# Patient Record
Sex: Male | Born: 1945 | Race: White | Hispanic: No | Marital: Married | Smoking: Current every day smoker
Health system: Southern US, Community
[De-identification: ages and names within clinical notes are randomized; demographics above are authoritative.]

## PROBLEM LIST (undated history)

## (undated) DIAGNOSIS — E7800 Pure hypercholesterolemia, unspecified: Secondary | ICD-10-CM

## (undated) DIAGNOSIS — I10 Essential (primary) hypertension: Secondary | ICD-10-CM

## (undated) DIAGNOSIS — D649 Anemia, unspecified: Secondary | ICD-10-CM

## (undated) DIAGNOSIS — F329 Major depressive disorder, single episode, unspecified: Secondary | ICD-10-CM

## (undated) DIAGNOSIS — H269 Unspecified cataract: Secondary | ICD-10-CM

## (undated) DIAGNOSIS — M1990 Unspecified osteoarthritis, unspecified site: Secondary | ICD-10-CM

## (undated) DIAGNOSIS — C61 Malignant neoplasm of prostate: Secondary | ICD-10-CM

## (undated) DIAGNOSIS — F32A Depression, unspecified: Secondary | ICD-10-CM

## (undated) DIAGNOSIS — F410 Panic disorder [episodic paroxysmal anxiety] without agoraphobia: Secondary | ICD-10-CM

## (undated) HISTORY — PX: CHOLECYSTECTOMY: SHX55

## (undated) HISTORY — DX: Unspecified osteoarthritis, unspecified site: M19.90

## (undated) HISTORY — DX: Depression, unspecified: F32.A

## (undated) HISTORY — PX: TOOTH EXTRACTION: SUR596

## (undated) HISTORY — DX: Panic disorder (episodic paroxysmal anxiety): F41.0

## (undated) HISTORY — DX: Unspecified cataract: H26.9

## (undated) HISTORY — DX: Malignant neoplasm of prostate: C61

## (undated) HISTORY — DX: Anemia, unspecified: D64.9

## (undated) HISTORY — PX: HERNIA REPAIR: SHX51

## (undated) HISTORY — DX: Pure hypercholesterolemia, unspecified: E78.00

## (undated) HISTORY — DX: Major depressive disorder, single episode, unspecified: F32.9

## (undated) HISTORY — PX: CATARACT EXTRACTION: SUR2

## (undated) HISTORY — DX: Essential (primary) hypertension: I10

---

## 2003-11-06 ENCOUNTER — Encounter: Payer: Self-pay | Admitting: Gastroenterology

## 2009-02-07 ENCOUNTER — Encounter: Payer: Self-pay | Admitting: Gastroenterology

## 2009-02-09 ENCOUNTER — Encounter: Payer: Self-pay | Admitting: Gastroenterology

## 2009-02-12 ENCOUNTER — Encounter: Payer: Self-pay | Admitting: Gastroenterology

## 2009-02-13 ENCOUNTER — Encounter: Payer: Self-pay | Admitting: Gastroenterology

## 2009-04-25 ENCOUNTER — Telehealth: Payer: Self-pay | Admitting: Gastroenterology

## 2009-04-26 ENCOUNTER — Ambulatory Visit: Payer: Self-pay | Admitting: Gastroenterology

## 2009-04-26 DIAGNOSIS — E538 Deficiency of other specified B group vitamins: Secondary | ICD-10-CM | POA: Insufficient documentation

## 2009-04-26 DIAGNOSIS — E782 Mixed hyperlipidemia: Secondary | ICD-10-CM | POA: Insufficient documentation

## 2009-04-26 DIAGNOSIS — K921 Melena: Secondary | ICD-10-CM | POA: Insufficient documentation

## 2009-04-26 DIAGNOSIS — I10 Essential (primary) hypertension: Secondary | ICD-10-CM | POA: Insufficient documentation

## 2009-04-26 DIAGNOSIS — E785 Hyperlipidemia, unspecified: Secondary | ICD-10-CM | POA: Insufficient documentation

## 2009-04-26 DIAGNOSIS — R634 Abnormal weight loss: Secondary | ICD-10-CM | POA: Insufficient documentation

## 2009-05-09 ENCOUNTER — Encounter: Payer: Self-pay | Admitting: Gastroenterology

## 2009-05-09 ENCOUNTER — Ambulatory Visit: Payer: Self-pay | Admitting: Gastroenterology

## 2009-05-11 ENCOUNTER — Encounter: Payer: Self-pay | Admitting: Gastroenterology

## 2011-11-05 ENCOUNTER — Ambulatory Visit (INDEPENDENT_AMBULATORY_CARE_PROVIDER_SITE_OTHER): Payer: MEDICARE | Admitting: Physician Assistant

## 2011-11-05 ENCOUNTER — Encounter: Payer: Self-pay | Admitting: Physician Assistant

## 2011-11-05 DIAGNOSIS — F411 Generalized anxiety disorder: Secondary | ICD-10-CM

## 2011-11-05 DIAGNOSIS — E7800 Pure hypercholesterolemia, unspecified: Secondary | ICD-10-CM

## 2011-11-05 DIAGNOSIS — D510 Vitamin B12 deficiency anemia due to intrinsic factor deficiency: Secondary | ICD-10-CM

## 2011-11-05 DIAGNOSIS — H6120 Impacted cerumen, unspecified ear: Secondary | ICD-10-CM

## 2011-11-05 DIAGNOSIS — Z0000 Encounter for general adult medical examination without abnormal findings: Secondary | ICD-10-CM

## 2011-11-05 DIAGNOSIS — I10 Essential (primary) hypertension: Secondary | ICD-10-CM

## 2011-11-05 LAB — POCT URINALYSIS DIPSTICK
Blood, UA: NEGATIVE
Ketones, UA: NEGATIVE
Leukocytes, UA: NEGATIVE
Protein, UA: NEGATIVE
pH, UA: 6

## 2011-11-05 LAB — CBC WITH DIFFERENTIAL/PLATELET
Basophils Absolute: 0 10*3/uL (ref 0.0–0.1)
Basophils Relative: 0 % (ref 0–1)
Eosinophils Absolute: 0.1 10*3/uL (ref 0.0–0.7)
Hemoglobin: 12.2 g/dL — ABNORMAL LOW (ref 13.0–17.0)
MCH: 26.3 pg (ref 26.0–34.0)
MCHC: 31.4 g/dL (ref 30.0–36.0)
Neutro Abs: 5.4 10*3/uL (ref 1.7–7.7)
Neutrophils Relative %: 70 % (ref 43–77)
Platelets: 333 10*3/uL (ref 150–400)
RDW: 16.9 % — ABNORMAL HIGH (ref 11.5–15.5)

## 2011-11-05 LAB — COMPREHENSIVE METABOLIC PANEL
AST: 21 U/L (ref 0–37)
Albumin: 4.3 g/dL (ref 3.5–5.2)
Alkaline Phosphatase: 58 U/L (ref 39–117)
Potassium: 4.1 mEq/L (ref 3.5–5.3)
Sodium: 138 mEq/L (ref 135–145)
Total Bilirubin: 0.5 mg/dL (ref 0.3–1.2)
Total Protein: 6.7 g/dL (ref 6.0–8.3)

## 2011-11-05 LAB — LIPID PANEL
HDL: 46 mg/dL (ref >39)
LDL Cholesterol: 156 mg/dL — ABNORMAL HIGH (ref 0–99)
Total CHOL/HDL Ratio: 4.7 Ratio
Triglycerides: 61 mg/dL (ref <150)
VLDL: 12 mg/dL (ref 0–40)

## 2011-11-05 MED ORDER — ROSUVASTATIN CALCIUM 20 MG PO TABS: 20.0000 mg | ORAL_TABLET | Freq: Every day | ORAL | Status: DC

## 2011-11-05 MED ORDER — ALPRAZOLAM 1 MG PO TABS: ORAL_TABLET | ORAL | Status: DC

## 2011-11-05 MED ORDER — CYANOCOBALAMIN 1000 MCG/ML IJ SOLN: INTRAMUSCULAR | Status: DC

## 2011-11-05 MED ORDER — ENALAPRIL-HYDROCHLOROTHIAZIDE 10-25 MG PO TABS: 1.0000 | ORAL_TABLET | Freq: Every day | ORAL | Status: DC

## 2011-11-05 NOTE — Progress Notes (Signed)
  Subjective:    Patient ID: Jackson Perry, male    DOB: 03/14/1946, 66 y.o.   MRN: [id number]  HPI  Here for CPE.  Doing well with B12 injections every 3 weeks.  Sometimes feels like they need to be closer together.  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 But, he is dealing with that ok now after a brief period of increased anxiety.  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  Getting a lot of enjoyment and fulfillment out of it.  Also enjoying his gardening.  He and his wife are doing well.  See scanned in form  Review of Systems  All other systems reviewed and are negative.       Objective:   Physical Exam  Nursing note and vitals reviewed. Constitutional: He is oriented to person, place, and time. He appears well-developed and well-nourished.  HENT:  Head: Normocephalic and atraumatic.  Right Ear: External ear normal.  Left Ear: External ear normal.  Nose: Nose normal.  Mouth/Throat: Oropharynx is clear and moist. No oropharyngeal exudate.       B ears were impacted with cerumen.  I was able to use currettes to successfully remove the wax.  Eyes: Conjunctivae are normal. Pupils are equal, round, and reactive to light.  Neck: Normal range of motion. Neck supple. No thyromegaly present.  Cardiovascular: Normal rate, regular rhythm and normal heart sounds.  Exam reveals no gallop and no friction rub.   No murmur heard. Pulmonary/Chest: Effort normal and breath sounds normal.  Abdominal: Soft. Bowel sounds are normal.  Genitourinary: Rectum normal, prostate normal and penis normal. Guaiac negative stool.       Small external hemorrhoids  Musculoskeletal: Normal range of motion. He exhibits no edema and no tenderness.  Lymphadenopathy:    He has no cervical adenopathy.  Neurological: He is alert and oriented to person, place, and time. He has normal reflexes. No cranial nerve deficit. Coordination normal.  Skin: Skin is warm and dry.  Psychiatric: He has a normal  mood and affect. His behavior is normal.   Results for orders placed in visit on 11/05/11  POCT URINALYSIS DIPSTICK      Component Value Range   Color, UA yellow     Clarity, UA clear     Glucose, UA neg     Bilirubin, UA neg     Ketones, UA neg     Spec Grav, UA 1.010     Blood, UA neg     pH, UA 6.0     Protein, UA neg     Urobilinogen, UA 0.2     Nitrite, UA neg     Leukocytes, UA Negative    IFOBT (OCCULT BLOOD)      Component Value Range   IFOBT Negative            Assessment & Plan:  Pernicious Anemia-checking labs.  May do injections every 17-21 days. HTN- adequate control-believe this would be better controlled with decreased alcohol intake and decreased salt in diet while continuing current meds.  Pt. Agrees to check BP out of office. Increased cholesterol-stable, continue crestor. Anxiety/insomnia-stable with xanax

## 2011-11-06 LAB — PSA: PSA: 1.64 ng/mL (ref <=4.00)

## 2011-11-08 LAB — VITAMIN D 1,25 DIHYDROXY
Vitamin D 1, 25 (OH)2 Total: 64 pg/mL (ref 18–72)
Vitamin D3 1, 25 (OH)2: 64 pg/mL

## 2011-11-09 ENCOUNTER — Telehealth: Payer: Self-pay | Admitting: Physician Assistant

## 2011-11-09 NOTE — Telephone Encounter (Signed)
Spoke with patient.  Re start cholesterol meds.  Hemoglobin a little low, B12 middle range normal.  Ok to do B12 injections closer together ~every 17 days.  See me in 9 months.

## 2012-01-28 ENCOUNTER — Ambulatory Visit (INDEPENDENT_AMBULATORY_CARE_PROVIDER_SITE_OTHER): Payer: Medicare Other | Admitting: Family Medicine

## 2012-01-28 VITALS — BP 112/58 | HR 76 | Temp 97.4°F | Resp 16 | Ht 67.5 in | Wt 151.0 lb

## 2012-01-28 DIAGNOSIS — M25579 Pain in unspecified ankle and joints of unspecified foot: Secondary | ICD-10-CM

## 2012-01-28 DIAGNOSIS — L84 Corns and callosities: Secondary | ICD-10-CM

## 2012-01-28 NOTE — Progress Notes (Signed)
  Patient Name: Jackson Perry Date of Birth: February 08, 1946 Medical Record Number: [id number] Gender: male Date of Encounter: 01/28/2012  History of Present Illness:  Jackson Perry is a 66 y.o. very pleasant male patient who presents with the following:  There is a corn or callus on his right 2nd toe where it rubs against the great toe.  He has tried OTC corn medications but has not resolved the problem.  The corn rubs against the great toe and is painful.     Patient Active Problem List  Diagnoses  . B12 DEFICIENCY  . HYPERLIPIDEMIA  . HYPERTENSION  . HEMOCCULT POSITIVE STOOL  . LOSS OF WEIGHT   No past medical history on file. No past surgical history on file. History  Substance Use Topics  . Smoking status: Former Smoker    Types: Cigarettes    Quit date: 11/05/2003  . Smokeless tobacco: Not on file  . Alcohol Use: Not on file   No family history on file. Allergies  Allergen Reactions  . Bupropion Hcl     Medication list has been reviewed and updated.  Review of Systems: As per HPI- otherwise negative.   Physical Examination: Filed Vitals:   01/28/12 1614  BP: 112/58  Pulse: 76  Temp: 97.4 F (36.3 C)  TempSrc: Oral  Resp: 16  Height: 5' 7.5" (1.715 m)  Weight: 151 lb (68.493 kg)    Body mass index is 23.30 kg/(m^2).   GEN: WDWN, NAD, Non-toxic, Alert & Oriented x 3 HEENT: Atraumatic, Normocephalic.  Ears and Nose: No external deformity. EXTR: No clubbing/cyanosis/edema NEURO: Normal gait.  PSYCH: Normally interactive. Conversant. Not depressed or anxious appearing.  Calm demeanor.  Right 2nd toe- there is a corn with thick callus on the medial sufrace.  Pared with a scalpel through dead tissue only- no bleeding- and then applied liquid nitrogen X3.  Also noted that the 2nd toe is becoming hyperextended and starting to cross over the top of the 3rd toe  Assessment and Plan: 1. Corn of toe    Pared and then applied liquid nitrogen to corn.   recommended that he consult with a podiatrist about the crossing of his toes as this may progress and cause problems later.   Please come back if corn needs further treatment, or if any signs of infection develop

## 2012-02-03 ENCOUNTER — Other Ambulatory Visit: Payer: Self-pay | Admitting: Physician Assistant

## 2012-02-03 MED ORDER — ENALAPRIL-HYDROCHLOROTHIAZIDE 10-25 MG PO TABS: 1.0000 | ORAL_TABLET | Freq: Every day | ORAL | Status: DC

## 2012-02-04 ENCOUNTER — Other Ambulatory Visit: Payer: Self-pay | Admitting: Family Medicine

## 2012-02-04 MED ORDER — ENALAPRIL-HYDROCHLOROTHIAZIDE 10-25 MG PO TABS: 1.0000 | ORAL_TABLET | Freq: Every day | ORAL | Status: DC

## 2012-04-29 ENCOUNTER — Other Ambulatory Visit: Payer: Self-pay | Admitting: Family Medicine

## 2012-04-29 MED ORDER — ALPRAZOLAM 1 MG PO TABS: ORAL_TABLET | ORAL | Status: DC

## 2012-12-08 ENCOUNTER — Telehealth: Payer: Self-pay

## 2012-12-08 MED ORDER — ALPRAZOLAM 1 MG PO TABS: ORAL_TABLET | ORAL | Status: DC

## 2012-12-08 MED ORDER — ROSUVASTATIN CALCIUM 20 MG PO TABS: 20.0000 mg | ORAL_TABLET | Freq: Every day | ORAL | Status: DC

## 2012-12-08 MED ORDER — ENALAPRIL-HYDROCHLOROTHIAZIDE 10-25 MG PO TABS: 1.0000 | ORAL_TABLET | Freq: Every day | ORAL | Status: DC

## 2012-12-08 MED ORDER — CYANOCOBALAMIN 1000 MCG/ML IJ SOLN: INTRAMUSCULAR | Status: DC

## 2012-12-08 NOTE — Telephone Encounter (Signed)
Xanax rx printed.  Pt will need to discuss with Dr. Clelia Croft at physical.  May call for 1 RF if needed before appointment

## 2012-12-08 NOTE — Telephone Encounter (Signed)
Have sent in meds except Xanax, pended please advise.

## 2012-12-08 NOTE — Telephone Encounter (Signed)
Faxed, then had to res[facility] them to [facility], sent to [facility] in error. Cancelled at [employer].

## 2012-12-08 NOTE — Telephone Encounter (Signed)
Pt was scheduled to see angela mcclung for a CPE, but has been cancelled due to her leaving. CPE has been rescheduled for 01/28/13 with Dr Clelia Croft. But pt states he needs refills on all meds, until he can get in for CPE.  Needs refill on Crestor, veseretic, xanax, B-12 and syringes.  Pt uses [facility]

## 2012-12-09 ENCOUNTER — Telehealth: Payer: Self-pay

## 2012-12-09 ENCOUNTER — Other Ambulatory Visit: Payer: Self-pay | Admitting: Radiology

## 2012-12-09 MED ORDER — ROSUVASTATIN CALCIUM 20 MG PO TABS: 20.0000 mg | ORAL_TABLET | Freq: Every day | ORAL | Status: DC

## 2012-12-09 MED ORDER — CYANOCOBALAMIN 1000 MCG/ML IJ SOLN: INTRAMUSCULAR | Status: DC

## 2012-12-09 NOTE — Telephone Encounter (Signed)
PT STATES HE WAS GIVEN A SCRIPT FOR THE B-12 INJECTION AND CANNOT FIND A PHARMACY THAT HAVE IT. HE GOES TO [facility] AND WAS TOLD THEY WERE OUT AND WANTED HIM TO CALL us PLEASE CALL [phone]

## 2012-12-09 NOTE — Telephone Encounter (Signed)
Sent in for him to [facility], if they do not have it he can come here.

## 2012-12-29 ENCOUNTER — Encounter: Payer: Medicare Other | Admitting: Physician Assistant

## 2013-01-19 ENCOUNTER — Telehealth: Payer: Self-pay

## 2013-01-19 MED ORDER — ENALAPRIL-HYDROCHLOROTHIAZIDE 10-25 MG PO TABS: 1.0000 | ORAL_TABLET | Freq: Every day | ORAL | Status: DC

## 2013-01-19 MED ORDER — CYANOCOBALAMIN 1000 MCG/ML IJ SOLN: INTRAMUSCULAR | Status: DC

## 2013-01-19 MED ORDER — ROSUVASTATIN CALCIUM 20 MG PO TABS: 20.0000 mg | ORAL_TABLET | Freq: Every day | ORAL | Status: DC

## 2013-01-19 NOTE — Telephone Encounter (Signed)
Sent these in called patient to advise.

## 2013-01-19 NOTE — Telephone Encounter (Signed)
Former pt of Marylene Land (had appt with her for April but we cancelled). Rescheduled with Dr. Clelia Croft for May 23. I had to reschedule him for 5/30 (CPE) as her clinic is closing early on 5/23. Needs meds to get thru til 5/30 B12 (10mg  bottle please, the 1 mg more expensive) BP med Crestor  [facility] on [address]  Please call pt when done to confirm  [phone]  Thanks.

## 2013-01-28 ENCOUNTER — Encounter: Payer: Medicare Other | Admitting: Family Medicine

## 2013-02-04 ENCOUNTER — Encounter: Payer: Self-pay | Admitting: Family Medicine

## 2013-02-04 ENCOUNTER — Ambulatory Visit (INDEPENDENT_AMBULATORY_CARE_PROVIDER_SITE_OTHER): Payer: Medicare Other | Admitting: Family Medicine

## 2013-02-04 VITALS — BP 132/73 | HR 77 | Temp 98.7°F | Resp 16 | Ht 68.5 in | Wt 149.0 lb

## 2013-02-04 DIAGNOSIS — IMO0001 Reserved for inherently not codable concepts without codable children: Secondary | ICD-10-CM

## 2013-02-04 DIAGNOSIS — E538 Deficiency of other specified B group vitamins: Secondary | ICD-10-CM

## 2013-02-04 DIAGNOSIS — Z0000 Encounter for general adult medical examination without abnormal findings: Secondary | ICD-10-CM

## 2013-02-04 DIAGNOSIS — E785 Hyperlipidemia, unspecified: Secondary | ICD-10-CM

## 2013-02-04 DIAGNOSIS — R5381 Other malaise: Secondary | ICD-10-CM

## 2013-02-04 DIAGNOSIS — Z23 Encounter for immunization: Secondary | ICD-10-CM

## 2013-02-04 DIAGNOSIS — Z139 Encounter for screening, unspecified: Secondary | ICD-10-CM

## 2013-02-04 DIAGNOSIS — Z125 Encounter for screening for malignant neoplasm of prostate: Secondary | ICD-10-CM

## 2013-02-04 DIAGNOSIS — G4700 Insomnia, unspecified: Secondary | ICD-10-CM

## 2013-02-04 LAB — POCT UA - MICROSCOPIC ONLY
Bacteria, U Microscopic: NEGATIVE
WBC, Ur, HPF, POC: NEGATIVE

## 2013-02-04 LAB — POCT URINALYSIS DIPSTICK
Bilirubin, UA: NEGATIVE
Blood, UA: NEGATIVE
Glucose, UA: NEGATIVE
Spec Grav, UA: 1.02

## 2013-02-04 LAB — VITAMIN B12: Vitamin B-12: 565 pg/mL (ref 211–911)

## 2013-02-04 LAB — LIPID PANEL
HDL: 48 mg/dL (ref >39)
LDL Cholesterol: 166 mg/dL — ABNORMAL HIGH (ref 0–99)
Total CHOL/HDL Ratio: 4.8 Ratio
Triglycerides: 69 mg/dL (ref <150)
VLDL: 14 mg/dL (ref 0–40)

## 2013-02-04 LAB — CBC WITH DIFFERENTIAL/PLATELET
Eosinophils Relative: 1 % (ref 0–5)
HCT: 39.1 % (ref 39.0–52.0)
Hemoglobin: 12.8 g/dL — ABNORMAL LOW (ref 13.0–17.0)
Lymphocytes Relative: 20 % (ref 12–46)
MCV: 82.8 fL (ref 78.0–100.0)
Monocytes Absolute: 0.4 10*3/uL (ref 0.1–1.0)
Monocytes Relative: 6 % (ref 3–12)
Neutro Abs: 4.8 10*3/uL (ref 1.7–7.7)
RDW: 16.5 % — ABNORMAL HIGH (ref 11.5–15.5)
WBC: 6.6 10*3/uL (ref 4.0–10.5)

## 2013-02-04 LAB — IFOBT (OCCULT BLOOD): IFOBT: NEGATIVE

## 2013-02-04 LAB — COMPREHENSIVE METABOLIC PANEL
AST: 17 U/L (ref 0–37)
Alkaline Phosphatase: 59 U/L (ref 39–117)
Glucose, Bld: 102 mg/dL — ABNORMAL HIGH (ref 70–99)
Sodium: 138 mEq/L (ref 135–145)
Total Bilirubin: 0.5 mg/dL (ref 0.3–1.2)
Total Protein: 6.7 g/dL (ref 6.0–8.3)

## 2013-02-04 LAB — TSH: TSH: 1.754 u[IU]/mL (ref 0.350–4.500)

## 2013-02-04 MED ORDER — ENALAPRIL-HYDROCHLOROTHIAZIDE 10-25 MG PO TABS: 1.0000 | ORAL_TABLET | Freq: Every day | ORAL | Status: DC

## 2013-02-04 MED ORDER — ALPRAZOLAM 1 MG PO TABS: ORAL_TABLET | ORAL | Status: DC

## 2013-02-04 MED ORDER — CYANOCOBALAMIN 1000 MCG/ML IJ SOLN: INTRAMUSCULAR | Status: DC

## 2013-02-04 NOTE — Patient Instructions (Addendum)

## 2013-02-04 NOTE — Progress Notes (Addendum)
Subjective:    Jackson Perry is a 67 y.o. male who presents for Medicare Annual/Subsequent preventive examination.   Preventive Screening-Counseling & Management  Tobacco History  Smoking status  . Former Smoker  . Types: Cigarettes  . Quit date: 11/05/2003  Smokeless tobacco  . Not on file    Problems Prior to Visit 1. Stopped taking Crestor - was having muscle spasms in legs at night - had been on the crestor for a long time but when he stopped it his symptoms went away. He stopped in 6-8 wks ago. 2. B12 def - feels like it is not doing job  - lightheadedness, tiredness and weakness in legs, Very exhusted he can't move so sometimes needing to take an injection early. 3.  bilateral pelvic pains - now resolved after having a firm BM -- feels occ LLQ presure - esp after eating. 4.. H/o shingles - long time ago, has not had shingles vaccine.  Current Problems (verified) Patient Active Problem List   Diagnosis Date Noted  . B12 DEFICIENCY 04/26/2009  . HYPERLIPIDEMIA 04/26/2009  . HYPERTENSION 04/26/2009  . HEMOCCULT POSITIVE STOOL 04/26/2009  . LOSS OF WEIGHT 04/26/2009    Medications Prior to Visit Current Outpatient Prescriptions on File Prior to Visit  Medication Sig Dispense Refill  . ALPRAZolam (XANAX) 1 MG tablet 1/2-1 tablet hs as needed.  30 tablet  0  . aspirin 81 MG tablet Take 81 mg by mouth daily.      . cyanocobalamin (,VITAMIN B-12,) 1000 MCG/ML injection Inject 1 ml every 17-21 days. Please dispense enough syringes and needles.  10 mL  0  . enalapril-hydrochlorothiazide (VASERETIC) 10-25 MG per tablet Take 1 tablet by mouth daily.  30 tablet  0  . rosuvastatin (CRESTOR) 20 MG tablet Take 1 tablet (20 mg total) by mouth daily. Pt takes 1/2 tablet daily.  15 tablet  0   No current facility-administered medications on file prior to visit.    Current Medications (verified) Current Outpatient Prescriptions  Medication Sig Dispense Refill  . ALPRAZolam (XANAX)  1 MG tablet 1/2-1 tablet hs as needed.  30 tablet  0  . aspirin 81 MG tablet Take 81 mg by mouth daily.      . cyanocobalamin (,VITAMIN B-12,) 1000 MCG/ML injection Inject 1 ml every 17-21 days. Please dispense enough syringes and needles.  10 mL  0  . enalapril-hydrochlorothiazide (VASERETIC) 10-25 MG per tablet Take 1 tablet by mouth daily.  30 tablet  0  . rosuvastatin (CRESTOR) 20 MG tablet Take 1 tablet (20 mg total) by mouth daily. Pt takes 1/2 tablet daily.  15 tablet  0   No current facility-administered medications for this visit.     Allergies (verified) Bupropion hcl   PAST HISTORY  Family History No family history on file.  Social History History  Substance Use Topics  . Smoking status: Former Smoker    Types: Cigarettes    Quit date: 11/05/2003  . Smokeless tobacco: Not on file  . Alcohol Use: Not on file    Are there smokers in your home (other than you)?  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  Depression Screen (Note: if answer to either of the following is "Yes", a more complete depression screening is indicated)   Q1: Over  the past two weeks, have you felt down, depressed or hopeless? No  Q2: Over the past two weeks, have you felt little interest or pleasure in doing things? No  Have you lost interest or pleasure in daily life? No  Do you often feel hopeless? No  Do you cry easily over simple problems? No  Activities of Daily Living In your present state of health, do you have any difficulty performing the following activities?:  Driving? No Managing money?  No Feeding yourself? No Getting from bed to chair? No Climbing a flight of stairs? No Preparing food and eating?: No Bathing or showering? No Getting dressed: No Getting  to the toilet? No Using the toilet:No Moving around from place to place: No In the past year have you fallen or had a near fall?:No   Are you sexually active?  Yes  Do you have more than one partner?  No  Hearing Difficulties: No Do you often ask people to speak up or repeat themselves? No Do you experience ringing or noises in your ears? No Do you have difficulty understanding soft or whispered voices? No   Do you feel that you have a problem with memory? No  Do you often misplace items? No  Do you feel safe at home?  Yes  Cognitive Testing  Alert? Yes  Normal Appearance?Yes  Oriented to person? Yes  Place? Yes   Time? Yes  Recall of three objects?  Yes  Can perform simple calculations? Yes  Displays appropriate judgment?Yes  Can read the correct time from a watch face?Yes   Advanced Directives have been discussed with the patient? Yes   List the Names of Other Physician/Practitioners you currently use: 1.  none  Indicate any recent Medical Services you may have received from other than [facility] providers in the past year (date may be approximate).   There is no immunization history on file for this patient.  Screening Tests Health Maintenance  Topic Date Due  . Tetanus/tdap  04/24/1964  . Colonoscopy  04/25/1995  . Zostavax  04/24/2005  . Pneumococcal Polysaccharide Vaccine Age 68 And Over  04/24/2010  . Influenza Vaccine  05/09/2013    All answers were reviewed with the patient and necessary referrals were made:  Zuleyma Scharf, MD   02/04/2013   History reviewed: allergies, current medications, past family history, past medical history, past social history, past surgical history and problem list  Review of Systems General/Constitutional: positive for appetite change and fatigue Genitourinary:positive for flank pain and urinary frequency Musculoskeletal:positive for myalgias Neurological: positive for dizziness and lightheadedness    Objective:     Vision by  Snellen chart: right eye:20/25, left eye:20/30 Blood pressure 132/73, pulse 77, temperature 98.7 F (37.1 C), resp. rate 16, height 5' 8.5" (1.74 m), weight 149 lb (67.586 kg). Body mass index is 22.32 kg/(m^2).  BP 132/73  Pulse 77  Temp(Src) 98.7 F (37.1 C)  Resp 16  Ht 5' 8.5" (1.74 m)  Wt 149 lb (67.586 kg)  BMI 22.32 kg/m2  General Appearance:    Alert, cooperative, no distress, appears stated age  Head:    Normocephalic, without obvious abnormality, atraumatic  Eyes:    PERRL, conjunctiva/corneas clear, EOM's intact, fundi    benign, both eyes       Ears:    Normal TM's and external ear canals, both ears  Nose:   Nares normal, septum midline, mucosa normal, no drainage    or sinus tenderness  Throat:   Lips, mucosa,  and tongue normal; teeth and gums normal  Neck:   Supple, symmetrical, trachea midline, no adenopathy;       thyroid:  No enlargement/tenderness/nodules; no carotid   bruit or JVD  Back:     Symmetric, no curvature, ROM normal, no CVA tenderness  Lungs:     Clear to auscultation bilaterally, respirations unlabored  Chest wall:    No tenderness or deformity  Heart:    Regular rate and rhythm, S1 and S2 normal, no murmur, rub   or gallop  Abdomen:     Soft, non-tender, bowel sounds active all four quadrants,    no masses, no organomegaly  Genitalia:    Normal male without lesion, discharge or tenderness.  Left reducible inguinal hernia.  Rectal:    Normal tone, normal prostate, no masses or tenderness;   guaiac negative stool  Extremities:   Extremities normal, atraumatic, no cyanosis or edema  Pulses:   2+ and symmetric all extremities  Skin:   Skin color, texture, turgor normal, no rashes or lesions  Lymph nodes:   Cervical, supraclavicular, and axillary nodes normal  Neurologic:   CNII-XII intact. Normal strength, sensation and reflexes      throughout       Assessment:     CPE and annual medicare wellness exam done today      Plan:     During  the course of the visit the patient was educated and counseled about appropriate screening and preventive services including:    Pneumococcal vaccine - done today  Td vaccine - done today  Screening electrocardiogram - done at prev physical and was abnml per pt  Prostate cancer screening - done today  Colorectal cancer screening - done 05/09/2009 - recheck in 05/2014 due to hyperplastic polyps  Advanced directives: power of attorney for healthcare on file rx given for zostavax.  Diet review for nutrition referral? Yes ____  Not Indicated __X__  1. HPL - will do trial off of crestor as prior lipids well controlled and pt's only risk factors are age, male, and well controlled HTN. Crestor was causing myalgias. Recheck flp in 4 mos to see if needs to retry other statin.  2. Vit b12 def - OK to increase self-administered injection to q2 wks as he as been feeling level is low and had to do that w/ last inj - recheck level today, cons checking methylmalonic acid level.  If shortage cont, may have to do trial of oral.  3. Anxiety - well controlled on rare prn xanax  4. Flank/pelvic pain - rec abdominal US if recurs.   Patient Instructions (the written plan) was given to the patient.  Medicare Attestation I have personally reviewed: The patient's medical and social history Their use of alcohol, tobacco or illicit drugs Their current medications and supplements The patient's functional ability including ADLs,fall risks, home safety risks, cognitive, and hearing and visual impairment Diet and physical activities Evidence for depression or mood disorders  The patient's weight, height, BMI, and visual acuity have been recorded in the chart.  I have made referrals, counseling, and provided education to the patient based on review of the above and I have provided the patient with a written personalized care plan for preventive services.     Jackson Sorenson, MD   02/04/2013

## 2013-02-04 NOTE — Progress Notes (Deleted)
  Subjective:    Patient ID: Jackson Perry, male    DOB: September 30, 1945, 67 y.o.   MRN: [id number]  HPI    Review of Systems  Constitutional: Negative.   HENT: Negative.   Eyes: Negative.   Respiratory: Negative.   Cardiovascular: Negative.   Gastrointestinal: Negative.   Endocrine: Negative.   Genitourinary: Positive for flank pain.  Musculoskeletal: Positive for myalgias.  Neurological: Positive for dizziness and light-headedness.  Hematological: Negative.   Psychiatric/Behavioral: Negative.        Objective:   Physical Exam        Assessment & Plan:

## 2013-02-09 LAB — METHYLMALONIC ACID, SERUM: Methylmalonic Acid, Quant: 0.09 umol/L (ref <0.40)

## 2013-02-20 ENCOUNTER — Other Ambulatory Visit: Payer: Self-pay | Admitting: Family Medicine

## 2013-02-20 DIAGNOSIS — E785 Hyperlipidemia, unspecified: Secondary | ICD-10-CM

## 2013-02-20 MED ORDER — PRAVASTATIN SODIUM 40 MG PO TABS: 40.0000 mg | ORAL_TABLET | Freq: Every day | ORAL | Status: DC

## 2013-03-02 ENCOUNTER — Ambulatory Visit
Admission: RE | Admit: 2013-03-02 | Discharge: 2013-03-02 | Disposition: A | Discharge status: Home / Self Care | Payer: Medicare Other | Source: Ambulatory Visit | Attending: Family Medicine | Admitting: Family Medicine

## 2013-03-02 DIAGNOSIS — Z0000 Encounter for general adult medical examination without abnormal findings: Secondary | ICD-10-CM

## 2013-07-14 ENCOUNTER — Other Ambulatory Visit: Payer: Self-pay

## 2013-12-05 ENCOUNTER — Ambulatory Visit (INDEPENDENT_AMBULATORY_CARE_PROVIDER_SITE_OTHER): Payer: Medicare Other | Admitting: Emergency Medicine

## 2013-12-05 VITALS — BP 108/60 | HR 74 | Temp 98.1°F | Resp 16 | Ht 68.0 in | Wt 151.6 lb

## 2013-12-05 DIAGNOSIS — Z808 Family history of malignant neoplasm of other organs or systems: Secondary | ICD-10-CM

## 2013-12-05 DIAGNOSIS — G2581 Restless legs syndrome: Secondary | ICD-10-CM

## 2013-12-05 DIAGNOSIS — B029 Zoster without complications: Secondary | ICD-10-CM

## 2013-12-05 NOTE — Patient Instructions (Signed)
Shingles Shingles (herpes zoster) is an infection that is caused by the same virus that causes chickenpox (varicella). The infection causes a painful skin rash and fluid-filled blisters, which eventually break open, crust over, and heal. It may occur in any area of the body, but it usually affects only one side of the body or face. The pain of shingles usually lasts about 1 month. However, some people with shingles may develop long-term (chronic) pain in the affected area of the body. Shingles often occurs many years after the person had chickenpox. It is more common:  In people older than 50 years.  In people with weakened immune systems, such as those with HIV, AIDS, or cancer.  In people taking medicines that weaken the immune system, such as transplant medicines.  In people under great stress. CAUSES  Shingles is caused by the varicella zoster virus (VZV), which also causes chickenpox. After a person is infected with the virus, it can remain in the person's body for years in an inactive state (dormant). To cause shingles, the virus reactivates and breaks out as an infection in a nerve root. The virus can be spread from person to person (contagious) through contact with open blisters of the shingles rash. It will only spread to people who have not had chickenpox. When these people are exposed to the virus, they may develop chickenpox. They will not develop shingles. Once the blisters scab over, the person is no longer contagious and cannot spread the virus to others. SYMPTOMS  Shingles shows up in stages. The initial symptoms may be pain, itching, and tingling in an area of the skin. This pain is usually described as burning, stabbing, or throbbing.In a few days or weeks, a painful red rash will appear in the area where the pain, itching, and tingling were felt. The rash is usually on one side of the body in a band or belt-like pattern. Then, the rash usually turns into fluid-filled blisters. They  will scab over and dry up in approximately 2 3 weeks. Flu-like symptoms may also occur with the initial symptoms, the rash, or the blisters. These may include:  Fever.  Chills.  Headache.  Upset stomach. DIAGNOSIS  Your caregiver will perform a skin exam to diagnose shingles. Skin scrapings or fluid samples may also be taken from the blisters. This sample will be examined under a microscope or sent to a lab for further testing. TREATMENT  There is no specific cure for shingles. Your caregiver will likely prescribe medicines to help you manage the pain, recover faster, and avoid long-term problems. This may include antiviral drugs, anti-inflammatory drugs, and pain medicines. HOME CARE INSTRUCTIONS   Take a cool bath or apply cool compresses to the area of the rash or blisters as directed. This may help with the pain and itching.   Only take over-the-counter or prescription medicines as directed by your caregiver.   Rest as directed by your caregiver.  Keep your rash and blisters clean with mild soap and cool water or as directed by your caregiver.  Do not pick your blisters or scratch your rash. Apply an anti-itch cream or numbing creams to the affected area as directed by your caregiver.  Keep your shingles rash covered with a loose bandage (dressing).  Avoid skin contact with:  Babies.   Pregnant women.   Children with eczema.   Elderly people with transplants.   People with chronic illnesses, such as leukemia or AIDS.   Wear loose-fitting clothing to help ease   the pain of material rubbing against the rash.  Keep all follow-up appointments with your caregiver.If the area involved is on your face, you may receive a referral for follow-up to a specialist, such as an eye doctor (ophthalmologist) or an ear, nose, and throat (ENT) doctor. Keeping all follow-up appointments will help you avoid eye complications, chronic pain, or disability.  SEEK IMMEDIATE MEDICAL  CARE IF:   You have facial pain, pain around the eye area, or loss of feeling on one side of your face.  You have ear pain or ringing in your ear.  You have loss of taste.  Your pain is not relieved with prescribed medicines.   Your redness or swelling spreads.   You have more pain and swelling.  Your condition is worsening or has changed.   You have a feveror persistent symptoms for more than 2 3 days.  You have a fever and your symptoms suddenly get worse. MAKE SURE YOU:  Understand these instructions.  Will watch your condition.  Will get help right away if you are not doing well or get worse. Document Released: 08/25/2005 Document Revised: 05/19/2012 Document Reviewed: 04/08/2012 [employer] Patient Information 2014 [employer].  

## 2013-12-05 NOTE — Progress Notes (Signed)
[facility] [address] [place] [address] [phone]  Date:  12/05/2013   Name:  Jackson Perry   DOB:  Feb 22, 1946   MRN:  [id number]  PCP:  Mikey Kirschner    Chief Complaint: Rash   History of Present Illness:  Jackson Perry is a 68 y.o. very pleasant male patient who presents with the following:  Brothers have been diagnosed with malignant melanoma.  Requests referral.  Has a rash that is burning and pruritic on left upper back for over a week. Initially vesicular and now not.  No fever or chills.  No shingles shot.  No improvement with over the counter medications or other home remedies. Marland Kitchenjds   Patient Active Problem List   Diagnosis Date Noted  . B12 DEFICIENCY 04/26/2009  . HYPERLIPIDEMIA 04/26/2009  . HYPERTENSION 04/26/2009  . HEMOCCULT POSITIVE STOOL 04/26/2009  . LOSS OF WEIGHT 04/26/2009    Past Medical History  Diagnosis Date  . Depression   . Anemia     Past Surgical History  Procedure Laterality Date  . Cholecystectomy      History  Substance Use Topics  . Smoking status: Former Smoker    Types: Cigarettes    Quit date: 11/05/2003  . Smokeless tobacco: Not on file  . Alcohol Use: Not on file    Family History  Problem Relation Age of Onset  . Heart disease Father   . Diabetes Father   . Cancer Father     Allergies  Allergen Reactions  . Bupropion Hcl     Medication list has been reviewed and updated.  Current Outpatient Prescriptions on File Prior to Visit  Medication Sig Dispense Refill  . aspirin 81 MG tablet Take 81 mg by mouth daily.      . cyanocobalamin (,VITAMIN B-12,) 1000 MCG/ML injection Inject 1 ml every 14 days. Please dispense enough syringes and needles.  10 mL  0  . enalapril-hydrochlorothiazide (VASERETIC) 10-25 MG per tablet Take 1 tablet by mouth daily.  90 tablet  3  . pravastatin (PRAVACHOL) 40 MG tablet Take 1 tablet (40 mg total) by mouth daily.  90 tablet  1   No current  facility-administered medications on file prior to visit.    Review of Systems:  As per HPI, otherwise negative.    Physical Examination: Filed Vitals:   12/05/13 1555  BP: 108/60  Pulse: 74  Temp: 98.1 F (36.7 C)  Resp: 16   Filed Vitals:   12/05/13 1555  Height: 5\' 8"  (1.727 m)  Weight: 151 lb 9.6 oz (68.765 kg)   Body mass index is 23.06 kg/(m^2). Ideal Body Weight: Weight in (lb) to have BMI = 25: 164.1   GEN: WDWN, NAD, Non-toxic, Alert & Oriented x 3 HEENT: Atraumatic, Normocephalic.  Ears and Nose: No external deformity. EXTR: No clubbing/cyanosis/edema NEURO: Normal gait.  PSYCH: Normally interactive. Conversant. Not depressed or anxious appearing.  Calm demeanor.  SKIN:  Rash on left upper back consistent with shingles   Assessment and Plan: Shingles Shingles vaccine Too late to start valtrex  Signed,  Ellison Carwin, MD

## 2013-12-05 NOTE — Addendum Note (Signed)
Addended by: Roselee Culver on: 12/05/2013 05:47 PM   Modules accepted: Orders

## 2014-01-09 ENCOUNTER — Other Ambulatory Visit: Payer: Self-pay | Admitting: Family Medicine

## 2014-02-10 ENCOUNTER — Encounter: Payer: Self-pay | Admitting: Family Medicine

## 2014-02-10 ENCOUNTER — Ambulatory Visit (INDEPENDENT_AMBULATORY_CARE_PROVIDER_SITE_OTHER): Payer: Medicare Other | Admitting: Family Medicine

## 2014-02-10 VITALS — BP 130/82 | HR 80 | Temp 98.2°F | Resp 20 | Ht 68.0 in | Wt 152.8 lb

## 2014-02-10 DIAGNOSIS — Z125 Encounter for screening for malignant neoplasm of prostate: Secondary | ICD-10-CM

## 2014-02-10 DIAGNOSIS — Z0000 Encounter for general adult medical examination without abnormal findings: Secondary | ICD-10-CM

## 2014-02-10 DIAGNOSIS — B354 Tinea corporis: Secondary | ICD-10-CM

## 2014-02-10 DIAGNOSIS — R21 Rash and other nonspecific skin eruption: Secondary | ICD-10-CM

## 2014-02-10 DIAGNOSIS — E785 Hyperlipidemia, unspecified: Secondary | ICD-10-CM

## 2014-02-10 DIAGNOSIS — I10 Essential (primary) hypertension: Secondary | ICD-10-CM

## 2014-02-10 DIAGNOSIS — E538 Deficiency of other specified B group vitamins: Secondary | ICD-10-CM

## 2014-02-10 LAB — COMPLETE METABOLIC PANEL WITH GFR
ALT: 16 U/L (ref 0–53)
AST: 16 U/L (ref 0–37)
Albumin: 3.9 g/dL (ref 3.5–5.2)
Alkaline Phosphatase: 58 U/L (ref 39–117)
BUN: 12 mg/dL (ref 6–23)
CALCIUM: 8.9 mg/dL (ref 8.4–10.5)
CO2: 25 meq/L (ref 19–32)
CREATININE: 0.92 mg/dL (ref 0.50–1.35)
Chloride: 103 mEq/L (ref 96–112)
GFR, Est Non African American: 85 mL/min
Glucose, Bld: 99 mg/dL (ref 70–99)
Potassium: 4.3 mEq/L (ref 3.5–5.3)
Sodium: 140 mEq/L (ref 135–145)
Total Bilirubin: 0.4 mg/dL (ref 0.2–1.2)
Total Protein: 6.5 g/dL (ref 6.0–8.3)

## 2014-02-10 LAB — CBC WITH DIFFERENTIAL/PLATELET
BASOS ABS: 0 10*3/uL (ref 0.0–0.1)
Basophils Relative: 0 % (ref 0–1)
EOS ABS: 0.1 10*3/uL (ref 0.0–0.7)
Eosinophils Relative: 1 % (ref 0–5)
HCT: 39 % (ref 39.0–52.0)
Hemoglobin: 12.7 g/dL — ABNORMAL LOW (ref 13.0–17.0)
LYMPHS PCT: 18 % (ref 12–46)
Lymphs Abs: 1.6 10*3/uL (ref 0.7–4.0)
MCH: 27.8 pg (ref 26.0–34.0)
MCHC: 32.6 g/dL (ref 30.0–36.0)
MCV: 85.3 fL (ref 78.0–100.0)
Monocytes Absolute: 0.5 10*3/uL (ref 0.1–1.0)
Monocytes Relative: 6 % (ref 3–12)
NEUTROS PCT: 75 % (ref 43–77)
Neutro Abs: 6.8 10*3/uL (ref 1.7–7.7)
PLATELETS: 305 10*3/uL (ref 150–400)
RBC: 4.57 MIL/uL (ref 4.22–5.81)
RDW: 16.4 % — ABNORMAL HIGH (ref 11.5–15.5)
WBC: 9.1 10*3/uL (ref 4.0–10.5)

## 2014-02-10 LAB — POCT SKIN KOH: Skin KOH, POC: NEGATIVE

## 2014-02-10 LAB — LIPID PANEL
Cholesterol: 180 mg/dL (ref 0–200)
HDL: 35 mg/dL — AB (ref >39)
LDL Cholesterol: 133 mg/dL — ABNORMAL HIGH (ref 0–99)
Total CHOL/HDL Ratio: 5.1 Ratio
Triglycerides: 58 mg/dL (ref <150)
VLDL: 12 mg/dL (ref 0–40)

## 2014-02-10 MED ORDER — CYANOCOBALAMIN 1000 MCG/ML IJ SOLN: INTRAMUSCULAR | Status: DC

## 2014-02-10 MED ORDER — ENALAPRIL-HYDROCHLOROTHIAZIDE 10-25 MG PO TABS: 1.0000 | ORAL_TABLET | Freq: Every day | ORAL | Status: DC

## 2014-02-10 MED ORDER — TERBINAFINE HCL 250 MG PO TABS
250.0000 mg | ORAL_TABLET | Freq: Every day | ORAL | Status: DC
Start: 2014-02-10 — End: 2015-07-10

## 2014-02-10 MED ORDER — ALPRAZOLAM 1 MG PO TABS: 1.0000 mg | ORAL_TABLET | Freq: Two times a day (BID) | ORAL | Status: DC | PRN

## 2014-02-10 MED ORDER — KETOCONAZOLE 2 % EX CREA: 1.0000 "application " | TOPICAL_CREAM | Freq: Every day | CUTANEOUS | Status: DC

## 2014-02-10 NOTE — Patient Instructions (Addendum)
You are do for the new Prevnar vaccine - but we ran out of it today.  We will plan to give it to you on your next routine OV or when you come in for your flu shot this fall. You will be due for your colonoscopy in September - your GI doctor's office (Dr. Sharlett Iles) should contact you to schedule this soon. Go get your shingles vaccine at the pharmacy!  If the fungal treatment doesn't work, then we should try a stronger steroid treatment and consider getting you back in to see the dermatologist.  Keeping you healthy  Get these tests  Blood pressure- Have your blood pressure checked once a year by your healthcare provider.  Normal blood pressure is 120/80  Weight- Have your body mass index (BMI) calculated to screen for obesity.  BMI is a measure of body fat based on height and weight. You can also calculate your own BMI at ViewBanking.si.  Cholesterol- Have your cholesterol checked every year.  Diabetes- Have your blood sugar checked regularly if you have high blood pressure, high cholesterol, have a family history of diabetes or if you are overweight.  Screening for Colon Cancer- Colonoscopy starting at age 5.  Screening may begin sooner depending on your family history and other health conditions. Follow up colonoscopy as directed by your Gastroenterologist.  Screening for Prostate Cancer- Both blood work (PSA) and a rectal exam help screen for Prostate Cancer.  Screening begins at age 49 with African-American men and at age 21 with Caucasian men.  Screening may begin sooner depending on your family history.  Take these medicines  Aspirin- One aspirin daily can help prevent Heart disease and Stroke.  Flu shot- Every fall.  Tetanus- Every 10 years.  Zostavax- Once after the age of 44 to prevent Shingles.  Pneumonia shot- Once after the age of 71; if you are younger than 40, ask your healthcare provider if you need a Pneumonia shot.  Take these steps  Don't smoke- If you  do smoke, talk to your doctor about quitting.  For tips on how to quit, go to www.smokefree.gov or call 1-800-QUIT-NOW.  Be physically active- Exercise 5 days a week for at least 30 minutes.  If you are not already physically active start slow and gradually work up to 30 minutes of moderate physical activity.  Examples of moderate activity include walking briskly, mowing the yard, dancing, swimming, bicycling, etc.  Eat a healthy diet- Eat a variety of healthy food such as fruits, vegetables, low fat milk, low fat cheese, yogurt, lean meant, poultry, fish, beans, tofu, etc. For more information go to www.thenutritionsource.org  Drink alcohol in moderation- Limit alcohol intake to less than two drinks a day. Never drink and drive.  Dentist- Brush and floss twice daily; visit your dentist twice a year.  Depression- Your emotional health is as important as your physical health. If you're feeling down, or losing interest in things you would normally enjoy please talk to your healthcare provider.  Eye exam- Visit your eye doctor every year.  Safe sex- If you may be exposed to a sexually transmitted infection, use a condom.  Seat belts- Seat belts can save your life; always wear one.  Smoke/Carbon Monoxide detectors- These detectors need to be installed on the appropriate level of your home.  Replace batteries at least once a year.  Skin cancer- When out in the sun, cover up and use sunscreen 15 SPF or higher.  Violence- If anyone is threatening you, please  tell your healthcare provider.  Living Will/ Health care power of attorney- Speak with your healthcare provider and family.  Body Ringworm Ringworm (tinea corporis) is a fungal infection of the skin on the body. This infection is not caused by worms, but is actually caused by a fungus. Fungus normally lives on the top of your skin and can be useful. However, in the case of ringworms, the fungus grows out of control and causes a skin  infection. It can involve any area of skin on the body and can spread easily from one person to another (contagious). Ringworm is a common problem for children, but it can affect adults as well. Ringworm is also often found in athletes, especially wrestlers who share equipment and mats.  CAUSES  Ringworm of the body is caused by a fungus called dermatophyte. It can spread by:  Touchingother people who are infected.  Touchinginfected pets.  Touching or sharingobjects that have been in contact with the infected person or pet (hats, combs, towels, clothing, sports equipment). SYMPTOMS   Itchy, raised red spots and bumps on the skin.  Ring-shaped rash.  Redness near the border of the rash with a clear center.  Dry and scaly skin on or around the rash. Not every person develops a ring-shaped rash. Some develop only the red, scaly patches. DIAGNOSIS  Most often, ringworm can be diagnosed by performing a skin exam. Your caregiver may choose to take a skin scraping from the affected area. The sample will be examined under the microscope to see if the fungus is present.  TREATMENT  Body ringworm may be treated with a topical antifungal cream or ointment. Sometimes, an antifungal shampoo that can be used on your body is prescribed. You may be prescribed antifungal medicines to take by mouth if your ringworm is severe, keeps coming back, or lasts a long time.  HOME CARE INSTRUCTIONS   Only take over-the-counter or prescription medicines as directed by your caregiver.  Wash the infected area and dry it completely before applying yourcream or ointment.  When using antifungal shampoo to treat the ringworm, leave the shampoo on the body for 3 5 minutes before rinsing.   Wear loose clothing to stop clothes from rubbing and irritating the rash.  Wash or change your bed sheets every night while you have the rash.  Have your pet treated by your veterinarian if it has the same infection. To  prevent ringworm:   Practice good hygiene.  Wear sandals or shoes in public places and showers.  Do not share personal items with others.  Avoid touching red patches of skin on other people.  Avoid touching pets that have bald spots or wash your hands after doing so. SEEK MEDICAL CARE IF:   Your rash continues to spread after 7 days of treatment.  Your rash is not gone in 4 weeks.  The area around your rash becomes red, warm, tender, and swollen. Document Released: 08/22/2000 Document Revised: 05/19/2012 Document Reviewed: 03/08/2012 [facility] Patient Information 2014 [place].

## 2014-02-10 NOTE — Progress Notes (Signed)
Subjective:    Patient ID: Jackson Perry, male    DOB: 1946/05/06, 68 y.o.   MRN: [id number] Chief Complaint  Patient presents with  . Annual Exam    CPE    HPI  Is doing really well today. No GI/GU complaints. Sleeping well - occ takes rare prn xanax - would like a refill today.  His son 71 yo was married in [place] last wk - they rented a house and had a huge party - lots of fun.  Has a itchy rash - was seen here 3 mos ago when it first developed. Thought it was shingles but it never become painful or burning and never went away. Has now spread from his left back to his right back and now down his right leg. Has 2 cats - indoor only.  Has stopped his cholesterol medication >4-6 mos ago. His wife is really against the statins and they are really very good about their diet and he exercises so really wants to see what his cholesterol is now w/o the medication. Had severe myalgias w/ crestor. Tolerated pravastatin ok.  Past Medical History  Diagnosis Date  . Depression   . Anemia    Past Surgical History  Procedure Laterality Date  . Cholecystectomy     Current Outpatient Prescriptions on File Prior to Visit  Medication Sig Dispense Refill  . aspirin 81 MG tablet Take 81 mg by mouth daily.       No current facility-administered medications on file prior to visit.   Allergies  Allergen Reactions  . Bupropion Hcl    Family History  Problem Relation Age of Onset  . Heart disease Father   . Diabetes Father   . Cancer Father    History   Social History  . Marital Status: Married    Spouse Name: N/A    Number of Children: N/A  . Years of Education: N/A   Social History Main Topics  . Smoking status: Former Smoker    Types: Cigarettes    Quit date: 11/05/2003  . Smokeless tobacco: None  . Alcohol Use: None  . Drug Use: None  . Sexual Activity: None   Other Topics Concern  . None   Social History Narrative  . None    Review of Systems  All other systems  reviewed and are negative.     BP 130/82  Pulse 80  Temp(Src) 98.2 F (36.8 C) (Oral)  Resp 20  Ht 5\' 8"  (1.727 m)  Wt 152 lb 12.8 oz (69.31 kg)  BMI 23.24 kg/m2  SpO2 97% Objective:   Physical Exam  Constitutional: He is oriented to person, place, and time. He appears well-developed and well-nourished. No distress.  HENT:  Head: Normocephalic and atraumatic.  Right Ear: Tympanic membrane, external ear and ear canal normal.  Left Ear: Tympanic membrane, external ear and ear canal normal.  Nose: Nose normal.  Mouth/Throat: Uvula is midline, oropharynx is clear and moist and mucous membranes are normal. No oropharyngeal exudate.  Eyes: Conjunctivae are normal. Right eye exhibits no discharge. Left eye exhibits no discharge. No scleral icterus.  Neck: Normal range of motion. Neck supple. No thyromegaly present.  Cardiovascular: Normal rate, regular rhythm, normal heart sounds and intact distal pulses.   Pulmonary/Chest: Effort normal and breath sounds normal. No respiratory distress.  Abdominal: Soft. Bowel sounds are normal. He exhibits no distension and no mass. There is no tenderness. There is no rebound and no guarding.  Musculoskeletal: He exhibits  no edema.  Lymphadenopathy:    He has no cervical adenopathy.  Neurological: He is alert and oriented to person, place, and time. He has normal reflexes. No cranial nerve deficit. He exhibits normal muscle tone.  Skin: Skin is warm and dry. No rash noted. He is not diaphoretic. No erythema.  Psychiatric: He has a normal mood and affect. His behavior is normal.          Assessment & Plan:   B12 DEFICIENCY - Plan: CBC with Differential, Vitamin B12  HYPERLIPIDEMIA - Plan: NMR, lipoprofile, Lipid panel  HYPERTENSION - Plan: COMPLETE METABOLIC PANEL WITH GFR, CBC with Differential  Other B-complex deficiencies - Plan: Vitamin B12  Special screening for malignant neoplasm of prostate - Plan: PSA, Medicare  Rash and  nonspecific skin eruption - Plan: POCT Skin KOH, CBC with Differential  Tinea corporis  Meds ordered this encounter  Medications  . DISCONTD: ALPRAZolam (XANAX PO)    Sig: Take 10 mg by mouth as needed.  . ALPRAZolam (XANAX) 1 MG tablet    Sig: Take 1 tablet (1 mg total) by mouth 2 (two) times daily as needed for anxiety.    Dispense:  30 tablet    Refill:  0  . cyanocobalamin (,VITAMIN B-12,) 1000 MCG/ML injection    Sig: Inject 1 ml every 14 days. Please dispense enough syringes and needles.    Dispense:  10 mL    Refill:  2  . enalapril-hydrochlorothiazide (VASERETIC) 10-25 MG per tablet    Sig: Take 1 tablet by mouth daily.    Dispense:  90 tablet    Refill:  3  . ketoconazole (NIZORAL) 2 % cream    Sig: Apply 1 application topically daily.    Dispense:  15 g    Refill:  0  . terbinafine (LAMISIL) 250 MG tablet    Sig: Take 1 tablet (250 mg total) by mouth daily.    Dispense:  7 tablet    Refill:  0     Delman Cheadle, MD MPH

## 2014-02-11 LAB — VITAMIN B12: VITAMIN B 12: 452 pg/mL (ref 211–911)

## 2014-02-11 LAB — PSA, MEDICARE: PSA: 1.94 ng/mL (ref <=4.00)

## 2014-02-13 LAB — NMR LIPOPROFILE WITH LIPIDS
Cholesterol, Total: 180 mg/dL (ref <200)
HDL Particle Number: 26.6 umol/L — ABNORMAL LOW (ref >=30.5)
HDL SIZE: 8.7 nm — AB (ref >=9.2)
HDL-C: 35 mg/dL — AB (ref >=40)
LDL (calc): 133 mg/dL — ABNORMAL HIGH (ref <100)
LDL Particle Number: 1873 nmol/L — ABNORMAL HIGH (ref <1000)
LDL Size: 20.4 nm — ABNORMAL LOW (ref >20.5)
LP-IR SCORE: 54 — AB (ref <=45)
Large HDL-P: 2.3 umol/L — ABNORMAL LOW (ref >=4.8)
Large VLDL-P: 1.4 nmol/L (ref <=2.7)
Small LDL Particle Number: 1042 nmol/L — ABNORMAL HIGH (ref <=527)
Triglycerides: 58 mg/dL (ref <150)
VLDL Size: 43.5 nm (ref <=46.6)

## 2014-09-27 ENCOUNTER — Encounter: Payer: Self-pay | Admitting: Gastroenterology

## 2015-02-20 ENCOUNTER — Encounter: Payer: Self-pay | Admitting: *Deleted

## 2015-04-11 ENCOUNTER — Other Ambulatory Visit: Payer: Self-pay | Admitting: Family Medicine

## 2015-06-28 ENCOUNTER — Other Ambulatory Visit: Payer: Self-pay | Admitting: Family Medicine

## 2015-06-30 ENCOUNTER — Ambulatory Visit (INDEPENDENT_AMBULATORY_CARE_PROVIDER_SITE_OTHER): Payer: Medicare Other | Admitting: Emergency Medicine

## 2015-06-30 VITALS — BP 136/72 | HR 83 | Temp 97.6°F | Resp 18 | Ht 68.0 in | Wt 156.0 lb

## 2015-06-30 DIAGNOSIS — E782 Mixed hyperlipidemia: Secondary | ICD-10-CM | POA: Diagnosis not present

## 2015-06-30 DIAGNOSIS — I10 Essential (primary) hypertension: Secondary | ICD-10-CM

## 2015-06-30 DIAGNOSIS — R040 Epistaxis: Secondary | ICD-10-CM

## 2015-06-30 LAB — POCT CBC
Granulocyte percent: 76.3 %G (ref 37–80)
HEMATOCRIT: 33.7 % — AB (ref 43.5–53.7)
Hemoglobin: 11 g/dL — AB (ref 14.1–18.1)
LYMPH, POC: 2.1 (ref 0.6–3.4)
MCH, POC: 27.1 pg (ref 27–31.2)
MCHC: 32.5 g/dL (ref 31.8–35.4)
MCV: 83.2 fL (ref 80–97)
MID (cbc): 0.5 (ref 0–0.9)
MPV: 7.2 fL (ref 0–99.8)
POC Granulocyte: 8.2 — AB (ref 2–6.9)
POC LYMPH %: 19.2 % (ref 10–50)
POC MID %: 4.5 % (ref 0–12)
Platelet Count, POC: 315 10*3/uL (ref 142–424)
RBC: 4.05 M/uL — AB (ref 4.69–6.13)
RDW, POC: 17.5 %
WBC: 10.8 10*3/uL — AB (ref 4.6–10.2)

## 2015-06-30 NOTE — Patient Instructions (Signed)

## 2015-06-30 NOTE — Progress Notes (Signed)
Subjective:  Patient ID: Jackson Perry, male    DOB: 1945/09/16  Age: 69 y.o. MRN: [id number]  CC: Epistaxis   HPI Jackson Perry presents  he's had intermittent right nostril nosebleeds over the last week. She has no history of injury or antecedent illness. He's not been blowing or picking his nose. He does any history of anemia. He has no fever chills he bled rather vigorously this morning repeatedly during the day and is coming now is not actively bleeding.  History Jackson Perry has a past medical history of Depression and Anemia.   He has past surgical history that includes Cholecystectomy.   His  family history includes Cancer in his father; Diabetes in his father; Heart disease in his father.  He   reports that he quit smoking about 11 years ago. His smoking use included Cigarettes. He does not have any smokeless tobacco history on file. His alcohol and drug histories are not on file.  Outpatient Prescriptions Prior to Visit  Medication Sig Dispense Refill  . ALPRAZolam (XANAX) 1 MG tablet Take 1 tablet (1 mg total) by mouth 2 (two) times daily as needed for anxiety. 30 tablet 0  . cyanocobalamin (,VITAMIN B-12,) 1000 MCG/ML injection Inject 1 mL every 14 days. PATIENT NEEDS OFFICE VISIT/LABS FOR ADDITIONAL REFILLS 2 mL 0  . enalapril-hydrochlorothiazide (VASERETIC) 10-25 MG tablet TAKE 1 TABLET BY MOUTH ONCE A DAY 90 tablet 0  . aspirin 81 MG tablet Take 81 mg by mouth daily.    Marland Kitchen ketoconazole (NIZORAL) 2 % cream Apply 1 application topically daily. (Patient not taking: Reported on 06/30/2015) 15 g 0  . terbinafine (LAMISIL) 250 MG tablet Take 1 tablet (250 mg total) by mouth daily. (Patient not taking: Reported on 06/30/2015) 7 tablet 0   No facility-administered medications prior to visit.    Social History   Social History  . Marital Status: Married    Spouse Name: N/A  . Number of Children: N/A  . Years of Education: N/A   Social History Main Topics  . Smoking  status: Former Smoker    Types: Cigarettes    Quit date: 11/05/2003  . Smokeless tobacco: None  . Alcohol Use: None  . Drug Use: None  . Sexual Activity: Not Asked   Other Topics Concern  . None   Social History Narrative     Review of Systems  Constitutional: Negative for fever, chills and appetite change.  HENT: Positive for nosebleeds. Negative for congestion, ear pain, postnasal drip, sinus pressure and sore throat.   Eyes: Negative for pain and redness.  Respiratory: Negative for cough, shortness of breath and wheezing.   Cardiovascular: Negative for leg swelling.  Gastrointestinal: Negative for nausea, vomiting, abdominal pain, diarrhea, constipation and blood in stool.  Endocrine: Negative for polyuria.  Genitourinary: Negative for dysuria, urgency, frequency and flank pain.  Musculoskeletal: Negative for gait problem.  Skin: Negative for rash.  Neurological: Negative for weakness and headaches.  Psychiatric/Behavioral: Negative for confusion and decreased concentration. The patient is not nervous/anxious.     Objective:  BP 136/72 mmHg  Pulse 83  Temp(Src) 97.6 F (36.4 C) (Oral)  Resp 18  Ht 5\' 8"  (1.727 m)  Wt 156 lb (70.761 kg)  BMI 23.73 kg/m2  SpO2 98%  Physical Exam  Constitutional: He is oriented to person, place, and time. He appears well-developed and well-nourished.  HENT:  Head: Normocephalic and atraumatic.  Nose: Epistaxis (Right nasal septal hemorrhage) is observed.  Eyes: Conjunctivae are  normal. Pupils are equal, round, and reactive to light.  Pulmonary/Chest: Effort normal.  Musculoskeletal: He exhibits no edema.  Neurological: He is alert and oriented to person, place, and time.  Skin: Skin is dry.  Psychiatric: He has a normal mood and affect. His behavior is normal. Thought content normal.      Assessment & Plan:   Jackson Perry was seen today for epistaxis.  Diagnoses and all orders for this visit:  Right-sided nosebleed -     POCT  CBC -     Comprehensive metabolic panel; Future -     Lipid panel; Future  Mixed hyperlipidemia -     POCT CBC -     Comprehensive metabolic panel; Future -     Lipid panel; Future  Essential hypertension, benign -     POCT CBC -     Comprehensive metabolic panel; Future -     Lipid panel; Future   I am having Mr. Schwandt maintain his aspirin, ALPRAZolam, ketoconazole, terbinafine, cyanocobalamin, and enalapril-hydrochlorothiazide.  No orders of the defined types were placed in this encounter.   Patient was anesthetized with 1 and 1/2 mL of tetracaine and 1/2 mL of epinephrine delivered by atomizer. The nasal septum at 2 spots that were suspicious for bleeding and they were cauterized with silver nitrate  He was advised to withhold his antihypertensive as his blood pressure satisfactorily controlled now off medicine for 1 month he'll follow-up with Dr. Brigitte Pulse at his next scheduled appointment he was also referred to ENT  Appropriate red flag conditions were discussed with the patient as well as actions that should be taken.  Patient expressed his understanding.  Follow-up: Return if symptoms worsen or fail to improve.  Roselee Culver, MD

## 2015-07-10 ENCOUNTER — Other Ambulatory Visit: Payer: Self-pay | Admitting: Family Medicine

## 2015-07-10 ENCOUNTER — Ambulatory Visit (INDEPENDENT_AMBULATORY_CARE_PROVIDER_SITE_OTHER): Payer: Medicare Other | Admitting: Family Medicine

## 2015-07-10 VITALS — BP 138/86 | HR 72 | Temp 97.9°F | Resp 18 | Ht 68.5 in | Wt 155.6 lb

## 2015-07-10 DIAGNOSIS — I10 Essential (primary) hypertension: Secondary | ICD-10-CM

## 2015-07-10 DIAGNOSIS — Z23 Encounter for immunization: Secondary | ICD-10-CM | POA: Diagnosis not present

## 2015-07-10 DIAGNOSIS — E785 Hyperlipidemia, unspecified: Secondary | ICD-10-CM | POA: Diagnosis not present

## 2015-07-10 DIAGNOSIS — D62 Acute posthemorrhagic anemia: Secondary | ICD-10-CM | POA: Diagnosis not present

## 2015-07-10 DIAGNOSIS — E538 Deficiency of other specified B group vitamins: Secondary | ICD-10-CM

## 2015-07-10 LAB — VITAMIN B12: Vitamin B-12: 437 pg/mL (ref 211–911)

## 2015-07-10 LAB — FERRITIN: FERRITIN: 9 ng/mL — AB (ref 22–322)

## 2015-07-10 MED ORDER — ALPRAZOLAM 0.5 MG PO TABS: 1.0000 mg | ORAL_TABLET | Freq: Every evening | ORAL | Status: DC | PRN

## 2015-07-10 MED ORDER — CYANOCOBALAMIN 1000 MCG/ML IJ SOLN: INTRAMUSCULAR | Status: DC

## 2015-07-10 NOTE — Progress Notes (Addendum)
Subjective:  This chart was scribed for [name], by [name], at [facility].  This patient was seen in room 14 and the patient's care was started at 8:52 AM.    Patient ID: Jackson Perry, male    DOB: 03-04-1946, 69 y.o.   MRN: [id number] Chief Complaint  Patient presents with  . Medication Refill    ALL meds  . Immunizations    flu vaccine    HPI  HPI Comments: Jackson Perry is a 69 y.o. male who presents to the [facility] for medication refill  and a flu vaccination.  Patient has not had anything to eat today.  He states that he has increased joint pain recently.  He is not currently taking his blood pressure medication and denies any urinary/bowel symptoms, leg swelling, chest pain.  He has been off of his B12 for a little over three weeks. He has not been checking his blood pressure outside of his office visits.  Patient cooks his own meals and has been watching what he eats (states that he and his wife eat very balanced). He eats leafy greens and has increased his iron intake.    Past nose bleed: Patient states that his nose stopped bleeding ever since his last visit.  He states that his wife bought a 2 inch pillow top recently which caused his head to hurt, nose to bleed and body started aching.  He started sleeping on the couch and realized that the mattress was causing his symptoms. He no longer has any complaints.  He did not see the ENT since the symptoms are no longer there.    ------ Patient was seen 18 months ago for CPE He was on cholesterol medicine prior but had severe myalgias on Crestor but no symptoms on pravastatin.  His wife is really against statin therapy.  We had suggested perhaps doing a detailed lipid profile breakdown to get more information on how much a Statin would benefit him. He had the test done which confirmed that his HDL was too low.  He stopped his cholesterol medication 2 years ago and 4-6 months  after starting his cholesterol medicine, he had the detailed lipid profile breakdown, which confirmed his HDL was too low but otherwise seemed insignificant.  His blood pressure has been well controlled on his current regimen.  He has been giving himself B12 injections which are self administered every 2 weeks, and he takes Xanax as needed for sleep, rarely.  He came in and saw one of my partners 2 weeks ago for bleeding out of the nostril. Future lab orders were placed.  At that point he had been off of his blood pressure medication for 1 month and he was advised okay to not restart.  He was referred to ENT. He was mildly anemic with hemoglobin drop from 12.7 to 11.  His weight has been stable over the last several years and last stool blood occult  test was negative. He has seen Dr. Sharlett Iles with [facility] prior.  Last PSA, 18 motnhs prior but has been stable at 1.5-2 for the past several years so okay to refer to CP.   Past Medical History  Diagnosis Date  . Depression   . Anemia     Current Outpatient Prescriptions on File Prior to Visit  Medication Sig Dispense Refill  . ALPRAZolam (XANAX) 1 MG tablet Take 1 tablet (1 mg total) by mouth 2 (  two) times daily as needed for anxiety. 30 tablet 0  . aspirin 81 MG tablet Take 81 mg by mouth daily.    . cyanocobalamin (,VITAMIN B-12,) 1000 MCG/ML injection Inject 1 mL every 14 days. PATIENT NEEDS OFFICE VISIT/LABS FOR ADDITIONAL REFILLS 2 mL 0  . enalapril-hydrochlorothiazide (VASERETIC) 10-25 MG tablet TAKE 1 TABLET BY MOUTH ONCE A DAY (Patient not taking: Reported on 07/10/2015) 90 tablet 0  . ketoconazole (NIZORAL) 2 % cream Apply 1 application topically daily. (Patient not taking: Reported on 06/30/2015) 15 g 0  . terbinafine (LAMISIL) 250 MG tablet Take 1 tablet (250 mg total) by mouth daily. (Patient not taking: Reported on 06/30/2015) 7 tablet 0   No current facility-administered medications on file prior to visit.    Allergies  Allergen  Reactions  . Bupropion Hcl        Review of Systems  Constitutional: Negative for fever and chills.  Respiratory: Negative for cough, choking and shortness of breath.   Cardiovascular: Negative for chest pain and leg swelling.  Gastrointestinal: Negative for vomiting, abdominal pain, diarrhea and constipation.  Genitourinary: Negative for dysuria, urgency, frequency and difficulty urinating.  Musculoskeletal: Positive for arthralgias. Negative for joint swelling.  Neurological: Negative for syncope and speech difficulty.       Objective:   Physical Exam  Constitutional: He is oriented to person, place, and time. He appears well-developed and well-nourished. No distress.  HENT:  Head: Normocephalic and atraumatic.  Eyes: Pupils are equal, round, and reactive to light.  Neck: Normal range of motion. No thyromegaly present.  Cardiovascular: Normal rate, regular rhythm and normal heart sounds.  Exam reveals no friction rub.   No murmur heard. Pulmonary/Chest: Effort normal and breath sounds normal. No respiratory distress. He has no wheezes. He has no rales. He exhibits no tenderness.  Musculoskeletal: Normal range of motion.  Neurological: He is alert and oriented to person, place, and time.  Skin: Skin is warm and dry.  Psychiatric: He has a normal mood and affect. His behavior is normal.  Nursing note and vitals reviewed.  Filed Vitals:   07/10/15 0811  BP: 138/86  Pulse: 72  Temp: 97.9 F (36.6 C)  TempSrc: Oral  Resp: 18  Height: 5' 8.5" (1.74 m)  Weight: 155 lb 9.6 oz (70.58 kg)  SpO2: 97%          Assessment & Plan:   1. Acute blood loss anemia - sig worsened since recurrent epistaxis which fortunately resolved last wk after trx from Dr. Ouida Sills and was triggered by mattress cover allergy - has cancelled ENT referral for today since no further prob. Start iron supp qd x 2-3 mos and reviewed increasing iron in det  2. Essential hypertension - no sig change off  of enalapril-hcz 10-25 for years prior. BP at goal so ok to cont w/o med, does not check BP at home  3. Vitamin B12 deficiency - self administers B12 inj every 2 wks - ok to cont x 1 yr  4. Hyperlipidemia - very healthy diet and exercise. crestor caused myalgias, did ok on pravastatin prior but wife adamantly wants pt to stay off statin due to side effects/risks.  5. Need for prophylactic vaccination and inoculation against influenza   Has future lab orders today  6. Occ insomnia - doing better on lwer dose his wife has so rec decrease from 1g to 0.5mg  - has only used <30-60 tabs in past 1 1 /2 yrs.  Orders Placed This Encounter  Procedures  . Flu Vaccine QUAD 36+ mos IM  . Vitamin B12  . Ferritin    Meds ordered this encounter  Medications  . ALPRAZolam (XANAX) 0.5 MG tablet    Sig: Take 2 tablets (1 mg total) by mouth at bedtime as needed for sleep.    Dispense:  30 tablet    Refill:  1  . cyanocobalamin (,VITAMIN B-12,) 1000 MCG/ML injection    Sig: Inject 1 mL every 14 days    Dispense:  10 mL    Refill:  3    I personally performed the services described in this documentation, which was scribed in my presence. The recorded information has been reviewed and considered, and addended by me as needed.  Delman Cheadle, MD MPH

## 2015-07-11 ENCOUNTER — Telehealth: Payer: Self-pay

## 2015-07-11 LAB — LIPID PANEL
CHOL/HDL RATIO: 5.1 ratio — AB (ref <=5.0)
Cholesterol: 194 mg/dL (ref 125–200)
HDL: 38 mg/dL — AB (ref >=40)
LDL CALC: 143 mg/dL — AB (ref <130)
Triglycerides: 66 mg/dL (ref <150)
VLDL: 13 mg/dL (ref <30)

## 2015-07-11 LAB — COMPREHENSIVE METABOLIC PANEL
ALT: 13 U/L (ref 9–46)
AST: 15 U/L (ref 10–35)
Albumin: 4 g/dL (ref 3.6–5.1)
Alkaline Phosphatase: 63 U/L (ref 40–115)
BILIRUBIN TOTAL: 0.5 mg/dL (ref 0.2–1.2)
BUN: 15 mg/dL (ref 7–25)
CO2: 24 mmol/L (ref 20–31)
Calcium: 8.7 mg/dL (ref 8.6–10.3)
Chloride: 107 mmol/L (ref 98–110)
Creat: 0.85 mg/dL (ref 0.70–1.18)
GLUCOSE: 102 mg/dL — AB (ref 65–99)
Potassium: 4.8 mmol/L (ref 3.5–5.3)
SODIUM: 141 mmol/L (ref 135–146)
Total Protein: 6.6 g/dL (ref 6.1–8.1)

## 2015-07-11 NOTE — Telephone Encounter (Signed)
Tests added 

## 2015-07-11 NOTE — Telephone Encounter (Signed)
-----   Message from Shawnee Knapp, MD sent at 07/10/2015 11:38 PM EDT ----- Pt wanted the future labs - cmp and lipids ordered on 10/22 released and ran along with his labs I ordered yest but look like they were not - please add on.

## 2015-07-23 ENCOUNTER — Telehealth: Payer: Self-pay | Admitting: Family Medicine

## 2015-07-23 NOTE — Telephone Encounter (Signed)
Patient is waiting on his lab results. Please advise.  [phone]

## 2015-07-23 NOTE — Telephone Encounter (Signed)
Spoke to pt and informed of results

## 2015-07-23 NOTE — Telephone Encounter (Signed)
Notes Recorded by Shawnee Knapp, MD on 07/10/2015 at 5:29 PM Jackson Perry -  Your vitamin B12 is a little lower than it has been in the past several years, consistent with the increased time since your prior injection. Your iron level is much to low from your recent blood loss. Start an iron supplement once a day for the next 2 months or so to replenish what you lost - I recommend ferrous sulfate 324mg  (65mg  of elemental iron). Take this on an empty stomach 30 minutes before eating or 2 hours after a meal. Take it with a vitamin C supplement or a small glass of orange juice. Below are also ways to increase your diet in iron. Dr. Brigitte Pulse

## 2015-07-23 NOTE — Telephone Encounter (Signed)
Looks like I had sent this to [employer]. Please call pt and see if he would like a hard copy mailed. Thanks.

## 2015-08-09 ENCOUNTER — Encounter: Payer: Self-pay | Admitting: Family Medicine

## 2015-08-10 ENCOUNTER — Encounter: Payer: Self-pay | Admitting: Family Medicine

## 2015-11-28 ENCOUNTER — Encounter: Payer: Self-pay | Admitting: Gastroenterology

## 2016-05-13 DIAGNOSIS — H2513 Age-related nuclear cataract, bilateral: Secondary | ICD-10-CM | POA: Diagnosis not present

## 2016-05-21 DIAGNOSIS — H2511 Age-related nuclear cataract, right eye: Secondary | ICD-10-CM | POA: Diagnosis not present

## 2016-05-21 DIAGNOSIS — H25041 Posterior subcapsular polar age-related cataract, right eye: Secondary | ICD-10-CM | POA: Diagnosis not present

## 2016-05-21 DIAGNOSIS — H25811 Combined forms of age-related cataract, right eye: Secondary | ICD-10-CM | POA: Diagnosis not present

## 2016-05-22 ENCOUNTER — Ambulatory Visit (INDEPENDENT_AMBULATORY_CARE_PROVIDER_SITE_OTHER): Payer: Medicare Other | Admitting: Family Medicine

## 2016-05-22 VITALS — BP 138/96 | HR 77 | Temp 97.8°F | Resp 17 | Ht 68.5 in | Wt 155.0 lb

## 2016-05-22 DIAGNOSIS — D509 Iron deficiency anemia, unspecified: Secondary | ICD-10-CM | POA: Diagnosis not present

## 2016-05-22 DIAGNOSIS — I10 Essential (primary) hypertension: Secondary | ICD-10-CM

## 2016-05-22 DIAGNOSIS — Z23 Encounter for immunization: Secondary | ICD-10-CM | POA: Diagnosis not present

## 2016-05-22 LAB — CBC WITH DIFFERENTIAL/PLATELET
BASOS PCT: 0 %
Basophils Absolute: 0 cells/uL (ref 0–200)
EOS ABS: 96 {cells}/uL (ref 15–500)
Eosinophils Relative: 1 %
HCT: 35.7 % — ABNORMAL LOW (ref 38.5–50.0)
Hemoglobin: 11 g/dL — ABNORMAL LOW (ref 13.2–17.1)
Lymphocytes Relative: 20 %
Lymphs Abs: 1920 cells/uL (ref 850–3900)
MCH: 24.2 pg — ABNORMAL LOW (ref 27.0–33.0)
MCHC: 30.8 g/dL — ABNORMAL LOW (ref 32.0–36.0)
MCV: 78.6 fL — AB (ref 80.0–100.0)
MONOS PCT: 5 %
MPV: 9.8 fL (ref 7.5–12.5)
Monocytes Absolute: 480 cells/uL (ref 200–950)
NEUTROS ABS: 7104 {cells}/uL (ref 1500–7800)
Neutrophils Relative %: 74 %
PLATELETS: 327 10*3/uL (ref 140–400)
RBC: 4.54 MIL/uL (ref 4.20–5.80)
RDW: 18.4 % — ABNORMAL HIGH (ref 11.0–15.0)
WBC: 9.6 10*3/uL (ref 3.8–10.8)

## 2016-05-22 LAB — BASIC METABOLIC PANEL
BUN: 16 mg/dL (ref 7–25)
CHLORIDE: 108 mmol/L (ref 98–110)
CO2: 25 mmol/L (ref 20–31)
CREATININE: 0.85 mg/dL (ref 0.70–1.18)
Calcium: 8.8 mg/dL (ref 8.6–10.3)
Glucose, Bld: 87 mg/dL (ref 65–99)
POTASSIUM: 4.3 mmol/L (ref 3.5–5.3)
Sodium: 140 mmol/L (ref 135–146)

## 2016-05-22 LAB — FERRITIN: FERRITIN: 11 ng/mL — AB (ref 20–380)

## 2016-05-22 MED ORDER — HYDROCHLOROTHIAZIDE 12.5 MG PO TABS: 12.5000 mg | ORAL_TABLET | Freq: Every day | ORAL | 1 refills | Status: DC

## 2016-05-22 NOTE — Patient Instructions (Addendum)
If your blood pressure remains over 140/90 outside of office, start the prescription for hydrochlorothiazide 1 pill once per day.  I will check your iron tests and blood counts again, but below are some iron rich foods, or you may need to take an iron supplement depending on the blood test results. Keep follow-up with Dr. Brigitte Pulse for your scheduled appointment  Iron-Rich Diet Iron is a mineral that helps your body to produce hemoglobin. Hemoglobin is a protein in your red blood cells that carries oxygen to your body's tissues. Eating too little iron may cause you to feel weak and tired, and it can increase your risk for infection. Eating enough iron is necessary for your body's metabolism, muscle function, and nervous system. Iron is naturally found in many foods. It can also be added to foods or fortified in foods. There are two types of dietary iron:  Heme iron. Heme iron is absorbed by the body more easily than nonheme iron. Heme iron is found in meat, poultry, and fish.  Nonheme iron. Nonheme iron is found in dietary supplements, iron-fortified grains, beans, and vegetables. You may need to follow an iron-rich diet if:  You have been diagnosed with iron deficiency or iron-deficiency anemia.  You have a condition that prevents you from absorbing dietary iron, such as:  Infection in your intestines.  Celiac disease. This involves long-lasting (chronic) inflammation of your intestines.  You do not eat enough iron.  You eat a diet that is high in foods that impair iron absorption.  You have lost a lot of blood.  You have heavy bleeding during your menstrual cycle.  You are pregnant. WHAT IS MY PLAN? Your health care provider may help you to determine how much iron you need per day based on your condition. Generally, when a person consumes sufficient amounts of iron in the diet, the following iron needs are met:  Men.  30-27 years old: 11 mg per day.  53-40 years old: 8 mg per  day.  Women.   53-70 years old: 15 mg per day.  19-60 years old: 18 mg per day.  Over 8 years old: 8 mg per day.  Pregnant women: 27 mg per day.  Breastfeeding women: 9 mg per day. WHAT DO I NEED TO KNOW ABOUT AN IRON-RICH DIET?  Eat fresh fruits and vegetables that are high in vitamin C along with foods that are high in iron. This will help increase the amount of iron that your body absorbs from food, especially with foods containing nonheme iron. Foods that are high in vitamin C include oranges, peppers, tomatoes, and mango.  Take iron supplements only as directed by your health care provider. Overdose of iron can be life-threatening. If you were prescribed iron supplements, take them with orange juice or a vitamin C supplement.  Cook foods in pots and pans that are made from iron.   Eat nonheme iron-containing foods alongside foods that are high in heme iron. This helps to improve your iron absorption.   Certain foods and drinks contain compounds that impair iron absorption. Avoid eating these foods in the same meal as iron-rich foods or with iron supplements. These include:  Coffee, black tea, and red wine.  Milk, dairy products, and foods that are high in calcium.  Beans, soybeans, and peas.  Whole grains.  When eating foods that contain both nonheme iron and compounds that impair iron absorption, follow these tips to absorb iron better.   Soak beans overnight before cooking.  Soak whole grains overnight and drain them before using.  Ferment flours before baking, such as using yeast in bread dough. WHAT FOODS CAN I EAT? Grains Iron-fortified breakfast cereal. Iron-fortified whole-wheat bread. Enriched rice. Sprouted grains. Vegetables Spinach. Potatoes with skin. Green peas. Broccoli. Red and green bell peppers. Fermented vegetables. Fruits Prunes. Raisins. Oranges. Strawberries. Mango. Grapefruit. Meats and Other Protein Sources Beef liver. Oysters.  Beef. Shrimp. [place]. Chicken. [place]. Sardines. Chickpeas. Nuts. Tofu. Beverages Tomato juice. Fresh orange juice. Prune juice. Hibiscus tea. Fortified instant breakfast shakes. Condiments Tahini. Fermented soy sauce. Sweets and Desserts Black-strap molasses.  Other Wheat germ. The items listed above may not be a complete list of recommended foods or beverages. Contact your dietitian for more options. WHAT FOODS ARE NOT RECOMMENDED? Grains Whole grains. Bran cereal. Bran flour. Oats. Vegetables Artichokes. Brussels sprouts. Kale. Fruits Blueberries. Raspberries. Strawberries. Figs. Meats and Other Protein Sources Soybeans. Products made from soy protein. Dairy Milk. Cream. Cheese. Yogurt. Cottage cheese. Beverages Coffee. Black tea. Red wine. Sweets and Desserts Cocoa. Chocolate. Ice cream. Other Basil. Oregano. Parsley. The items listed above may not be a complete list of foods and beverages to avoid. Contact your dietitian for more information.   This information is not intended to replace advice given to you by your health care provider. Make sure you discuss any questions you have with your health care provider.   Document Released: 04/08/2005 Document Revised: 09/15/2014 Document Reviewed: 03/22/2014 Elsevier Interactive Patient Education 2016 [employer].    IF you received an x-ray today, you will receive an invoice from [facility]. Please contact [facility] at [phone] with questions or concerns regarding your invoice.   IF you received labwork today, you will receive an invoice from [employer]. Please contact [facility] at [phone] with questions or concerns regarding your invoice.   Our billing staff will not be able to assist you with questions regarding bills from these companies.  You will be contacted with the lab results as soon as they are available. The fastest way to get your results is to  activate your My Chart account. Instructions are located on the last page of this paperwork. If you have not heard from [place] regarding the results in 2 weeks, please contact this office.     Influenza (Flu) Vaccine (Inactivated or Recombinant):  1. Why get vaccinated? Influenza ("flu") is a contagious disease that spreads around the [place] every year, usually between October and May. Flu is caused by influenza viruses, and is spread mainly by coughing, sneezing, and close contact. Anyone can get flu. Flu strikes suddenly and can last several days. Symptoms vary by age, but can include:  fever/chills  sore throat  muscle aches  fatigue  cough  headache  runny or stuffy nose Flu can also lead to pneumonia and blood infections, and cause diarrhea and seizures in children. If you have a medical condition, such as heart or lung disease, flu can make it worse. Flu is more dangerous for some people. Infants and young children, people 35 years of age and older, pregnant women, and people with certain health conditions or a weakened immune system are at greatest risk. Each year thousands of people in the [place] die from flu, and many more are hospitalized. Flu vaccine can:  keep you from getting flu,  make flu less severe if you do get it, and  keep you from spreading flu to your family and other people. 2. Inactivated and recombinant flu  vaccines A dose of flu vaccine is recommended every flu season. Children 6 months through 85 years of age may need two doses during the same flu season. Everyone else needs only one dose each flu season. Some inactivated flu vaccines contain a very small amount of a mercury-based preservative called thimerosal. Studies have not shown thimerosal in vaccines to be harmful, but flu vaccines that do not contain thimerosal are available. There is no live flu virus in flu shots. They cannot cause the flu. There are many flu viruses, and they are  always changing. Each year a new flu vaccine is made to protect against three or four viruses that are likely to cause disease in the upcoming flu season. But even when the vaccine doesn't exactly match these viruses, it may still provide some protection. Flu vaccine cannot prevent:  flu that is caused by a virus not covered by the vaccine, or  illnesses that look like flu but are not. It takes about 2 weeks for protection to develop after vaccination, and protection lasts through the flu season. 3. Some people should not get this vaccine Tell the person who is giving you the vaccine:  If you have any severe, life-threatening allergies. If you ever had a life-threatening allergic reaction after a dose of flu vaccine, or have a severe allergy to any part of this vaccine, you may be advised not to get vaccinated. Most, but not all, types of flu vaccine contain a small amount of egg protein.  If you ever had Guillain-Barre Syndrome (also called GBS). Some people with a history of GBS should not get this vaccine. This should be discussed with your doctor.  If you are not feeling well. It is usually okay to get flu vaccine when you have a mild illness, but you might be asked to come back when you feel better. 4. Risks of a vaccine reaction With any medicine, including vaccines, there is a chance of reactions. These are usually mild and go away on their own, but serious reactions are also possible. Most people who get a flu shot do not have any problems with it. Minor problems following a flu shot include:  soreness, redness, or swelling where the shot was given  hoarseness  sore, red or itchy eyes  cough  fever  aches  headache  itching  fatigue If these problems occur, they usually begin soon after the shot and last 1 or 2 days. More serious problems following a flu shot can include the following:  There may be a small increased risk of Guillain-Barre Syndrome (GBS) after  inactivated flu vaccine. This risk has been estimated at 1 or 2 additional cases per million people vaccinated. This is much lower than the risk of severe complications from flu, which can be prevented by flu vaccine.  Young children who get the flu shot along with pneumococcal vaccine (PCV13) and/or DTaP vaccine at the same time might be slightly more likely to have a seizure caused by fever. Ask your doctor for more information. Tell your doctor if a child who is getting flu vaccine has ever had a seizure. Problems that could happen after any injected vaccine:  People sometimes faint after a medical procedure, including vaccination. Sitting or lying down for about 15 minutes can help prevent fainting, and injuries caused by a fall. Tell your doctor if you feel dizzy, or have vision changes or ringing in the ears.  Some people get severe pain in the shoulder and have difficulty  moving the arm where a shot was given. This happens very rarely.  Any medication can cause a severe allergic reaction. Such reactions from a vaccine are very rare, estimated at about 1 in a million doses, and would happen within a few minutes to a few hours after the vaccination. As with any medicine, there is a very remote chance of a vaccine causing a serious injury or death. The safety of vaccines is always being monitored. For more information, visit: http://www.aguilar.org/ 5. What if there is a serious reaction? What should I look for?  Look for anything that concerns you, such as signs of a severe allergic reaction, very high fever, or unusual behavior. Signs of a severe allergic reaction can include hives, swelling of the face and throat, difficulty breathing, a fast heartbeat, dizziness, and weakness. These would start a few minutes to a few hours after the vaccination. What should I do?  If you think it is a severe allergic reaction or other emergency that can't wait, call 9-1-1 and get the person to the  nearest hospital. Otherwise, call your doctor.  Reactions should be reported to the Vaccine Adverse Event Reporting System (VAERS). Your doctor should file this report, or you can do it yourself through the VAERS web site at www.vaers.SamedayNews.es, or by calling 315 173 2796. VAERS does not give medical advice. 6. The National Vaccine Injury Compensation Program The [employer] Vaccine Injury Compensation Program (VICP) is a federal program that was created to compensate people who may have been injured by certain vaccines. Persons who believe they may have been injured by a vaccine can learn about the program and about filing a claim by calling (609)652-4851 or visiting the [place] website at GoldCloset.com.ee. There is a time limit to file a claim for compensation. 7. How can I learn more?  Ask your healthcare provider. He or she can give you the vaccine package insert or suggest other sources of information.  Call your local or state health department.  Contact the Centers for Disease Control and Prevention (CDC):  Call 346 144 7363 (1-800-CDC-INFO) or  Visit CDC's website at https://gibson.com/ Vaccine Information Statement Inactivated Influenza Vaccine (04/14/2014)   This information is not intended to replace advice given to you by your health care provider. Make sure you discuss any questions you have with your health care provider.   Document Released: 06/19/2006 Document Revised: 09/15/2014 Document Reviewed: 04/17/2014 Elsevier Interactive Patient Education [employer].

## 2016-05-22 NOTE — Progress Notes (Signed)
Subjective:  By signing my name below, I, Jackson Perry, attest that this documentation has been prepared under the direction and in the presence of Jackson Ray, MD. Electronically Signed: Moises Perry, [place]. 05/22/2016 , 12:56 PM .  Patient was seen in Room 1 .   Patient ID: Jackson Perry, male    DOB: [birth year], 70 y.o.   MRN: [id number] Chief Complaint  Patient presents with  . Perry pressure check    Was told to come in after surgery to check his BP   HPI Jackson Perry is a 70 y.o. male Here for follow up of Perry pressure. He was last seen here in Nov 2016 for anemia at that time, and nosebleeds prior in Oct 2016. He hasn't been on BP medication. He also has history of HLD, but intolerance to crestor due to myalgias. He was self-administering B12 shots at that time. He notes having an annual physical coming up in 1 month with Dr. Brigitte Perry.   His last meal was 6:30AM this morning.   HTN He's been on enalapril-hctz in the past; he doesn't recall any side-effects. He's doing been doing well off medication last visit so he remained off medication.   He had cataract surgery yesterday and was informed having high BP. He hasn't been checking his BP at home. His past BP's have been: 138/86 in Nov 2016, and 136/72 in Oct 2016. He's been off medications for nearly a year. His BP is 138/96 today. He denies chest pain, chest tightness, shortness of breath, abdominal pain, Perry in stool, lightheadedness or dizziness.   He has about 2 alcoholic drinks a day; denies any changes with this recently.   Anemia He was advised to take OTC iron supplement as his last ferritin was 9 in Nov 2016.   Patient denies taking any iron supplement.   Patient Active Problem List   Diagnosis Date Noted  . Vitamin B12 deficiency 04/26/2009  . Hyperlipidemia 04/26/2009  . Essential hypertension 04/26/2009   Past Medical History:  Diagnosis Date  . Anemia   . Depression    Past Surgical History:    Procedure Laterality Date  . CHOLECYSTECTOMY     Allergies  Allergen Reactions  . Bupropion Hcl    Prior to Admission medications   Medication Sig Start Date End Date Taking? Authorizing Provider  ALPRAZolam Duanne Moron) 0.5 MG tablet Take 2 tablets (1 mg total) by mouth at bedtime as needed for sleep. 07/10/15  Yes Shawnee Knapp, MD  aspirin 81 MG tablet Take 81 mg by mouth daily.   Yes Historical Provider, MD  cyanocobalamin (,VITAMIN B-12,) 1000 MCG/ML injection Inject 1 mL every 14 days 07/10/15  Yes Shawnee Knapp, MD   Social History   Social History  . Marital status: Married    Spouse name: N/A  . Number of children: N/A  . Years of education: N/A   Occupational History  . Not on file.   Social History Main Topics  . Smoking status: Former Smoker    Types: Cigarettes    Quit date: 11/05/2003  . Smokeless tobacco: Not on file  . Alcohol use Not on file  . Drug use: Unknown  . Sexual activity: Not on file   Other Topics Concern  . Not on file   Social History Narrative  . No narrative on file   Review of Systems  Constitutional: Negative for fatigue and unexpected weight change.  Eyes: Negative for visual disturbance.  Respiratory: Negative for  cough, chest tightness and shortness of breath.   Cardiovascular: Negative for chest pain, palpitations and leg swelling.  Gastrointestinal: Negative for abdominal pain and Perry in stool.  Neurological: Negative for dizziness, light-headedness and headaches.       Objective:   Physical Exam  Constitutional: He is oriented to person, place, and time. He appears well-developed and well-nourished.  HENT:  Head: Normocephalic and atraumatic.  Eyes: EOM are normal. Pupils are equal, round, and reactive to light.  Neck: No JVD present. Carotid bruit is not present.  Cardiovascular: Normal rate, regular rhythm and normal heart sounds.   No murmur heard. Pulmonary/Chest: Effort normal and breath sounds normal. He has no rales.   Musculoskeletal: He exhibits no edema.  Neurological: He is alert and oriented to person, place, and time.  Skin: Skin is warm and dry.  Psychiatric: He has a normal mood and affect.  Vitals reviewed.   Vitals:   05/22/16 1133  BP: (!) 138/96  Perry: 77  Resp: 17  Temp: 97.8 F (36.6 C)  TempSrc: Oral  SpO2: 98%  Weight: 155 lb (70.3 kg)  Height: 5' 8.5" (1.74 m)      Assessment & Plan:   Jackson Perry is a 70 y.o. male Flu vaccine need - Plan: Flu Vaccine QUAD 36+ mos IM given  Essential hypertension - Plan: Basic metabolic panel  - if remaining over 140/90 at home - start hctz 12.19m qd ( at current level, doubt dual agent needed). Check BMP. Keep follow up with PCP as planned.   Anemia, iron deficiency - Plan: CBC with Differential/Platelet, Ferritin  - repeat labs. Iron rich foods discussed on handout.   Meds ordered this encounter  Medications  . hydrochlorothiazide (HYDRODIURIL) 12.5 MG tablet    Sig: Take 1 tablet (12.5 mg total) by mouth daily.    Dispense:  30 tablet    Refill:  1   Patient Instructions    If your Perry pressure remains over 140/90 outside of office, start the prescription for hydrochlorothiazide 1 pill once per day.  I will check your iron tests and Perry counts again, but below are some iron rich foods, or you may need to take an iron supplement depending on the Perry test results. Keep follow-up with Dr. SBrigitte Pulsefor your scheduled appointment  Iron-Rich Diet Iron is a mineral that helps your body to produce hemoglobin. Hemoglobin is a protein in your red Perry cells that carries oxygen to your body's tissues. Eating too little iron may cause you to feel weak and tired, and it can increase your risk for infection. Eating enough iron is necessary for your body's metabolism, muscle function, and nervous system. Iron is naturally found in many foods. It can also be added to foods or fortified in foods. There are two types of dietary  iron:  Heme iron. Heme iron is absorbed by the body more easily than nonheme iron. Heme iron is found in meat, poultry, and fish.  Nonheme iron. Nonheme iron is found in dietary supplements, iron-fortified grains, beans, and vegetables. You may need to follow an iron-rich diet if:  You have been diagnosed with iron deficiency or iron-deficiency anemia.  You have a condition that prevents you from absorbing dietary iron, such as:  Infection in your intestines.  Celiac disease. This involves long-lasting (chronic) inflammation of your intestines.  You do not eat enough iron.  You eat a diet that is high in foods that impair iron absorption.  You have lost  a lot of Perry.  You have heavy bleeding during your menstrual cycle.  You are pregnant. WHAT IS MY PLAN? Your health care provider may help you to determine how much iron you need per day based on your condition. Generally, when a person consumes sufficient amounts of iron in the diet, the following iron needs are met:  Men.  20-31 years old: 11 mg per day.  81-70 years old: 8 mg per day.  Women.   15-39 years old: 15 mg per day.  59-97 years old: 18 mg per day.  Over 52 years old: 8 mg per day.  Pregnant women: 27 mg per day.  Breastfeeding women: 9 mg per day. WHAT DO I NEED TO KNOW ABOUT AN IRON-RICH DIET?  Eat fresh fruits and vegetables that are high in vitamin C along with foods that are high in iron. This will help increase the amount of iron that your body absorbs from food, especially with foods containing nonheme iron. Foods that are high in vitamin C include oranges, peppers, tomatoes, and mango.  Take iron supplements only as directed by your health care provider. Overdose of iron can be life-threatening. If you were prescribed iron supplements, take them with orange juice or a vitamin C supplement.  Cook foods in pots and pans that are made from iron.   Eat nonheme iron-containing foods alongside  foods that are high in heme iron. This helps to improve your iron absorption.   Certain foods and drinks contain compounds that impair iron absorption. Avoid eating these foods in the same meal as iron-rich foods or with iron supplements. These include:  Coffee, black tea, and red wine.  Milk, dairy products, and foods that are high in calcium.  Beans, soybeans, and peas.  Whole grains.  When eating foods that contain both nonheme iron and compounds that impair iron absorption, follow these tips to absorb iron better.   Soak beans overnight before cooking.  Soak whole grains overnight and drain them before using.  Ferment flours before baking, such as using yeast in bread dough. WHAT FOODS CAN I EAT? Grains Iron-fortified breakfast cereal. Iron-fortified whole-wheat bread. Enriched rice. Sprouted grains. Vegetables Spinach. Potatoes with skin. Green peas. Broccoli. Red and green bell peppers. Fermented vegetables. Fruits Prunes. Raisins. Oranges. Strawberries. Mango. Grapefruit. Meats and Other Protein Sources Beef liver. Oysters. Beef. Shrimp. [place]. Chicken. [place]. Sardines. Chickpeas. Nuts. Tofu. Beverages Tomato juice. Fresh orange juice. Prune juice. Hibiscus tea. Fortified instant breakfast shakes. Condiments Tahini. Fermented soy sauce. Sweets and Desserts Black-strap molasses.  Other Wheat germ. The items listed above may not be a complete list of recommended foods or beverages. Contact your dietitian for more options. WHAT FOODS ARE NOT RECOMMENDED? Grains Whole grains. Bran cereal. Bran flour. Oats. Vegetables Artichokes. Brussels sprouts. Kale. Fruits Blueberries. Raspberries. Strawberries. Figs. Meats and Other Protein Sources Soybeans. Products made from soy protein. Dairy Milk. Cream. Cheese. Yogurt. Cottage cheese. Beverages Coffee. Black tea. Red wine. Sweets and Desserts Cocoa. Chocolate. Ice cream. Other Basil. Oregano.  Parsley. The items listed above may not be a complete list of foods and beverages to avoid. Contact your dietitian for more information.   This information is not intended to replace advice given to you by your health care provider. Make sure you discuss any questions you have with your health care provider.   Document Released: 04/08/2005 Document Revised: 09/15/2014 Document Reviewed: 03/22/2014 Elsevier Interactive Patient Education [employer].    IF you received an x-Perry today, you  will receive an invoice from [facility]. Please contact [facility] at [phone] with questions or concerns regarding your invoice.   IF you received labwork today, you will receive an invoice from [employer]. Please contact [facility] at [phone] with questions or concerns regarding your invoice.   Our billing staff will not be able to assist you with questions regarding bills from these companies.  You will be contacted with the lab results as soon as they are available. The fastest way to get your results is to activate your My Chart account. Instructions are located on the last page of this paperwork. If you have not heard from [place] regarding the results in 2 weeks, please contact this office.     Influenza (Flu) Vaccine (Inactivated or Recombinant):  1. Why get vaccinated? Influenza ("flu") is a contagious disease that spreads around the [place] every year, usually between October and May. Flu is caused by influenza viruses, and is spread mainly by coughing, sneezing, and close contact. Anyone can get flu. Flu strikes suddenly and can last several days. Symptoms vary by age, but can include:  fever/chills  sore throat  muscle aches  fatigue  cough  headache  runny or stuffy nose Flu can also lead to pneumonia and Perry infections, and cause diarrhea and seizures in children. If you have a medical condition, such as heart or lung  disease, flu can make it worse. Flu is more dangerous for some people. Infants and young children, people 74 years of age and older, pregnant women, and people with certain health conditions or a weakened immune system are at greatest risk. Each year thousands of people in the [place] die from flu, and many more are hospitalized. Flu vaccine can:  keep you from getting flu,  make flu less severe if you do get it, and  keep you from spreading flu to your family and other people. 2. Inactivated and recombinant flu vaccines A dose of flu vaccine is recommended every flu season. Children 6 months through 48 years of age may need two doses during the same flu season. Everyone else needs only one dose each flu season. Some inactivated flu vaccines contain a very small amount of a mercury-based preservative called thimerosal. Studies have not shown thimerosal in vaccines to be harmful, but flu vaccines that do not contain thimerosal are available. There is no live flu virus in flu shots. They cannot cause the flu. There are many flu viruses, and they are always changing. Each year a new flu vaccine is made to protect against three or four viruses that are likely to cause disease in the upcoming flu season. But even when the vaccine doesn't exactly match these viruses, it may still provide some protection. Flu vaccine cannot prevent:  flu that is caused by a virus not covered by the vaccine, or  illnesses that look like flu but are not. It takes about 2 weeks for protection to develop after vaccination, and protection lasts through the flu season. 3. Some people should not get this vaccine Tell the person who is giving you the vaccine:  If you have any severe, life-threatening allergies. If you ever had a life-threatening allergic reaction after a dose of flu vaccine, or have a severe allergy to any part of this vaccine, you may be advised not to get vaccinated. Most, but not all, types of flu  vaccine contain a small amount of egg protein.  If you ever had Guillain-Barre Syndrome (also  called GBS). Some people with a history of GBS should not get this vaccine. This should be discussed with your doctor.  If you are not feeling well. It is usually okay to get flu vaccine when you have a mild illness, but you might be asked to come back when you feel better. 4. Risks of a vaccine reaction With any medicine, including vaccines, there is a chance of reactions. These are usually mild and go away on their own, but serious reactions are also possible. Most people who get a flu shot do not have any problems with it. Minor problems following a flu shot include:  soreness, redness, or swelling where the shot was given  hoarseness  sore, red or itchy eyes  cough  fever  aches  headache  itching  fatigue If these problems occur, they usually begin soon after the shot and last 1 or 2 days. More serious problems following a flu shot can include the following:  There may be a small increased risk of Guillain-Barre Syndrome (GBS) after inactivated flu vaccine. This risk has been estimated at 1 or 2 additional cases per million people vaccinated. This is much lower than the risk of severe complications from flu, which can be prevented by flu vaccine.  Young children who get the flu shot along with pneumococcal vaccine (PCV13) and/or DTaP vaccine at the same time might be slightly more likely to have a seizure caused by fever. Ask your doctor for more information. Tell your doctor if a child who is getting flu vaccine has ever had a seizure. Problems that could happen after any injected vaccine:  People sometimes faint after a medical procedure, including vaccination. Sitting or lying down for about 15 minutes can help prevent fainting, and injuries caused by a fall. Tell your doctor if you feel dizzy, or have vision changes or ringing in the ears.  Some people get severe pain in the  shoulder and have difficulty moving the arm where a shot was given. This happens very rarely.  Any medication can cause a severe allergic reaction. Such reactions from a vaccine are very rare, estimated at about 1 in a million doses, and would happen within a few minutes to a few hours after the vaccination. As with any medicine, there is a very remote chance of a vaccine causing a serious injury or death. The safety of vaccines is always being monitored. For more information, visit: http://www.aguilar.org/ 5. What if there is a serious reaction? What should I look for?  Look for anything that concerns you, such as signs of a severe allergic reaction, very high fever, or unusual behavior. Signs of a severe allergic reaction can include hives, swelling of the face and throat, difficulty breathing, a fast heartbeat, dizziness, and weakness. These would start a few minutes to a few hours after the vaccination. What should I do?  If you think it is a severe allergic reaction or other emergency that can't wait, call 9-1-1 and get the person to the nearest hospital. Otherwise, call your doctor.  Reactions should be reported to the Vaccine Adverse Event Reporting System (VAERS). Your doctor should file this report, or you can do it yourself through the VAERS web site at www.vaers.SamedayNews.es, or by calling 340-849-5137. VAERS does not give medical advice. 6. The National Vaccine Injury Compensation Program The [employer] Vaccine Injury Compensation Program (VICP) is a federal program that was created to compensate people who may have been injured by certain vaccines. Persons who believe they  may have been injured by a vaccine can learn about the program and about filing a claim by calling 7545221902 or visiting the [place] website at GoldCloset.com.ee. There is a time limit to file a claim for compensation. 7. How can I learn more?  Ask your healthcare provider. He or she can give you  the vaccine package insert or suggest other sources of information.  Call your local or state health department.  Contact the Centers for Disease Control and Prevention (CDC):  Call (951) 580-6773 (1-800-CDC-INFO) or  Visit CDC's website at https://gibson.com/ Vaccine Information Statement Inactivated Influenza Vaccine (04/14/2014)   This information is not intended to replace advice given to you by your health care provider. Make sure you discuss any questions you have with your health care provider.   Document Released: 06/19/2006 Document Revised: 09/15/2014 Document Reviewed: 04/17/2014 Elsevier Interactive Patient Education [employer].    I personally performed the services described in this documentation, which was scribed in my presence. The recorded information has been reviewed and considered, and addended by me as needed.   Signed,   Jackson Ray, MD [facility].  05/24/16 12:18 PM

## 2016-07-03 ENCOUNTER — Ambulatory Visit (INDEPENDENT_AMBULATORY_CARE_PROVIDER_SITE_OTHER): Payer: Medicare Other | Admitting: Family Medicine

## 2016-07-03 ENCOUNTER — Ambulatory Visit (INDEPENDENT_AMBULATORY_CARE_PROVIDER_SITE_OTHER): Payer: Medicare Other

## 2016-07-03 ENCOUNTER — Encounter: Payer: Self-pay | Admitting: Family Medicine

## 2016-07-03 VITALS — BP 136/80 | HR 78 | Temp 97.6°F | Resp 18 | Ht 68.5 in | Wt 156.0 lb

## 2016-07-03 DIAGNOSIS — D509 Iron deficiency anemia, unspecified: Secondary | ICD-10-CM | POA: Diagnosis not present

## 2016-07-03 DIAGNOSIS — Z113 Encounter for screening for infections with a predominantly sexual mode of transmission: Secondary | ICD-10-CM | POA: Diagnosis not present

## 2016-07-03 DIAGNOSIS — Z1383 Encounter for screening for respiratory disorder NEC: Secondary | ICD-10-CM | POA: Diagnosis not present

## 2016-07-03 DIAGNOSIS — Z0000 Encounter for general adult medical examination without abnormal findings: Secondary | ICD-10-CM | POA: Diagnosis not present

## 2016-07-03 DIAGNOSIS — Z136 Encounter for screening for cardiovascular disorders: Secondary | ICD-10-CM

## 2016-07-03 DIAGNOSIS — Z1389 Encounter for screening for other disorder: Secondary | ICD-10-CM | POA: Diagnosis not present

## 2016-07-03 DIAGNOSIS — Z125 Encounter for screening for malignant neoplasm of prostate: Secondary | ICD-10-CM

## 2016-07-03 DIAGNOSIS — Z1211 Encounter for screening for malignant neoplasm of colon: Secondary | ICD-10-CM | POA: Diagnosis not present

## 2016-07-03 DIAGNOSIS — M79644 Pain in right finger(s): Secondary | ICD-10-CM | POA: Diagnosis not present

## 2016-07-03 DIAGNOSIS — Z1329 Encounter for screening for other suspected endocrine disorder: Secondary | ICD-10-CM | POA: Diagnosis not present

## 2016-07-03 DIAGNOSIS — Z23 Encounter for immunization: Secondary | ICD-10-CM | POA: Diagnosis not present

## 2016-07-03 DIAGNOSIS — E785 Hyperlipidemia, unspecified: Secondary | ICD-10-CM

## 2016-07-03 DIAGNOSIS — E538 Deficiency of other specified B group vitamins: Secondary | ICD-10-CM

## 2016-07-03 DIAGNOSIS — I10 Essential (primary) hypertension: Secondary | ICD-10-CM | POA: Diagnosis not present

## 2016-07-03 DIAGNOSIS — Z1212 Encounter for screening for malignant neoplasm of rectum: Secondary | ICD-10-CM | POA: Diagnosis not present

## 2016-07-03 DIAGNOSIS — M19041 Primary osteoarthritis, right hand: Secondary | ICD-10-CM | POA: Diagnosis not present

## 2016-07-03 LAB — CBC
HEMATOCRIT: 36.4 % — AB (ref 38.5–50.0)
Hemoglobin: 11.5 g/dL — ABNORMAL LOW (ref 13.2–17.1)
MCH: 25.3 pg — ABNORMAL LOW (ref 27.0–33.0)
MCHC: 31.6 g/dL — ABNORMAL LOW (ref 32.0–36.0)
MCV: 80 fL (ref 80.0–100.0)
MPV: 9.6 fL (ref 7.5–12.5)
Platelets: 334 10*3/uL (ref 140–400)
RBC: 4.55 MIL/uL (ref 4.20–5.80)
RDW: 18.7 % — AB (ref 11.0–15.0)
WBC: 9.2 10*3/uL (ref 3.8–10.8)

## 2016-07-03 LAB — POCT URINALYSIS DIP (MANUAL ENTRY)
BILIRUBIN UA: NEGATIVE
Blood, UA: NEGATIVE
GLUCOSE UA: NEGATIVE
LEUKOCYTES UA: NEGATIVE
Nitrite, UA: NEGATIVE
PROTEIN UA: NEGATIVE
Spec Grav, UA: 1.015
Urobilinogen, UA: 1
pH, UA: 7

## 2016-07-03 LAB — LIPID PANEL
CHOL/HDL RATIO: 6.2 ratio — AB (ref <=5.0)
Cholesterol: 197 mg/dL (ref 125–200)
HDL: 32 mg/dL — AB (ref >=40)
LDL CALC: 147 mg/dL — AB (ref <130)
Triglycerides: 90 mg/dL (ref <150)
VLDL: 18 mg/dL (ref <30)

## 2016-07-03 LAB — COMPREHENSIVE METABOLIC PANEL
ALK PHOS: 67 U/L (ref 40–115)
ALT: 11 U/L (ref 9–46)
AST: 15 U/L (ref 10–35)
Albumin: 3.9 g/dL (ref 3.6–5.1)
BILIRUBIN TOTAL: 0.5 mg/dL (ref 0.2–1.2)
BUN: 13 mg/dL (ref 7–25)
CALCIUM: 8.5 mg/dL — AB (ref 8.6–10.3)
CO2: 24 mmol/L (ref 20–31)
Chloride: 106 mmol/L (ref 98–110)
Creat: 0.87 mg/dL (ref 0.70–1.18)
GLUCOSE: 90 mg/dL (ref 65–99)
Potassium: 4.2 mmol/L (ref 3.5–5.3)
Sodium: 138 mmol/L (ref 135–146)
TOTAL PROTEIN: 6.8 g/dL (ref 6.1–8.1)

## 2016-07-03 LAB — HEPATITIS C ANTIBODY: HCV Ab: NEGATIVE

## 2016-07-03 LAB — VITAMIN B12: Vitamin B-12: 519 pg/mL (ref 200–1100)

## 2016-07-03 LAB — PSA: PSA: 1.6 ng/mL (ref <=4.0)

## 2016-07-03 MED ORDER — ALPRAZOLAM 0.5 MG PO TABS: 1.0000 mg | ORAL_TABLET | Freq: Every evening | ORAL | 3 refills | Status: DC | PRN

## 2016-07-03 MED ORDER — HYDROCHLOROTHIAZIDE 12.5 MG PO TABS: 12.5000 mg | ORAL_TABLET | Freq: Every day | ORAL | 3 refills | Status: DC

## 2016-07-03 MED ORDER — CYANOCOBALAMIN 1000 MCG/ML IJ SOLN: INTRAMUSCULAR | 3 refills | Status: DC

## 2016-07-03 NOTE — Progress Notes (Addendum)
Subjective:    Jackson Perry is a 70 y.o. male who presents for Medicare Annual/Subsequent preventive examination.  Pt was last seen 6 wks prior by my colleague Dr. Carlota Raspberry.   Preventive Screening-Counseling & Management  Tobacco History  Smoking Status  . Former Smoker  . Types: Cigarettes  . Quit date: 11/05/2003  Smokeless Tobacco  . Not on file    Problems Prior to Visit 1. HPL - Pt was on Crestor for years but stopped in 2014 due to myalgias. Lipid panel last yr gave pt a ASCVD 10 yr risk of 25% so I recommended increasing omega-3/fish oil as LDL 143, non-hdl 156 2. B12 def - self-administers injections 1014mcg q14d.  He can tell when it is ready for his next dose and it makes a huge difference - the next day after the shot. Sometimes he is able to go 3 weeks between but tries to stick to every 2 weeks. 3.  H/o anemia secondary to epistaxis in 2016 but last checked last mo revealed that pt was still anemic with hgb of 11.0 with ferritin on 11.  It is possible that pt never actually replaced the iron sig from the severe epistaxis last year but at this point he probably does need further evaluation to ensure he is not having occult blood loss.  He did take iron initially after the epistaxis and again after seeing dr. Carlota Raspberry took it once a day in the morning after eating breakfast for several weeks. 4. H/o shingles - long time ago, has not had shingles vaccine. 5. H/o HTN - was on enalapril-hctz prior but was able to go off for sev yrs.  Last mo was advised to restart on hctz 12.5 if BP > 140/90 at home. 6. Had cataract surgery last mo 7. Insomnia - prn xanax - often takes a half but most days. Doesn't sleep much and tends to get up at 4:45 8. Rt> Lt thumb pain with clicking worse for the past month.  Current Problems (verified) Patient Active Problem List   Diagnosis Date Noted  . Vitamin B12 deficiency 04/26/2009  . Hyperlipidemia 04/26/2009  . Essential hypertension 04/26/2009     Medications Prior to Visit Current Outpatient Prescriptions on File Prior to Visit  Medication Sig Dispense Refill  . ALPRAZolam (XANAX) 0.5 MG tablet Take 2 tablets (1 mg total) by mouth at bedtime as needed for sleep. 30 tablet 1  . aspirin 81 MG tablet Take 81 mg by mouth daily.    . cyanocobalamin (,VITAMIN B-12,) 1000 MCG/ML injection Inject 1 mL every 14 days 10 mL 3  . hydrochlorothiazide (HYDRODIURIL) 12.5 MG tablet Take 1 tablet (12.5 mg total) by mouth daily. 30 tablet 1   No current facility-administered medications on file prior to visit.     Current Medications (verified) Current Outpatient Prescriptions  Medication Sig Dispense Refill  . ALPRAZolam (XANAX) 0.5 MG tablet Take 2 tablets (1 mg total) by mouth at bedtime as needed for sleep. 30 tablet 1  . aspirin 81 MG tablet Take 81 mg by mouth daily.    . cyanocobalamin (,VITAMIN B-12,) 1000 MCG/ML injection Inject 1 mL every 14 days 10 mL 3  . hydrochlorothiazide (HYDRODIURIL) 12.5 MG tablet Take 1 tablet (12.5 mg total) by mouth daily. 30 tablet 1   No current facility-administered medications for this visit.      Allergies (verified) Bupropion hcl   PAST HISTORY  Family History Family History  Problem Relation Age of Onset  .  Heart disease Father   . Diabetes Father   . Cancer Father     Social History Social History  Substance Use Topics  . Smoking status: Former Smoker    Types: Cigarettes    Quit date: 11/05/2003  . Smokeless tobacco: Not on file  . Alcohol use Not on file    Are there smokers in your home (other than you)?  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  Depression Screen Depression screen [facility] 2/9 05/22/2016 07/10/2015 06/30/2015  02/10/2014  Decreased Interest 0 0 0 0  Down, Depressed, Hopeless 0 0 0 0  PHQ - 2 Score 0 0 0 0   (Note: if answer to either of the following is "Yes", a more complete depression screening is indicated)   Q1: Over the past two weeks, have you felt down, depressed or hopeless? No  Q2: Over the past two weeks, have you felt little interest or pleasure in doing things? No  Have you lost interest or pleasure in daily life? No  Do you often feel hopeless? No  Do you cry easily over simple problems? No  Activities of Daily Living In your present state of health, do you have any difficulty performing the following activities?:  Driving? No Managing money?  No Feeding yourself? No Getting from bed to chair? No Climbing a flight of stairs? No Preparing food and eating?: No Bathing or showering? No Getting dressed: No Getting to the toilet? No Using the toilet:No Moving around from place to place: No In the past year have you fallen or had a near fall?:No   Are you sexually active?  Yes  Do you have more than one partner?  No  Hearing Difficulties: No Do you often ask people to speak up or repeat themselves? No Do you experience ringing or noises in your ears? No Do you have difficulty understanding soft or whispered voices? No   Do you feel that you have a problem with memory? No  Do you often misplace items? No  Do you feel safe at home?  Yes  Cognitive Testing  Alert? Yes  Normal Appearance?Yes  Oriented to person? Yes  Place? Yes   Time? Yes  Recall of three objects?  Yes  Can perform simple calculations? Yes  Displays appropriate judgment?Yes  Can read the correct time from a watch face?Yes   Advanced Directives have been discussed with the patient? Yes   List the Names of Other Physician/Practitioners you currently use: 1.  none  Indicate any recent Medical Services you may have received from other than [facility] providers in the past year (date may be  approximate).  Immunization History  Administered Date(s) Administered  . Influenza,inj,Quad PF,36+ Mos 07/10/2015, 05/22/2016  . Pneumococcal Polysaccharide-23 02/04/2013  . Tetanus 02/04/2013    Screening Tests Health Maintenance  Topic Date Due  . Hepatitis C Screening  01-30-1946  . ZOSTAVAX  04/24/2005  . PNA vac Low Risk Adult (2 of 2 - PCV13) 02/04/2014  . COLONOSCOPY  05/09/2014  . TETANUS/TDAP  02/05/2023  . INFLUENZA VACCINE  Completed    All answers were reviewed with the patient and necessary referrals were made:  SHAW,EVA, MD   07/03/2016   History reviewed: allergies, current medications, past family history, past medical  history, past social history, past surgical history and problem list  Review of Systems  Musculoskeletal:positive for myalgias, joint pain All 14 point ROS was negative unless noted otherwise   Objective:    BP 136/80 (BP Location: Right Arm, Patient Position: Sitting, Cuff Size: Small)   Pulse 78   Temp 97.6 F (36.4 C) (Oral)   Resp 18   Ht 5' 8.5" (1.74 m)   Wt 156 lb (70.8 kg)   SpO2 98%   BMI 23.37 kg/m   Visual Acuity Screening   Right eye Left eye Both eyes  Without correction:     With correction: 20/15 20/25 20/20      General Appearance:    Alert, cooperative, no distress, appears stated age  Head:    Normocephalic, without obvious abnormality, atraumatic  Eyes:    PERRL, conjunctiva/corneas clear, EOM's intact, fundi    benign, both eyes       Ears:    Normal TM's and external ear canals, both ears  Nose:   Nares normal, septum midline, mucosa normal, no drainage    or sinus tenderness  Throat:   Lips, mucosa, and tongue normal; teeth and gums normal  Neck:   Supple, symmetrical, trachea midline, no adenopathy;       thyroid:  No enlargement/tenderness/nodules; no carotid   bruit or JVD  Back:     Symmetric, no curvature, ROM normal, no CVA tenderness  Lungs:     Clear to auscultation bilaterally, respirations  unlabored  Chest wall:    No tenderness or deformity  Heart:    Regular rate and rhythm, S1 and S2 normal, no murmur, rub   or gallop  Abdomen:     Soft, non-tender, bowel sounds active all four quadrants,    no masses, no organomegaly  Genitalia:     Left reducible inguinal hernia.  Rectal:    Normal tone, normal prostate, no masses or tenderness;   guaiac negative stool  Extremities:   Extremities normal, atraumatic, no cyanosis or edema  Pulses:   2+ and symmetric all extremities  Skin:   Skin color, texture, turgor normal, no rashes or lesions  Lymph nodes:   Cervical, supraclavicular, and axillary nodes normal  Neurologic:   CNII-XII intact. Normal strength, sensation and reflexes      throughout     Dg Hand Complete Right  Result Date: 07/03/2016 CLINICAL DATA:  Swelling and pain over the first right MCP joint and first metacarpal. EXAM: RIGHT HAND - COMPLETE 3+ VIEW COMPARISON:  None. FINDINGS: No acute fracture or dislocation is identified. Advanced osteoarthrosis is noted involving the thumb IP joint and the DIP joints of each finger with marked osteophytosis and joint space narrowing. Prominent degenerative change is also seen at the first [facility] joint. No focal osseous lesion or soft tissue abnormality is seen. IMPRESSION: 1. No evidence of acute osseous abnormality. 2. Advanced osteoarthrosis. Electronically Signed   By: Logan Bores M.D.   On: 07/03/2016 11:18      Assessment:     1. Medicare annual wellness visit, subsequent   2. Screening for cardiovascular, respiratory, and genitourinary diseases   3. Screening for colorectal cancer   4. Screening for prostate cancer   5. Screening for thyroid disorder   6. Routine screening for STI (sexually transmitted infection)   7. Essential hypertension   8. Vitamin B12 deficiency   9. Hyperlipidemia, unspecified hyperlipidemia type   10. Iron deficiency anemia, unspecified iron deficiency anemia type - check ferritin  with next  labs  11. Thumb pain, right - xray shows OA.  Try thumb spica splint, if sxs persist - rec hand surgery eval.        Plan:   Cons tsh and ferritin with next labs  During the course of the visit the patient was educated and counseled about appropriate screening and preventive services including:    Immunizations: Td 2014, pneumovax-23 2014, flu done last mo. Prevnar-13 today. Sent rx for zostavax to pharm.  Screening electrocardiogram - done at prev physical   Prostate cancer screening - done today  Colorectal cancer screening - pt has had 2 colonoscopies, one did have some polyps. He does not want to do any further colonscopies but is willing check HOC due to recent anemia  Advanced directives: power of attorney for healthcare on file   Diet review for nutrition referral? Yes ____  Not Indicated __X__  1. HPL -  pt's only risk factors are age, male, and well controlled HTN. Crestor was causing myalgias. Consider trial of different statin with coenzyme q10.  2. Vit b12 def -  self-administered 1050mcg injection q2 wks  3. Anxiety - well controlled on rare prn xanax    Patient Instructions (the written plan) was given to the patient.  Medicare Attestation I have personally reviewed: The patient's medical and social history Their use of alcohol, tobacco or illicit drugs Their current medications and supplements The patient's functional ability including ADLs,fall risks, home safety risks, cognitive, and hearing and visual impairment Diet and physical activities Evidence for depression or mood disorders  The patient's weight, height, BMI, and visual acuity have been recorded in the chart.  I have made referrals, counseling, and provided education to the patient based on review of the above and I have provided the patient with a written personalized care plan for preventive services.     Delman Cheadle, MD   07/03/2016         Orders Placed This Encounter  Procedures  .  DG Hand Complete Right    Standing Status:   Future    Number of Occurrences:   1    Standing Expiration Date:   07/03/2017    Order Specific Question:   Reason for Exam (SYMPTOM  OR DIAGNOSIS REQUIRED)    Answer:   swelling and pain over first right MCP and first metacarpal    Order Specific Question:   Preferred imaging location?    Answer:   External  . Pneumococcal conjugate vaccine 13-valent IM  . Hepatitis C Antibody  . Vitamin B12  . CBC  . Comprehensive metabolic panel    Order Specific Question:   Has the patient fasted?    Answer:   Yes  . Lipid panel    Order Specific Question:   Has the patient fasted?    Answer:   Yes  . PSA  . POCT urinalysis dipstick  . POC Hemoccult Bld/Stl (3-Cd Home Screen)    Standing Status:   Future    Standing Expiration Date:   07/03/2017    Meds ordered this encounter  Medications  . hydrochlorothiazide (HYDRODIURIL) 12.5 MG tablet    Sig: Take 1 tablet (12.5 mg total) by mouth daily.    Dispense:  90 tablet    Refill:  3  . ALPRAZolam (XANAX) 0.5 MG tablet    Sig: Take 2 tablets (1 mg total) by mouth at bedtime as needed for sleep.    Dispense:  60 tablet  Refill:  3  . cyanocobalamin (,VITAMIN B-12,) 1000 MCG/ML injection    Sig: Inject 1 mL every 14 days    Dispense:  10 mL    Refill:  3    Delman Cheadle, M.D.  [facility] [address] [place], [place] [address] [phone] phone [phone] fax  07/07/16 12:08 PM

## 2016-07-03 NOTE — Patient Instructions (Signed)
     IF you received an x-ray today, you will receive an invoice from [facility]. Please contact [facility] at [phone] with questions or concerns regarding your invoice.   IF you received labwork today, you will receive an invoice from [facility]. Please contact [facility] at [phone] with questions or concerns regarding your invoice.   Our billing staff will not be able to assist you with questions regarding bills from these companies.  You will be contacted with the lab results as soon as they are available. The fastest way to get your results is to activate your My Chart account. Instructions are located on the last page of this paperwork. If you have not heard from us regarding the results in 2 weeks, please contact this office.      

## 2016-07-07 MED ORDER — ZOSTER VACCINE LIVE 19400 UNT/0.65ML ~~LOC~~ SUSR: 0.6500 mL | Freq: Once | SUBCUTANEOUS | 0 refills | Status: AC

## 2016-07-07 NOTE — Addendum Note (Signed)
Addended by: Delman Cheadle on: 07/07/2016 03:58 PM   Modules accepted: Orders

## 2016-07-08 LAB — POC HEMOCCULT BLD/STL (HOME/3-CARD/SCREEN)
Card #2 Fecal Occult Blod, POC: NEGATIVE
FECAL OCCULT BLD: NEGATIVE
FECAL OCCULT BLD: NEGATIVE

## 2016-07-08 NOTE — Addendum Note (Signed)
Addended by: Burnis Kingfisher on: 07/08/2016 05:39 PM   Modules accepted: Orders

## 2016-07-16 DIAGNOSIS — H2512 Age-related nuclear cataract, left eye: Secondary | ICD-10-CM | POA: Diagnosis not present

## 2016-07-16 DIAGNOSIS — H25812 Combined forms of age-related cataract, left eye: Secondary | ICD-10-CM | POA: Diagnosis not present

## 2016-07-16 DIAGNOSIS — H25042 Posterior subcapsular polar age-related cataract, left eye: Secondary | ICD-10-CM | POA: Diagnosis not present

## 2016-07-24 ENCOUNTER — Ambulatory Visit (INDEPENDENT_AMBULATORY_CARE_PROVIDER_SITE_OTHER): Payer: Medicare Other | Admitting: Orthopaedic Surgery

## 2016-07-25 ENCOUNTER — Encounter (INDEPENDENT_AMBULATORY_CARE_PROVIDER_SITE_OTHER): Payer: Self-pay | Admitting: Orthopaedic Surgery

## 2016-07-25 ENCOUNTER — Ambulatory Visit (INDEPENDENT_AMBULATORY_CARE_PROVIDER_SITE_OTHER): Payer: Medicare Other | Admitting: Orthopaedic Surgery

## 2016-07-25 VITALS — Ht 68.0 in | Wt 160.0 lb

## 2016-07-25 DIAGNOSIS — M1811 Unilateral primary osteoarthritis of first carpometacarpal joint, right hand: Secondary | ICD-10-CM

## 2016-07-25 MED ORDER — DICLOFENAC SODIUM 1 % TD GEL: 2.0000 g | Freq: Four times a day (QID) | TRANSDERMAL | 5 refills | Status: DC

## 2016-07-25 NOTE — Progress Notes (Signed)
Office Visit Note   Patient: Jackson Perry           Date of Birth: 10/18/1945           MRN: [id number] Visit Date: 07/25/2016              Requested by: Shawnee Knapp, MD [address] [place], [place] [address] PCP: Delman Cheadle, MD   Assessment & Plan: Visit Diagnoses:  1. Arthritis of carpometacarpal (CMC) joint of right thumb     Plan: Discussed different treatment options including injection and Voltaren gel. Patient elects to try the Voltaren gel first. Follow-up with me as needed he has my card.  Follow-Up Instructions: Return if symptoms worsen or fail to improve.   Orders:  No orders of the defined types were placed in this encounter.  Meds ordered this encounter  Medications  . diclofenac sodium (VOLTAREN) 1 % GEL    Sig: Apply 2 g topically 4 (four) times daily.    Dispense:  1 Tube    Refill:  5      Procedures: No procedures performed   Clinical Data: No additional findings.   Subjective: Chief Complaint  Patient presents with  . Right Hand - Pain  . Left Hand - Pain  . Left Thumb - Pain  . Right Thumb - Pain    Patient is a very pleasant 70 year old gentleman with right thumb pain that has gotten worse over the last 3 months. He feels that he is losing strength and pinching gripping and as a result is dropping objects. He denies any triggering. He endorses tingling burning and numbness with the pain is occasional. He's been taking Aleve and wearing a brace which does not help.    Review of Systems  Constitutional: Negative.   HENT: Negative.   Eyes: Negative.   Respiratory: Negative.   Cardiovascular: Negative.   Gastrointestinal: Negative.   Endocrine: Negative.   Genitourinary: Negative.   Musculoskeletal: Negative.   Skin: Negative.   Allergic/Immunologic: Negative.   Neurological: Negative.   Hematological: Negative.   Psychiatric/Behavioral: Negative.      Objective: Vital Signs: Ht 5\' 8"  (1.727 m)   Wt 160 lb (72.6 kg)   BMI  24.33 kg/m   Physical Exam  Constitutional: He is oriented to person, place, and time. He appears well-developed and well-nourished.  HENT:  Head: Normocephalic and atraumatic.  Eyes: EOM are normal.  Neck: Neck supple.  Cardiovascular: Intact distal pulses.   Pulmonary/Chest: Effort normal.  Abdominal: Soft.  Neurological: He is alert and oriented to person, place, and time.  Skin: Skin is warm.  Psychiatric: He has a normal mood and affect. His behavior is normal. Judgment and thought content normal.  Nursing note and vitals reviewed.   Ortho Exam Exam of both hands show characteristic osteoarthritis patterns with Heberden and Bouchard nodes. He has prominent basal joint of the right thumb. Positive grind test. 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 Scaphoid is nontender. Specialty Comments:  No specialty comments available.  Imaging: 3 view x-rays of bilateral hands from [facility] in apparently reviewed and interpreted shows advanced degenerative joint disease of the PIP and DIP joints as well as the thumb basal joint.   PMFS History: Patient Active Problem List   Diagnosis Date Noted  . Arthritis of carpometacarpal [facility]) joint of right thumb 07/25/2016  . Vitamin B12 deficiency 04/26/2009  . Hyperlipidemia 04/26/2009  . Essential hypertension 04/26/2009   Past Medical History:  Diagnosis Date  .  Anemia   . Depression     Family History  Problem Relation Age of Onset  . Heart disease Father   . Diabetes Father   . Cancer Father     Past Surgical History:  Procedure Laterality Date  . CHOLECYSTECTOMY     Social History   Occupational History  . Not on file.   Social History Main Topics  . Smoking status: Former Smoker    Types: Cigarettes    Quit date: 11/05/2003  . Smokeless tobacco: Never Used  . Alcohol use Not on file  . Drug use: Unknown  . Sexual activity: Not on file

## 2016-11-24 DIAGNOSIS — L918 Other hypertrophic disorders of the skin: Secondary | ICD-10-CM | POA: Diagnosis not present

## 2016-11-24 DIAGNOSIS — L820 Inflamed seborrheic keratosis: Secondary | ICD-10-CM | POA: Diagnosis not present

## 2016-11-24 DIAGNOSIS — L570 Actinic keratosis: Secondary | ICD-10-CM | POA: Diagnosis not present

## 2016-11-24 DIAGNOSIS — L438 Other lichen planus: Secondary | ICD-10-CM | POA: Diagnosis not present

## 2017-01-21 ENCOUNTER — Encounter: Payer: Self-pay | Admitting: Emergency Medicine

## 2017-01-21 ENCOUNTER — Ambulatory Visit (INDEPENDENT_AMBULATORY_CARE_PROVIDER_SITE_OTHER): Payer: Medicare Other | Admitting: Emergency Medicine

## 2017-01-21 VITALS — BP 157/85 | HR 73 | Temp 98.7°F | Resp 18 | Ht 68.0 in | Wt 147.0 lb

## 2017-01-21 DIAGNOSIS — L03818 Cellulitis of other sites: Secondary | ICD-10-CM

## 2017-01-21 MED ORDER — CEFADROXIL 500 MG PO CAPS: 500.0000 mg | ORAL_CAPSULE | Freq: Two times a day (BID) | ORAL | 0 refills | Status: AC

## 2017-01-21 NOTE — Progress Notes (Signed)
Jackson Perry 71 y.o.   Chief Complaint  Patient presents with  . Insect Bite    right hip and has spread down to leg x1 week     HISTORY OF PRESENT ILLNESS: This is a 71 y.o. male complaining of right hip insect bite 10 days ago; redness spreading; not getting better.  HPI   Prior to Admission medications   Medication Sig Start Date End Date Taking? Authorizing Provider  ALPRAZolam Jackson Perry) 0.5 MG tablet Take 2 tablets (1 mg total) by mouth at bedtime as needed for sleep. 07/03/16  Yes Jackson Knapp, Perry  aspirin 81 MG tablet Take 81 mg by mouth daily.   Yes Jackson Perry  cyanocobalamin (,VITAMIN B-12,) 1000 MCG/ML injection Inject 1 mL every 14 days 07/03/16  Yes Jackson Knapp, Perry  diclofenac sodium (VOLTAREN) 1 % GEL Apply 2 g topically 4 (four) times daily. 07/25/16  Yes Jackson Koyanagi, Perry  hydrochlorothiazide (HYDRODIURIL) 12.5 MG tablet Take 1 tablet (12.5 mg total) by mouth daily. 07/03/16  Yes Jackson Knapp, Perry    Allergies  Allergen Reactions  . Bupropion Hcl   . Shrimp [Shellfish Allergy]     Patient Active Problem List   Diagnosis Date Noted  . Arthritis of carpometacarpal [facility]) joint of right thumb 07/25/2016  . Vitamin B12 deficiency 04/26/2009  . Hyperlipidemia 04/26/2009  . Essential hypertension 04/26/2009    Past Medical History:  Diagnosis Date  . Anemia   . Depression     Past Surgical History:  Procedure Laterality Date  . CHOLECYSTECTOMY      Social History   Social History  . Marital status: Married    Spouse name: N/A  . Number of children: N/A  . Years of education: N/A   Occupational History  . Not on file.   Social History Main Topics  . Smoking status: Former Smoker    Types: Cigarettes    Quit date: 11/05/2003  . Smokeless tobacco: Never Used  . Alcohol use Not on file  . Drug use: Unknown  . Sexual activity: Not on file   Other Topics Concern  . Not on file   Social History Narrative  . No narrative on file     Family History  Problem Relation Age of Onset  . Heart disease Father   . Diabetes Father   . Cancer Father      Review of Systems  Constitutional: Negative.  Negative for chills and fever.  HENT: Negative.   Eyes: Negative.   Respiratory: Negative.  Negative for shortness of breath.   Cardiovascular: Negative for chest pain, palpitations and leg swelling.  Gastrointestinal: Negative.  Negative for nausea and vomiting.  Genitourinary: Negative for dysuria and hematuria.  Musculoskeletal: Negative for back pain, joint pain and myalgias.  Skin: Positive for rash.  Neurological: Negative for dizziness and headaches.  Endo/Heme/Allergies: Negative.   All other systems reviewed and are negative.  Vitals:   01/21/17 1000  BP: (!) 157/85  Pulse: 73  Resp: 18  Temp: 98.7 F (37.1 C)     Physical Exam  Constitutional: He is oriented to person, place, and time. He appears well-developed and well-nourished.  HENT:  Head: Normocephalic and atraumatic.  Nose: Nose normal.  Mouth/Throat: Oropharynx is clear and moist. No oropharyngeal exudate.  Eyes: Conjunctivae and EOM are normal. Pupils are equal, round, and reactive to light.  Neck: Normal range of motion. Neck supple. No JVD present. No thyromegaly present.  Cardiovascular:  Normal rate, regular rhythm and normal heart sounds.   Pulmonary/Chest: Effort normal and breath sounds normal.  Abdominal: Soft. Bowel sounds are normal. He exhibits no distension. There is no tenderness.  Musculoskeletal: Normal range of motion.  Lymphadenopathy:    He has no cervical adenopathy.  Neurological: He is alert and oriented to person, place, and time. No sensory deficit. He exhibits normal muscle tone.  Skin: Skin is warm and dry. Capillary refill takes less than 2 seconds. Rash noted.  Right hip area: +erythema surrounding insect bite and spreading vertically; no fluctuation and no crepitus.  Psychiatric: He has a normal mood and  affect. His behavior is normal.  Vitals reviewed.    ASSESSMENT & PLAN: Jackson Perry was seen today for insect bite.  Diagnoses and all orders for this visit:  Cellulitis of other specified site  Other orders -     cefadroxil (DURICEF) 500 MG capsule; Take 1 capsule (500 mg total) by mouth 2 (two) times daily.    Patient Instructions       IF you received an x-ray today, you will receive an invoice from [facility]. Please contact [facility] at [phone] with questions or concerns regarding your invoice.   IF you received labwork today, you will receive an invoice from [place]. Please contact [facility] at [phone] with questions or concerns regarding your invoice.   Our billing staff will not be able to assist you with questions regarding bills from these companies.  You will be contacted with the lab results as soon as they are available. The fastest way to get your results is to activate your My Chart account. Instructions are located on the last page of this paperwork. If you have not heard from [place] regarding the results in 2 weeks, please contact this office.      Cellulitis, Adult Cellulitis is a skin infection. The infected area is usually red and sore. This condition occurs most often in the arms and lower legs. It is very important to get treated for this condition. Follow these instructions at home:  Take over-the-counter and prescription medicines only as told by your doctor.  If you were prescribed an antibiotic medicine, take it as told by your doctor. Do not stop taking the antibiotic even if you start to feel better.  Drink enough fluid to keep your pee (urine) clear or pale yellow.  Do not touch or rub the infected area.  Raise (elevate) the infected area above the level of your heart while you are sitting or lying down.  Place warm or cold wet cloths (warm or cold compresses) on the infected area. Do this as told by your  doctor.  Keep all follow-up visits as told by your doctor. This is important. These visits let your doctor make sure your infection is not getting worse. Contact a doctor if:  You have a fever.  Your symptoms do not get better after 1-2 days of treatment.  Your bone or joint under the infected area starts to hurt after the skin has healed.  Your infection comes back. This can happen in the same area or another area.  You have a swollen bump in the infected area.  You have new symptoms.  You feel ill and also have muscle aches and pains. Get help right away if:  Your symptoms get worse.  You feel very sleepy.  You throw up (vomit) or have watery poop (diarrhea) for a long time.  There are red streaks coming from the  infected area.  Your red area gets larger.  Your red area turns darker. This information is not intended to replace advice given to you by your health care provider. Make sure you discuss any questions you have with your health care provider. Document Released: 02/11/2008 Document Revised: 01/31/2016 Document Reviewed: 07/04/2015 Elsevier Interactive Patient Education  2017 Elsevier Inc.      Agustina Caroli, [facility] [place] [facility]

## 2017-01-21 NOTE — Patient Instructions (Addendum)
     IF you received an x-ray today, you will receive an invoice from [facility]. Please contact [facility] at [phone] with questions or concerns regarding your invoice.   IF you received labwork today, you will receive an invoice from [facility]. Please contact [facility] at [phone] with questions or concerns regarding your invoice.   Our billing staff will not be able to assist you with questions regarding bills from these companies.  You will be contacted with the lab results as soon as they are available. The fastest way to get your results is to activate your My Chart account. Instructions are located on the last page of this paperwork. If you have not heard from us regarding the results in 2 weeks, please contact this office.     Cellulitis, Adult Cellulitis is a skin infection. The infected area is usually red and sore. This condition occurs most often in the arms and lower legs. It is very important to get treated for this condition. Follow these instructions at home:  Take over-the-counter and prescription medicines only as told by your doctor.  If you were prescribed an antibiotic medicine, take it as told by your doctor. Do not stop taking the antibiotic even if you start to feel better.  Drink enough fluid to keep your pee (urine) clear or pale yellow.  Do not touch or rub the infected area.  Raise (elevate) the infected area above the level of your heart while you are sitting or lying down.  Place warm or cold wet cloths (warm or cold compresses) on the infected area. Do this as told by your doctor.  Keep all follow-up visits as told by your doctor. This is important. These visits let your doctor make sure your infection is not getting worse. Contact a doctor if:  You have a fever.  Your symptoms do not get better after 1-2 days of treatment.  Your bone or joint under the infected area starts to hurt after the skin has healed.  Your  infection comes back. This can happen in the same area or another area.  You have a swollen bump in the infected area.  You have new symptoms.  You feel ill and also have muscle aches and pains. Get help right away if:  Your symptoms get worse.  You feel very sleepy.  You throw up (vomit) or have watery poop (diarrhea) for a long time.  There are red streaks coming from the infected area.  Your red area gets larger.  Your red area turns darker. This information is not intended to replace advice given to you by your health care provider. Make sure you discuss any questions you have with your health care provider. Document Released: 02/11/2008 Document Revised: 01/31/2016 Document Reviewed: 07/04/2015 Elsevier Interactive Patient Education  2017 Elsevier Inc.  

## 2017-02-17 DIAGNOSIS — H5310 Unspecified subjective visual disturbances: Secondary | ICD-10-CM | POA: Diagnosis not present

## 2017-02-19 DIAGNOSIS — H5310 Unspecified subjective visual disturbances: Secondary | ICD-10-CM | POA: Diagnosis not present

## 2017-07-23 ENCOUNTER — Telehealth: Payer: Self-pay | Admitting: Family Medicine

## 2017-07-23 NOTE — Telephone Encounter (Signed)
Copied from [place] [id number]. Topic: General - Other >> Jul 23, 2017  9:15 AM Darl Householder, RMA wrote: Reason for CRM: please ask Dr. Brigitte Pulse to order [place] labs patient may need before his appt on Saturday 07/25/2017, maybe he can come in later today or tomorrow for labs, please call pt at [phone] to let him know

## 2017-07-24 NOTE — Telephone Encounter (Signed)
Called pt - he has already eaten today so will just get his fasting labs at his visit tomorrow am.

## 2017-07-25 ENCOUNTER — Ambulatory Visit (INDEPENDENT_AMBULATORY_CARE_PROVIDER_SITE_OTHER): Payer: Medicare Other | Admitting: Family Medicine

## 2017-07-25 ENCOUNTER — Encounter: Payer: Self-pay | Admitting: Family Medicine

## 2017-07-25 ENCOUNTER — Ambulatory Visit (INDEPENDENT_AMBULATORY_CARE_PROVIDER_SITE_OTHER): Payer: Medicare Other

## 2017-07-25 VITALS — BP 185/96 | HR 74 | Temp 98.1°F | Resp 18 | Ht 68.25 in | Wt 150.0 lb

## 2017-07-25 DIAGNOSIS — Z0000 Encounter for general adult medical examination without abnormal findings: Secondary | ICD-10-CM

## 2017-07-25 DIAGNOSIS — N401 Enlarged prostate with lower urinary tract symptoms: Secondary | ICD-10-CM

## 2017-07-25 DIAGNOSIS — R3916 Straining to void: Secondary | ICD-10-CM | POA: Diagnosis not present

## 2017-07-25 DIAGNOSIS — M792 Neuralgia and neuritis, unspecified: Secondary | ICD-10-CM | POA: Diagnosis not present

## 2017-07-25 DIAGNOSIS — E538 Deficiency of other specified B group vitamins: Secondary | ICD-10-CM | POA: Diagnosis not present

## 2017-07-25 DIAGNOSIS — M50322 Other cervical disc degeneration at C5-C6 level: Secondary | ICD-10-CM | POA: Diagnosis not present

## 2017-07-25 DIAGNOSIS — D509 Iron deficiency anemia, unspecified: Secondary | ICD-10-CM | POA: Diagnosis not present

## 2017-07-25 DIAGNOSIS — K4090 Unilateral inguinal hernia, without obstruction or gangrene, not specified as recurrent: Secondary | ICD-10-CM | POA: Diagnosis not present

## 2017-07-25 DIAGNOSIS — I10 Essential (primary) hypertension: Secondary | ICD-10-CM | POA: Diagnosis not present

## 2017-07-25 DIAGNOSIS — M5412 Radiculopathy, cervical region: Secondary | ICD-10-CM

## 2017-07-25 DIAGNOSIS — Z23 Encounter for immunization: Secondary | ICD-10-CM

## 2017-07-25 DIAGNOSIS — E782 Mixed hyperlipidemia: Secondary | ICD-10-CM

## 2017-07-25 DIAGNOSIS — N4289 Other specified disorders of prostate: Secondary | ICD-10-CM

## 2017-07-25 LAB — POCT URINALYSIS DIP (MANUAL ENTRY)
BILIRUBIN UA: NEGATIVE
BILIRUBIN UA: NEGATIVE mg/dL
Blood, UA: NEGATIVE
GLUCOSE UA: NEGATIVE mg/dL
LEUKOCYTES UA: NEGATIVE
NITRITE UA: NEGATIVE
PH UA: 5.5 (ref 5.0–8.0)
Protein Ur, POC: NEGATIVE mg/dL
Spec Grav, UA: 1.015 (ref 1.010–1.025)
Urobilinogen, UA: 0.2 E.U./dL

## 2017-07-25 LAB — POCT CBC
Granulocyte percent: 73.6 %G (ref 37–80)
HEMATOCRIT: 39 % — AB (ref 43.5–53.7)
HEMOGLOBIN: 12.7 g/dL — AB (ref 14.1–18.1)
Lymph, poc: 1.7 (ref 0.6–3.4)
MCH: 26 pg — AB (ref 27–31.2)
MCHC: 32.5 g/dL (ref 31.8–35.4)
MCV: 80 fL (ref 80–97)
MID (CBC): 0.6 (ref 0–0.9)
MPV: 7.4 fL (ref 0–99.8)
POC GRANULOCYTE: 6.3 (ref 2–6.9)
POC LYMPH PERCENT: 19.9 %L (ref 10–50)
POC MID %: 6.5 % (ref 0–12)
Platelet Count, POC: 345 10*3/uL (ref 142–424)
RBC: 4.88 M/uL (ref 4.69–6.13)
RDW, POC: 18.6 %
WBC: 8.5 10*3/uL (ref 4.6–10.2)

## 2017-07-25 MED ORDER — PREDNISONE 10 MG PO TABS: ORAL_TABLET | ORAL | 0 refills | Status: DC

## 2017-07-25 MED ORDER — ALPRAZOLAM 0.5 MG PO TABS: 1.0000 mg | ORAL_TABLET | Freq: Every evening | ORAL | 3 refills | Status: DC | PRN

## 2017-07-25 MED ORDER — MELOXICAM 7.5 MG PO TABS: 7.5000 mg | ORAL_TABLET | Freq: Every day | ORAL | 1 refills | Status: DC

## 2017-07-25 MED ORDER — CYANOCOBALAMIN 1000 MCG/ML IJ SOLN: INTRAMUSCULAR | 3 refills | Status: DC

## 2017-07-25 MED ORDER — HYDROCHLOROTHIAZIDE 25 MG PO TABS: 25.0000 mg | ORAL_TABLET | Freq: Every day | ORAL | 0 refills | Status: DC

## 2017-07-25 NOTE — Progress Notes (Signed)
Subjective:  By signing my name below, I, [name], attest that this documentation has been prepared under the direction and in the presence of Delman Cheadle, MD. Electronically Signed: [name], [place]. 07/25/2017 , 9:08 AM .  Patient was seen in Room 3 .   Patient ID: Jackson Perry, male    DOB: 08-13-1946, 71 y.o.   MRN: [id number] Chief Complaint  Patient presents with  . Annual Exam    Lower left quadrant/groin pain x few weeks, Elevated BP pt states he did not take meds this a.m. Pt is fasting  . Flu Vaccine     65 + flu shot  . Medication Refill    all   HPI  Jackson Perry is a 71 y.o. male who presents to Primary Care at [facility] for annual physical. He is fasting today.   Possible hernia He reports left inguinal hernia, more noticeable when he's straining to urinate, started about 1-2 months ago. He notes it's more difficult to void at night; nocturia x1-2 a night.   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 Afterwards, his right shoulder started to feel achy with a tingling sensation running down his right arm. The tingling lasts until he moves it to another position. It bothers him at night and when he's sleeping, if he hits against something at night. He is right hand dominant. He denies neck pain or weakness.   Primary Preventative Screenings: Prostate Cancer: PSA 1.6 last yr STI screening: neg hep C 2017 Colorectal Cancer: neg HOC x 3 last yr. Colonoscopy done 05/2009 w/ 5 yr recall so overdue by >3 yrs. - pt has had 2 colonoscopies, one did have some polyps. He does not want to do any further colonscopies but is willing check HOC due to recent anemia Tobacco use/AAA/Lung/EtOH/Illicit substances:  Cardiac: EKG 02/2014 Weight/Blood sugar/Diet/Exercise:  BMI Readings from Last 3 Encounters:  07/25/17 22.64 kg/m  01/21/17 22.35 kg/m  07/25/16 24.33 kg/m    No results found for: HGBA1C OTC/Vit/Supp/Herbal: He only takes a baby aspirin; denies taking any other supplements.  Dentist/Optho: Immunizations:  Immunization History  Administered Date(s) Administered  . Influenza,inj,Quad PF,6+ Mos 07/10/2015, 05/22/2016  . Pneumococcal Conjugate-13 07/03/2016  . Pneumococcal Polysaccharide-23 02/04/2013  . Tetanus 02/04/2013    Chronic Medical Conditions: 1. HPL - Pt was on Crestor for years but stopped in 2014 due to myalgias. Lipid panel last yr gave pt a ASCVD 10 yr risk of 25% so I recommended increasing omega-3/fish oil as LDL 143, non-hdl 156. pt's only risk factors are age, male, and well controlled HTN.  2. B12 def - self-administers injections 1051mcg q14d.  He can tell when it is ready for his next dose and it makes a huge difference - the next day after the shot. Sometimes he is able to go 3 weeks between but tries to stick to every 2 weeks. He receives B-12 shots every 2 weeks.  3.  H/o anemia secondary to epistaxis in 2016 but last checked last mo revealed that pt was still anemic with hgb of 11.0 with ferritin on 11.  It is possible that pt never actually replaced the iron sig from the severe epistaxis last year but at this point he probably does need further evaluation to ensure he is not having occult blood loss.  He did take iron initially after the epistaxis and  again after seeing dr. Carlota Raspberry took it once a day in the morning after eating breakfast for several weeks.  - pt has had 2 colonoscopies, one did have some polyps. He does not want to do any further colonscopies but is willing check HOC due to recent anemia 4. H/o HTN - was on enalapril-hctz prior but was able to go off for sev yrs.  Last mo was advised to restart on hctz 12.5 if BP > 140/90 at home.  He hasn't been checking his BP outside of the office. He denies black tarry stools.  5. Insomnia - prn xanax - often takes a half but most days. Doesn't sleep much and tends to get  up at 4:45  Past Medical History:  Diagnosis Date  . Anemia   . Arthritis   . Depression    Prior to Admission medications   Medication Sig Start Date End Date Taking? Authorizing Provider  ALPRAZolam Duanne Moron) 0.5 MG tablet Take 2 tablets (1 mg total) by mouth at bedtime as needed for sleep. 07/03/16  Yes Shawnee Knapp, MD  aspirin 81 MG tablet Take 81 mg by mouth daily.   Yes [provider]  cyanocobalamin (,VITAMIN B-12,) 1000 MCG/ML injection Inject 1 mL every 14 days 07/03/16  Yes Shawnee Knapp, MD  diclofenac sodium (VOLTAREN) 1 % GEL Apply 2 g topically 4 (four) times daily. 07/25/16  Yes Leandrew Koyanagi, MD  hydrochlorothiazide (HYDRODIURIL) 12.5 MG tablet Take 1 tablet (12.5 mg total) by mouth daily. 07/03/16  Yes Shawnee Knapp, MD   Allergies  Allergen Reactions  . Bupropion Hcl   . Shrimp [Shellfish Allergy]    Past Surgical History:  Procedure Laterality Date  . CHOLECYSTECTOMY     Family History  Problem Relation Age of Onset  . Heart disease Father   . Diabetes Father   . Cancer Father    Social History   Socioeconomic History  . Marital status: Married    Spouse name: None  . Number of children: None  . Years of education: None  . Highest education level: None  Social Needs  . Financial resource strain: None  . Food insecurity - worry: None  . Food insecurity - inability: None  . Transportation needs - medical: None  . Transportation needs - non-medical: None  Occupational History  . None  Tobacco Use  . Smoking status: Current Every Day Smoker    Types: Cigarettes    Last attempt to quit: 11/05/2003    Years since quitting: 13.7  . Smokeless tobacco: Never Used  Substance and Sexual Activity  . Alcohol use: None  . Drug use: None  . Sexual activity: None  Other Topics Concern  . None  Social History Narrative  . None   Depression screen [facility] 2/9 07/25/2017 01/21/2017 05/22/2016 07/10/2015 06/30/2015  Decreased Interest 0 0 0 0 0  Down,  Depressed, Hopeless 0 0 0 0 0  PHQ - 2 Score 0 0 0 0 0    Review of Systems  Constitutional: Negative for fatigue and unexpected weight change.  Eyes: Negative for visual disturbance.  Respiratory: Negative for cough, chest tightness and shortness of breath.   Cardiovascular: Negative for chest pain, palpitations and leg swelling.  Gastrointestinal: Negative for abdominal pain and blood in stool.  Musculoskeletal: Positive for myalgias. Negative for neck pain.  Neurological: Negative for dizziness, weakness, light-headedness and headaches.       Objective:   Physical Exam  Constitutional: He is  oriented to person, place, and time. He appears well-developed and well-nourished. No distress.  HENT:  Head: Normocephalic and atraumatic.  Right Ear: Tympanic membrane and ear canal normal.  Nose: Rhinorrhea present.  Mouth/Throat: Posterior oropharyngeal erythema present.  Left canal impacted with cerumen; positive rhinitis bilaterally; postnasal drip present in post oropharynx  Eyes: EOM are normal. Pupils are equal, round, and reactive to light.  Neck: Neck supple.  Cardiovascular: Normal rate, regular rhythm, S1 normal, S2 normal and normal heart sounds.  No murmur heard. Pulmonary/Chest: Effort normal. No respiratory distress. He has decreased breath sounds.  Decreased breath sounds throughout  Abdominal: A hernia is present. Hernia confirmed positive in the left inguinal area.  Genitourinary: Prostate is enlarged (left lobe; larger than the right).  Musculoskeletal: Normal range of motion.  Negative Spurlings, negative Phalen's, negative Tinel's; 5/5 slight weakness, 4/5 tricep and subscapular on the right  Neurological: He is alert and oriented to person, place, and time.  Reflex Scores:      Tricep reflexes are 2+ on the right side and 2+ on the left side.      Brachioradialis reflexes are 1+ on the right side. Unable to illicit bicep DTR and trace brachioradialis  Skin: Skin  is warm and dry.  Psychiatric: He has a normal mood and affect. His behavior is normal.  Nursing note and vitals reviewed.   BP (!) 164/82   Pulse 74   Temp 98.1 F (36.7 C) (Oral)   Resp 18   Ht 5' 8.25" (1.734 m)   Wt 150 lb (68 kg)   SpO2 97%   BMI 22.64 kg/m   Results for orders placed or performed in visit on 07/25/17  POCT CBC  Result Value Ref Range   WBC 8.5 4.6 - 10.2 K/uL   Lymph, poc 1.7 0.6 - 3.4   POC LYMPH PERCENT 19.9 10 - 50 %L   MID (cbc) 0.6 0 - 0.9   POC MID % 6.5 0 - 12 %M   POC Granulocyte 6.3 2 - 6.9   Granulocyte percent 73.6 37 - 80 %G   RBC 4.88 4.69 - 6.13 M/uL   Hemoglobin 12.7 (A) 14.1 - 18.1 g/dL   HCT, POC 39.0 (A) 43.5 - 53.7 %   MCV 80.0 80 - 97 fL   MCH, POC 26.0 (A) 27 - 31.2 pg   MCHC 32.5 31.8 - 35.4 g/dL   RDW, POC 18.6 %   Platelet Count, POC 345 142 - 424 K/uL   MPV 7.4 0 - 99.8 fL  POCT urinalysis dipstick  Result Value Ref Range   Color, UA yellow yellow   Clarity, UA clear clear   Glucose, UA negative negative mg/dL   Bilirubin, UA negative negative   Ketones, POC UA negative negative mg/dL   Spec Grav, UA 1.015 1.010 - 1.025   Blood, UA negative negative   pH, UA 5.5 5.0 - 8.0   Protein Ur, POC negative negative mg/dL   Urobilinogen, UA 0.2 0.2 or 1.0 E.U./dL   Nitrite, UA Negative Negative   Leukocytes, UA Negative Negative       Assessment & Plan:  sched AWV w/ [place] Ua, lipid (hld), psa, cmp, cbc (iron def anemia), ferritin, b12 hypocalceima - vit D, ekg Flu shot, shingrix (distant h/o shingles, no prior vaccination). TSH???  1. Annual physical exam   2. Mixed hyperlipidemia   3. Vitamin B12 deficiency   4. Iron deficiency anemia, unspecified iron deficiency anemia type  5. Flu vaccine need   6. Essential hypertension   7. Radicular pain of right upper extremity   8. Hypocalcemia   9. Benign prostatic hyperplasia (BPH) with straining on urination   10. 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 Cervical radiculopathy at C6     Orders Placed This Encounter  Procedures  . DG Cervical Spine 2 or 3 views    Standing Status:   Future    Number of Occurrences:   1    Standing Expiration Date:   07/25/2018    Order Specific Question:   Reason for Exam (SYMPTOM  OR DIAGNOSIS REQUIRED)    Answer:   right upper ext radiculopathy x 4 mos wiht slight right rotator cuff weakness    Order Specific Question:   Preferred imaging location?    Answer:   External  . Flu Vaccine QUAD 36+ mos IM  . Comprehensive metabolic panel    Order Specific Question:   Has the patient fasted?    Answer:   Yes  . Lipid panel    Order Specific Question:   Has the patient fasted?    Answer:   Yes  . TSH  . PSA  . VITAMIN D 25 Hydroxy (Vit-D Deficiency, Fractures)  . Vitamin B12  . Ferritin  . Ambulatory referral to General Surgery    Referral Priority:   Routine    Referral Type:   Surgical    Referral Reason:   Specialty Services Required    Requested Specialty:   General Surgery    Number of Visits Requested:   1  . Ambulatory referral to Urology    Referral Priority:   Routine    Referral Type:   Consultation    Referral Reason:   Specialty Services Required    Requested Specialty:   Urology    Number of Visits Requested:   1  . Care order/instruction:    Scheduling Instructions:     Recheck BP  . POCT CBC  . POCT urinalysis dipstick  . EKG 12-Lead    Meds ordered this encounter  Medications  . predniSONE (DELTASONE) 10 MG tablet    Sig: 6-5-4-3-2-1 tabs po qd    Dispense:  21 tablet    Refill:  0  . meloxicam (MOBIC) 7.5 MG tablet    Sig: Take 1 tablet (7.5 mg total) daily by mouth. After the prednisone course is complete.    Dispense:  30 tablet    Refill:  1  . ALPRAZolam (XANAX) 0.5 MG tablet    Sig: Take 2 tablets (1 mg total) at bedtime as needed by mouth for sleep.    Dispense:  60 tablet    Refill:  3  . cyanocobalamin (,VITAMIN B-12,) 1000 MCG/ML injection    Sig:  Inject 1 mL every 14 days    Dispense:  10 mL    Refill:  3  . hydrochlorothiazide (HYDRODIURIL) 25 MG tablet    Sig: Take 1 tablet (25 mg total) daily by mouth.    Dispense:  90 tablet    Refill:  0    I personally performed the services described in this documentation, which was scribed in my presence. The recorded information has been reviewed and considered, and addended by me as needed.   Delman Cheadle, M.D.  Primary Care at [facility] [address] [place], [place] [address] [phone] phone [phone] fax  07/25/17 11:48 PM

## 2017-07-25 NOTE — Progress Notes (Deleted)
   Subjective:    Patient ID: Jackson Perry, male    DOB: 12-10-1945, 71 y.o.   MRN: [id number]  HPI Primary Preventative Screenings: Prostate Cancer: PSA 1.6 last yr STI screening: neg hep C 2017 Colorectal Cancer: neg HOC x 3 last yr. Colonoscopy done 05/2009 w/ 5 yr recall so overdue by >3 yrs. - pt has had 2 colonoscopies, one did have some polyps. He does not want to do any further colonscopies but is willing check HOC due to recent anemia Tobacco use/AAA/Lung/EtOH/Illicit substances:  Cardiac: EKG 02/2014 Weight/Blood sugar/Diet/Exercise: BMI Readings from Last 3 Encounters:  01/21/17 22.35 kg/m  07/25/16 24.33 kg/m  07/03/16 23.37 kg/m   No results found for: HGBA1C OTC/Vit/Supp/Herbal: Dentist/Optho: Immunizations:  Immunization History  Administered Date(s) Administered  . Influenza,inj,Quad PF,6+ Mos 07/10/2015, 05/22/2016  . Pneumococcal Conjugate-13 07/03/2016  . Pneumococcal Polysaccharide-23 02/04/2013  . Tetanus 02/04/2013    Chronic Medical Conditions: 1. HPL - Pt was on Crestor for years but stopped in 2014 due to myalgias. Lipid panel last yr gave pt a ASCVD 10 yr risk of 25% so I recommended increasing omega-3/fish oil as LDL 143, non-hdl 156. pt's only risk factors are age, male, and well controlled HTN.  2. B12 def - self-administers injections 105mcg q14d.  He can tell when it is ready for his next dose and it makes a huge difference - the next day after the shot. Sometimes he is able to go 3 weeks between but tries to stick to every 2 weeks. 3.  H/o anemia secondary to epistaxis in 2016 but last checked last mo revealed that pt was still anemic with hgb of 11.0 with ferritin on 11.  It is possible that pt never actually replaced the iron sig from the severe epistaxis last year but at this point he probably does need further evaluation to ensure he is not having occult blood loss.  He did take iron initially after the epistaxis and again after seeing dr.  Carlota Raspberry took it once a day in the morning after eating breakfast for several weeks.  - pt has had 2 colonoscopies, one did have some polyps. He does not want to do any further colonscopies but is willing check HOC due to recent anemia 4.  5. H/o HTN - was on enalapril-hctz prior but was able to go off for sev yrs.  Last mo was advised to restart on hctz 12.5 if BP > 140/90 at home. 6. 7. Insomnia - prn xanax - often takes a half but most days. Doesn't sleep much and tends to get up at 4:45   Review of Systems     Objective:   Physical Exam        Assessment & Plan:  sched AWV w/ [facility], lipid (hld), psa, cmp, cbc (iron def anemia), ferritin, b12 hypocalceima - vit D, ekg Flu shot, shingrix (distant h/o shingles, no prior vaccination). TSH???

## 2017-07-25 NOTE — Patient Instructions (Addendum)
IF you received an x-ray today, you will receive an invoice from [facility]. Please contact [facility] at [phone] with questions or concerns regarding your invoice.   IF you received labwork today, you will receive an invoice from [facility]. Please contact [facility] at [phone] with questions or concerns regarding your invoice.   Our billing staff will not be able to assist you with questions regarding bills from these companies.  You will be contacted with the lab results as soon as they are available. The fastest way to get your results is to activate your My Chart account. Instructions are located on the last page of this paperwork. If you have not heard from [place] regarding the results in 2 weeks, please contact this office.     Inguinal Hernia, Adult An inguinal hernia is when fat or the intestines push through the area where the leg meets the lower abdomen (groin) and create a rounded lump (bulge). This condition develops over time. There are three types of inguinal hernias. These types include:  Hernias that can be pushed back into the belly (are reducible).  Hernias that are not reducible (are incarcerated).  Hernias that are not reducible and lose their blood supply (are strangulated). This type of hernia requires emergency surgery.  What are the causes? This condition is caused by having a weak spot in the muscles or tissue. This weakness lets the hernia poke through. This condition can be triggered by:  Suddenly straining the muscles of the lower abdomen.  Lifting heavy objects.  Straining to have a bowel movement. Difficult bowel movements (constipation) can lead to this.  Coughing.  What increases the risk? This condition is more likely to develop in:  Men.  Pregnant women.  People who: ? Are overweight. ? Work in jobs that require long periods of standing or heavy lifting. ? Have had an inguinal hernia before. ? Smoke or have lung  disease. These factors can lead to long-lasting (chronic) coughing.  What are the signs or symptoms? Symptoms can depend on the size of the hernia. Often, a small inguinal hernia has no symptoms. Symptoms of a larger hernia include:  A lump in the groin. This is easier to see when the person is standing. It might not be visible when he or she is lying down.  Pain or burning in the groin. This occurs especially when lifting, straining, or coughing.  A dull ache or a feeling of pressure in the groin.  A lump in the scrotum in men.  Symptoms of a strangulated inguinal hernia can include:  A bulge in the groin that is very painful and tender to the touch.  A bulge that turns red or purple.  Fever, nausea, and vomiting.  The inability to have a bowel movement or to pass gas.  How is this diagnosed? This condition is diagnosed with a medical history and physical exam. Your health care provider may feel your groin area and ask you to cough. How is this treated? Treatment for this condition varies depending on the size of your hernia and whether you have symptoms. If you do not have symptoms, your health care provider may have you watch your hernia carefully and come in for follow-up visits. If your hernia is larger or if you have symptoms, your treatment will include surgery. Follow these instructions at home: Lifestyle  Drink enough fluid to keep your urine clear or pale yellow.  Eat a diet that includes a lot of fiber.  Eat plenty of fruits, vegetables, and whole grains. Talk with your health care provider if you have questions.  Avoid lifting heavy objects.  Avoid standing for long periods of time.  Do not use tobacco products, including cigarettes, chewing tobacco, or e-cigarettes. If you need help quitting, ask your health care provider.  Maintain a healthy weight. General instructions  Do not try to force the hernia back in.  Watch your hernia for any changes in color or  size. Let your health care provider know if any changes occur.  Take over-the-counter and prescription medicines only as told by your health care provider.  Keep all follow-up visits as told by your health care provider. This is important. Contact a health care provider if:  You have a fever.  You have new symptoms.  Your symptoms get worse. Get help right away if:  You have pain in the groin that suddenly gets worse.  A bulge in the groin gets bigger suddenly and does not go down.  You are a man and you have a sudden pain in the scrotum, or the size of your scrotum suddenly changes.  A bulge in the groin area becomes red or purple and is painful to the touch.  You have nausea or vomiting that does not go away.  You feel your heart beating a lot more quickly than normal.  You cannot have a bowel movement or pass gas. This information is not intended to replace advice given to you by your health care provider. Make sure you discuss any questions you have with your health care provider. Document Released: 01/11/2009 Document Revised: 01/31/2016 Document Reviewed: 07/05/2014 Elsevier Interactive Patient Education  2018 [employer].  Cervical Radiculopathy Cervical radiculopathy happens when a nerve in the neck (cervical nerve) is pinched or bruised. This condition can develop because of an injury or as part of the normal aging process. Pressure on the cervical nerves can cause pain or numbness that runs from the neck all the way down into the arm and fingers. Usually, this condition gets better with rest. Treatment may be needed if the condition does not improve. What are the causes? This condition may be caused by:  Injury.  Slipped (herniated) disk.  Muscle tightness in the neck because of overuse.  Arthritis.  Breakdown or degeneration in the bones and joints of the spine (spondylosis) due to aging.  Bone spurs that may develop near the cervical nerves.  What are the  signs or symptoms? Symptoms of this condition include:  Pain that runs from the neck to the arm and hand. The pain can be severe or irritating. It may be worse when the neck is moved.  Numbness or weakness in the affected arm and hand.  How is this diagnosed? This condition may be diagnosed based on symptoms, medical history, and a physical exam. You may also have tests, including:  X-rays.  CT scan.  MRI.  Electromyogram (EMG).  Nerve conduction tests.  How is this treated? In many cases, treatment is not needed for this condition. With rest, the condition usually gets better over time. If treatment is needed, options may include:  Wearing a soft neck collar for short periods of time.  Physical therapy to strengthen your neck muscles.  Medicines, such as NSAIDs, oral corticosteroids, or spinal injections.  Surgery. This may be needed if other treatments do not help. Various types of surgery may be done depending on the cause of your problems.  Follow these instructions at home:  Managing pain  Take over-the-counter and prescription medicines only as told by your health care provider.  If directed, apply ice to the affected area. ? Put ice in a plastic bag. ? Place a towel between your skin and the bag. ? Leave the ice on for 20 minutes, 2-3 times per day.  If ice does not help, you can try using heat. Take a warm shower or warm bath, or use a heat pack as told by your health care provider.  Try a gentle neck and shoulder massage to help relieve symptoms. Activity  Rest as needed. Follow instructions from your health care provider about any restrictions on activities.  Do stretching and strengthening exercises as told by your health care provider or physical therapist. General instructions  If you were given a soft collar, wear it as told by your health care provider.  Use a flat pillow when you sleep.  Keep all follow-up visits as told by your health care  provider. This is important. Contact a health care provider if:  Your condition does not improve with treatment. Get help right away if:  Your pain gets much worse and cannot be controlled with medicines.  You have weakness or numbness in your hand, arm, face, or leg.  You have a high fever.  You have a stiff, rigid neck.  You lose control of your bowels or your bladder (have incontinence).  You have trouble with walking, balance, or speaking. This information is not intended to replace advice given to you by your health care provider. Make sure you discuss any questions you have with your health care provider. Document Released: 05/20/2001 Document Revised: 01/31/2016 Document Reviewed: 10/19/2014 Elsevier Interactive Patient Education  [employer].

## 2017-07-27 LAB — COMPREHENSIVE METABOLIC PANEL
A/G RATIO: 1.8 (ref 1.2–2.2)
ALBUMIN: 4.5 g/dL (ref 3.5–4.8)
ALK PHOS: 68 IU/L (ref 39–117)
ALT: 18 IU/L (ref 0–44)
AST: 20 IU/L (ref 0–40)
BILIRUBIN TOTAL: 0.4 mg/dL (ref 0.0–1.2)
BUN / CREAT RATIO: 17 (ref 10–24)
BUN: 15 mg/dL (ref 8–27)
CO2: 23 mmol/L (ref 20–29)
Calcium: 9.2 mg/dL (ref 8.6–10.2)
Chloride: 102 mmol/L (ref 96–106)
Creatinine, Ser: 0.9 mg/dL (ref 0.76–1.27)
GFR calc non Af Amer: 85 mL/min/{1.73_m2} (ref >59)
GFR, EST AFRICAN AMERICAN: 98 mL/min/{1.73_m2} (ref >59)
Globulin, Total: 2.5 g/dL (ref 1.5–4.5)
Glucose: 99 mg/dL (ref 65–99)
POTASSIUM: 4.6 mmol/L (ref 3.5–5.2)
Sodium: 140 mmol/L (ref 134–144)
TOTAL PROTEIN: 7 g/dL (ref 6.0–8.5)

## 2017-07-27 LAB — VITAMIN D 25 HYDROXY (VIT D DEFICIENCY, FRACTURES): Vit D, 25-Hydroxy: 28.2 ng/mL — ABNORMAL LOW (ref 30.0–100.0)

## 2017-07-27 LAB — PSA: PROSTATE SPECIFIC AG, SERUM: 2.6 ng/mL (ref 0.0–4.0)

## 2017-07-27 LAB — LIPID PANEL
Chol/HDL Ratio: 4.8 ratio (ref 0.0–5.0)
Cholesterol, Total: 228 mg/dL — ABNORMAL HIGH (ref 100–199)
HDL: 48 mg/dL (ref >39)
LDL Calculated: 166 mg/dL — ABNORMAL HIGH (ref 0–99)
Triglycerides: 72 mg/dL (ref 0–149)
VLDL Cholesterol Cal: 14 mg/dL (ref 5–40)

## 2017-07-27 LAB — TSH: TSH: 2.06 u[IU]/mL (ref 0.450–4.500)

## 2017-07-27 LAB — FERRITIN: Ferritin: 14 ng/mL — ABNORMAL LOW (ref 30–400)

## 2017-07-27 LAB — VITAMIN B12: VITAMIN B 12: 467 pg/mL (ref 232–1245)

## 2017-08-03 ENCOUNTER — Encounter: Payer: Self-pay | Admitting: Family Medicine

## 2017-09-17 DIAGNOSIS — N401 Enlarged prostate with lower urinary tract symptoms: Secondary | ICD-10-CM | POA: Diagnosis not present

## 2017-09-17 DIAGNOSIS — R350 Frequency of micturition: Secondary | ICD-10-CM | POA: Diagnosis not present

## 2017-09-21 ENCOUNTER — Ambulatory Visit: Payer: Self-pay | Admitting: Surgery

## 2017-09-21 DIAGNOSIS — K4090 Unilateral inguinal hernia, without obstruction or gangrene, not specified as recurrent: Secondary | ICD-10-CM | POA: Diagnosis not present

## 2017-09-21 DIAGNOSIS — Z720 Tobacco use: Secondary | ICD-10-CM | POA: Diagnosis not present

## 2017-09-21 DIAGNOSIS — Z01818 Encounter for other preprocedural examination: Secondary | ICD-10-CM | POA: Diagnosis not present

## 2017-09-21 NOTE — H&P (Signed)
Jackson Perry Documented: 09/21/2017 8:42 AM Location: [facility] Patient #: [id number] DOB: May 27, 1946 Married / Language: Undefined / Race: White Male  History of Present Illness Jackson Hector MD; 09/21/2017 9:17 AM) The patient is a 72 year old male who presents with an inguinal hernia. Note for "Inguinal hernia": ` ` ` Patient sent for surgical consultation at the request of Dr. Delman Cheadle  Chief Complaint: Left inguinal hernia.  The patient is an active male. He noticed some bulging in his left groin a few years back. It's been rather subtle. However this year its become a little larger. He is felt some twinges of discomfort especially with activity moving and twisting. He is retired but his wife still works. They often travel down to [place]. Relatively active. Avid gardener. There due to remodeler house to get it sold and downsize by the springtime. Discuss with his primary care physician on annual visit. Surgical consultation recommended. Patient does not have any major urinary issues. Had a mildly elevated PSA. Saw Dr. Lovena Neighbours with [facility]. 6 month follow-up recommended. Nocturia 1 time a night only. Stable for years. No difficulty with straining. Moves his bowels every day. He can walk at least a half hour without difficulty. He still smokes cigarettes. Usually claims he has 1 for breakfast luncheon evening. That said. No cardiopulmonary issues. He had cholecystectomy done in the 1990s laparoscopically without much difficulty. No other abdominal surgeries.  (Review of systems as stated in this history (HPI) or in the review of systems. Otherwise all other 12 point ROS are negative)   Past Surgical History Malachi Bonds, CMA; 09/21/2017 8:42 AM) Cataract Surgery Bilateral. Gallbladder Surgery - Laparoscopic  Diagnostic Studies History Malachi Bonds, CMA; 09/21/2017 8:42 AM) Colonoscopy 5-10 years ago  Allergies Malachi Bonds,  CMA; 09/21/2017 8:43 AM) No Known Drug Allergies [09/21/2017]:  Medication History Malachi Bonds, CMA; 09/21/2017 8:44 AM) HydroCHLOROthiazide (25MG  Tablet, Oral) Active. Cyanocobalamin (1000MCG/ML Solution, Injection) Active. ALPRAZolam (1MG  Tablet, Oral) Active. Medications Reconciled  Social History Malachi Bonds, CMA; 09/21/2017 8:42 AM) Alcohol use Moderate alcohol use. Caffeine use Coffee. No drug use Tobacco use Current some day smoker.  Family History Malachi Bonds, CMA; 09/21/2017 8:42 AM) Arthritis Mother. Diabetes Mellitus Father. Heart Disease Father. Hypertension Father. Prostate Cancer Father.  Other Problems Malachi Bonds, CMA; 09/21/2017 8:42 AM) Arthritis Enlarged Prostate High blood pressure Hypercholesterolemia Inguinal Hernia Other disease, cancer, significant illness     Review of Systems (Chemira Jones CMA; 09/21/2017 8:42 AM) General Not Present- Appetite Loss, Chills, Fatigue, Fever, Night Sweats, Weight Gain and Weight Loss. Skin Not Present- Change in Wart/Mole, Dryness, Hives, Jaundice, New Lesions, Non-Healing Wounds, Rash and Ulcer. HEENT Not Present- Earache, Hearing Loss, Hoarseness, Nose Bleed, Oral Ulcers, Ringing in the Ears, Seasonal Allergies, Sinus Pain, Sore Throat, Visual Disturbances, Wears glasses/contact lenses and Yellow Eyes. Respiratory Not Present- Bloody sputum, Chronic Cough, Difficulty Breathing, Snoring and Wheezing. Breast Not Present- Breast Mass, Breast Pain, Nipple Discharge and Skin Changes. Cardiovascular Not Present- Chest Pain, Difficulty Breathing Lying Down, Leg Cramps, Palpitations, Rapid Heart Rate, Shortness of Breath and Swelling of Extremities. Gastrointestinal Not Present- Abdominal Pain, Bloating, Bloody Stool, Change in Bowel Habits, Chronic diarrhea, Constipation, Difficulty Swallowing, Excessive gas, Gets full quickly at meals, Hemorrhoids, Indigestion, Nausea, Rectal Pain and  Vomiting. Musculoskeletal Present- Joint Pain. Not Present- Back Pain, Joint Stiffness, Muscle Pain, Muscle Weakness and Swelling of Extremities. Neurological Not Present- Decreased Memory, Fainting, Headaches, Numbness, Seizures, Tingling, Tremor, Trouble walking and Weakness. Psychiatric Not  Present- Anxiety, Bipolar, Change in Sleep Pattern, Depression, Fearful and Frequent crying. Endocrine Not Present- Cold Intolerance, Excessive Hunger, Hair Changes, Heat Intolerance, Hot flashes and New Diabetes. Hematology Not Present- Blood Thinners, Easy Bruising, Excessive bleeding, Gland problems, HIV and Persistent Infections.  Vitals (Chemira Jones CMA; 09/21/2017 8:43 AM) 09/21/2017 8:42 AM Weight: 154.8 lb Height: 68in Body Surface Area: 1.83 m Body Mass Index: 23.54 kg/m  Pulse: 89 (Regular)  BP: 150/90 (Sitting, Left Arm, Standard)      Physical Exam Jackson Hector MD; 09/21/2017 9:14 AM)  General Mental Status-Alert. General Appearance-Not in acute distress, Not Sickly. Orientation-Oriented X3. Hydration-Well hydrated. Voice-Normal.  Integumentary Global Assessment Upon inspection and palpation of skin surfaces of the - Axillae: non-tender, no inflammation or ulceration, no drainage. and Distribution of scalp and body hair is normal. General Characteristics Temperature - normal warmth is noted.  Head and Neck Head-normocephalic, atraumatic with no lesions or palpable masses. Face Global Assessment - atraumatic, no absence of expression. Neck Global Assessment - no abnormal movements, no bruit auscultated on the right, no bruit auscultated on the left, no decreased range of motion, non-tender. Trachea-midline. Thyroid Gland Characteristics - non-tender.  Eye Eyeball - Left-Extraocular movements intact, No Nystagmus. Eyeball - Right-Extraocular movements intact, No Nystagmus. Cornea - Left-No Hazy. Cornea - Right-No  Hazy. Sclera/Conjunctiva - Left-No scleral icterus, No Discharge. Sclera/Conjunctiva - Right-No scleral icterus, No Discharge. Pupil - Left-Direct reaction to light normal. Pupil - Right-Direct reaction to light normal. Note: Wears glasses. Vision corrected  ENMT Ears Pinna - Left - no drainage observed, no generalized tenderness observed. Right - no drainage observed, no generalized tenderness observed. Nose and Sinuses External Inspection of the Nose - no destructive lesion observed. Inspection of the nares - Left - quiet respiration. Right - quiet respiration. Mouth and Throat Lips - Upper Lip - no fissures observed, no pallor noted. Lower Lip - no fissures observed, no pallor noted. Nasopharynx - no discharge present. Oral Cavity/Oropharynx - Tongue - no dryness observed. Oral Mucosa - no cyanosis observed. Hypopharynx - no evidence of airway distress observed.  Chest and Lung Exam Inspection Movements - Normal and Symmetrical. Accessory muscles - No use of accessory muscles in breathing. Palpation Palpation of the chest reveals - Non-tender. Auscultation Breath sounds - Normal and Clear.  Cardiovascular Auscultation Rhythm - Regular. Murmurs & Other Heart Sounds - Auscultation of the heart reveals - No Murmurs and No Systolic Clicks.  Abdomen Inspection Inspection of the abdomen reveals - No Visible peristalsis and No Abnormal pulsations. Umbilicus - No Bleeding, No Urine drainage. Palpation/Percussion Palpation and Percussion of the abdomen reveal - Soft, Non Tender, No Rebound tenderness, No Rigidity (guarding) and No Cutaneous hyperesthesia. Note: Abdomen soft. Nontender. Not distended. No umbilical or incisional hernias. No guarding.  Male Genitourinary Sexual Maturity Tanner 5 - Adult hair pattern and Adult penile size and shape. Note: Left groin bulging with reducible inguinal hernia. Impulse in right groin with cough suspicious for indirect inguinal  hernia as well. Normal external genitalia. Epididymi, testes, and spermatic cords normal without any masses.  Peripheral Vascular Upper Extremity Inspection - Left - No Cyanotic nailbeds, Not Ischemic. Right - No Cyanotic nailbeds, Not Ischemic.  Neurologic Neurologic evaluation reveals -normal attention span and ability to concentrate, able to name objects and repeat phrases. Appropriate fund of knowledge , normal sensation and normal coordination. Mental Status Affect - not angry, not paranoid. Cranial Nerves-Normal Bilaterally. Gait-Normal.  Neuropsychiatric Mental status exam performed with findings of-able to articulate  well with normal speech/language, rate, volume and coherence, thought content normal with ability to perform basic computations and apply abstract reasoning and no evidence of hallucinations, delusions, obsessions or homicidal/suicidal ideation.  Musculoskeletal Global Assessment Spine, Ribs and Pelvis - no instability, subluxation or laxity. Right Upper Extremity - no instability, subluxation or laxity.  Lymphatic Head & Neck  General Head & Neck Lymphatics: Bilateral - Description - No Localized lymphadenopathy. Axillary  General Axillary Region: Bilateral - Description - No Localized lymphadenopathy. Femoral & Inguinal  Generalized Femoral & Inguinal Lymphatics: Left - Description - No Localized lymphadenopathy. Right - Description - No Localized lymphadenopathy.    Assessment & Plan Jackson Hector MD; 09/21/2017 9:16 AM)  LEFT INGUINAL HERNIA (K40.90) Impression: Definite left and probable right of hernias and active smoking male.  I think he would benefit from surgical repair since its become larger and more symptomatic. He is trying to get his house ready to be sold by the spraying with remodeling and moderate lifting and activity. He is also an avid gardener. He is debating about delaying until the fall but would rather try and get it done  now so he has time to recover and do the extra work in the spring time. Given the fact its become more symptomatic and he has a lot of heavy physical activity requirements, I recommend he consider trying to get surgery done now. Hopefully we can fit him in in a reasonable time. He is interested in proceeding.  Recommend he completely quit smoking. He is down to just a few cigarettes a day. Otherwise very active.   PREOP - ING HERNIA - ENCOUNTER FOR PREOPERATIVE EXAMINATION FOR GENERAL SURGICAL PROCEDURE (Z01.818)  Current Plans You are being scheduled for surgery- Our schedulers will call you.  You should hear from our office's scheduling department within 5 working days about the location, date, and time of surgery. We try to make accommodations for patient's preferences in scheduling surgery, but sometimes the OR schedule or the surgeon's schedule prevents [place] from making those accommodations.  If you have not heard from our office [phone]) in 5 working days, call the office and ask for your surgeon's nurse.  If you have other questions about your diagnosis, plan, or surgery, call the office and ask for your surgeon's nurse.  Written instructions provided The anatomy & physiology of the abdominal wall and pelvic floor was discussed. The pathophysiology of hernias in the inguinal and pelvic region was discussed. Natural history risks such as progressive enlargement, pain, incarceration, and strangulation was discussed. Contributors to complications such as smoking, obesity, diabetes, prior surgery, etc were discussed.  I feel the risks of no intervention will lead to serious problems that outweigh the operative risks; therefore, I recommended surgery to reduce and repair the hernia. I explained laparoscopic techniques with possible need for an open approach. I noted usual use of mesh to patch and/or buttress hernia repair  Risks such as bleeding, infection, abscess, need for  further treatment, heart attack, death, and other risks were discussed. I noted a good likelihood this will help address the problem. Goals of post-operative recovery were discussed as well. Possibility that this will not correct all symptoms was explained. I stressed the importance of low-impact activity, aggressive pain control, avoiding constipation, & not pushing through pain to minimize risk of post-operative chronic pain or injury. Possibility of reherniation was discussed. We will work to minimize complications.  An educational handout further explaining the pathology & treatment options was given  as well. Questions were answered. The patient expresses understanding & wishes to proceed with surgery.  Pt Education - Pamphlet Given - Laparoscopic Hernia Repair: discussed with patient and provided information. Pt Education - [facility] Pain Control ([place]) Pt Education - [facility] Hernia Post-Op HCI ([place]): discussed with patient and provided information.  TOBACCO ABUSE (Z72.0)  Current Plans Pt Education - [facility] STOP SMOKING!  Jackson Perry, M.D., F.A.C.S. Gastrointestinal and Minimally Invasive Surgery [facility] [address] [place], [place] [id number] [phone] Main / Paging

## 2017-10-14 ENCOUNTER — Other Ambulatory Visit: Payer: Self-pay | Admitting: Family Medicine

## 2017-10-22 DIAGNOSIS — K4020 Bilateral inguinal hernia, without obstruction or gangrene, not specified as recurrent: Secondary | ICD-10-CM | POA: Diagnosis not present

## 2017-10-22 DIAGNOSIS — D176 Benign lipomatous neoplasm of spermatic cord: Secondary | ICD-10-CM | POA: Diagnosis not present

## 2017-10-22 DIAGNOSIS — K4120 Bilateral femoral hernia, without obstruction or gangrene, not specified as recurrent: Secondary | ICD-10-CM | POA: Diagnosis not present

## 2017-10-26 IMAGING — DX DG HAND COMPLETE 3+V*R*
3 series · 3 of 3 positions shown · non-contrast
Comparison: None.

CLINICAL DATA: Swelling and pain over the first right MCP joint and
first metacarpal.

EXAM:
RIGHT HAND - COMPLETE 3+ VIEW

[hand pa]
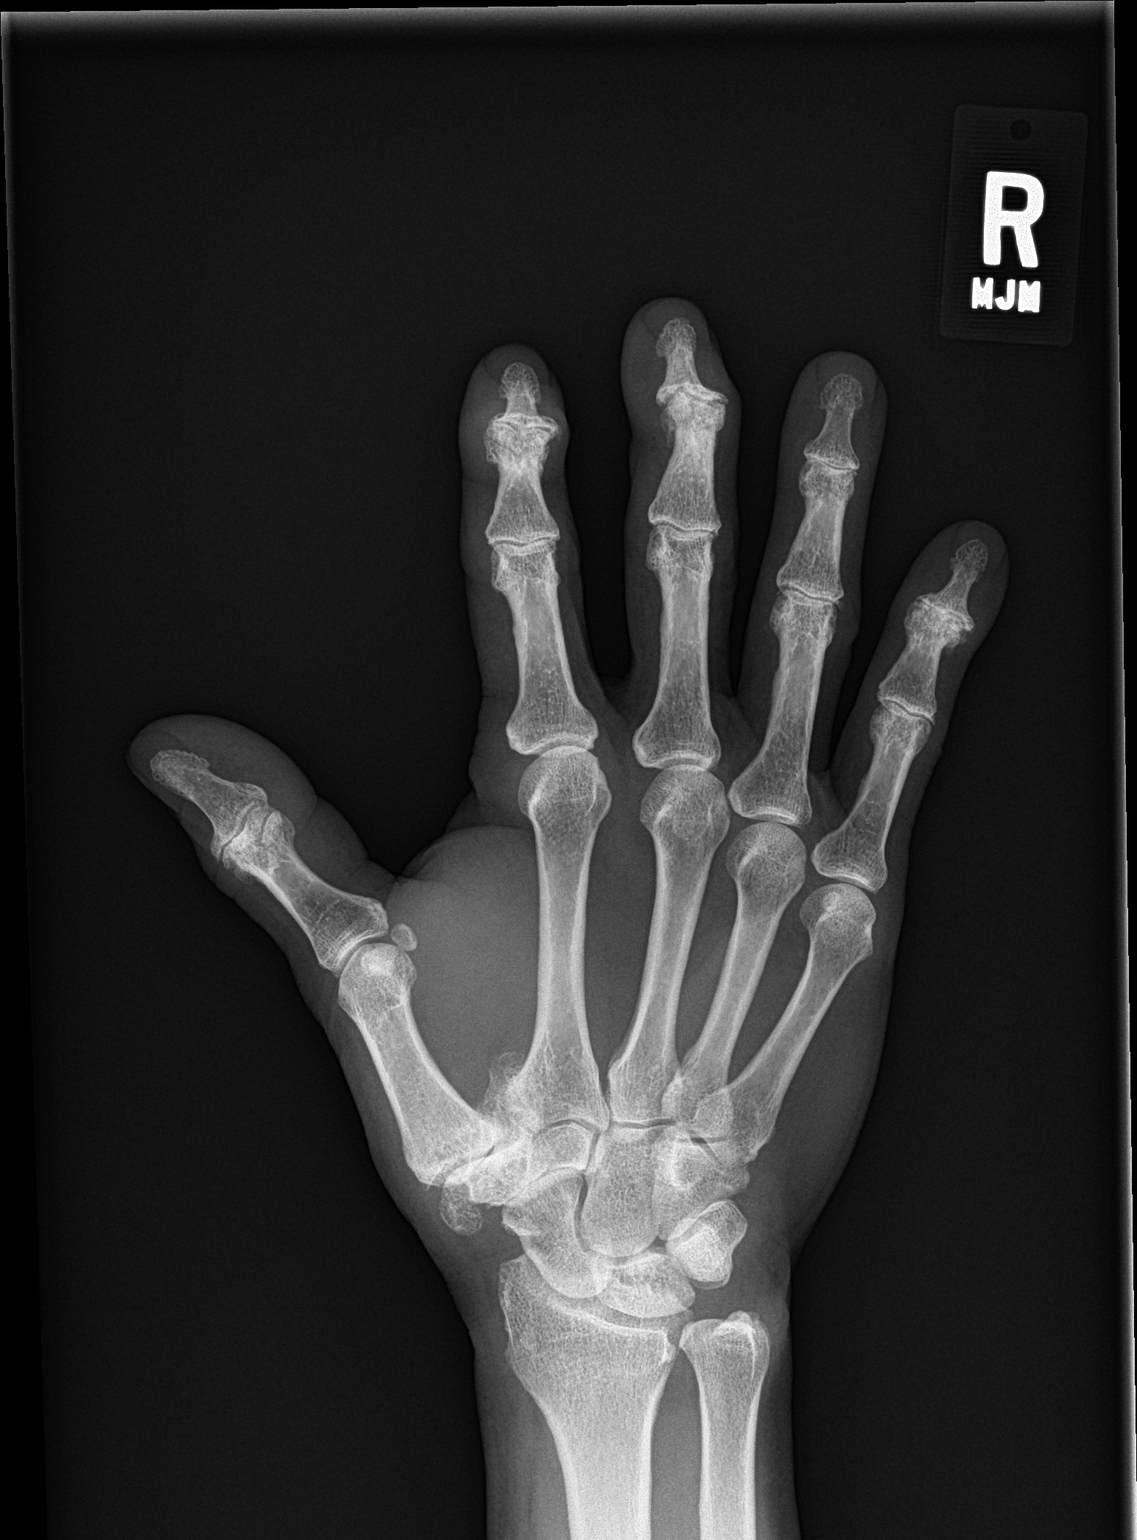

[hand obl]
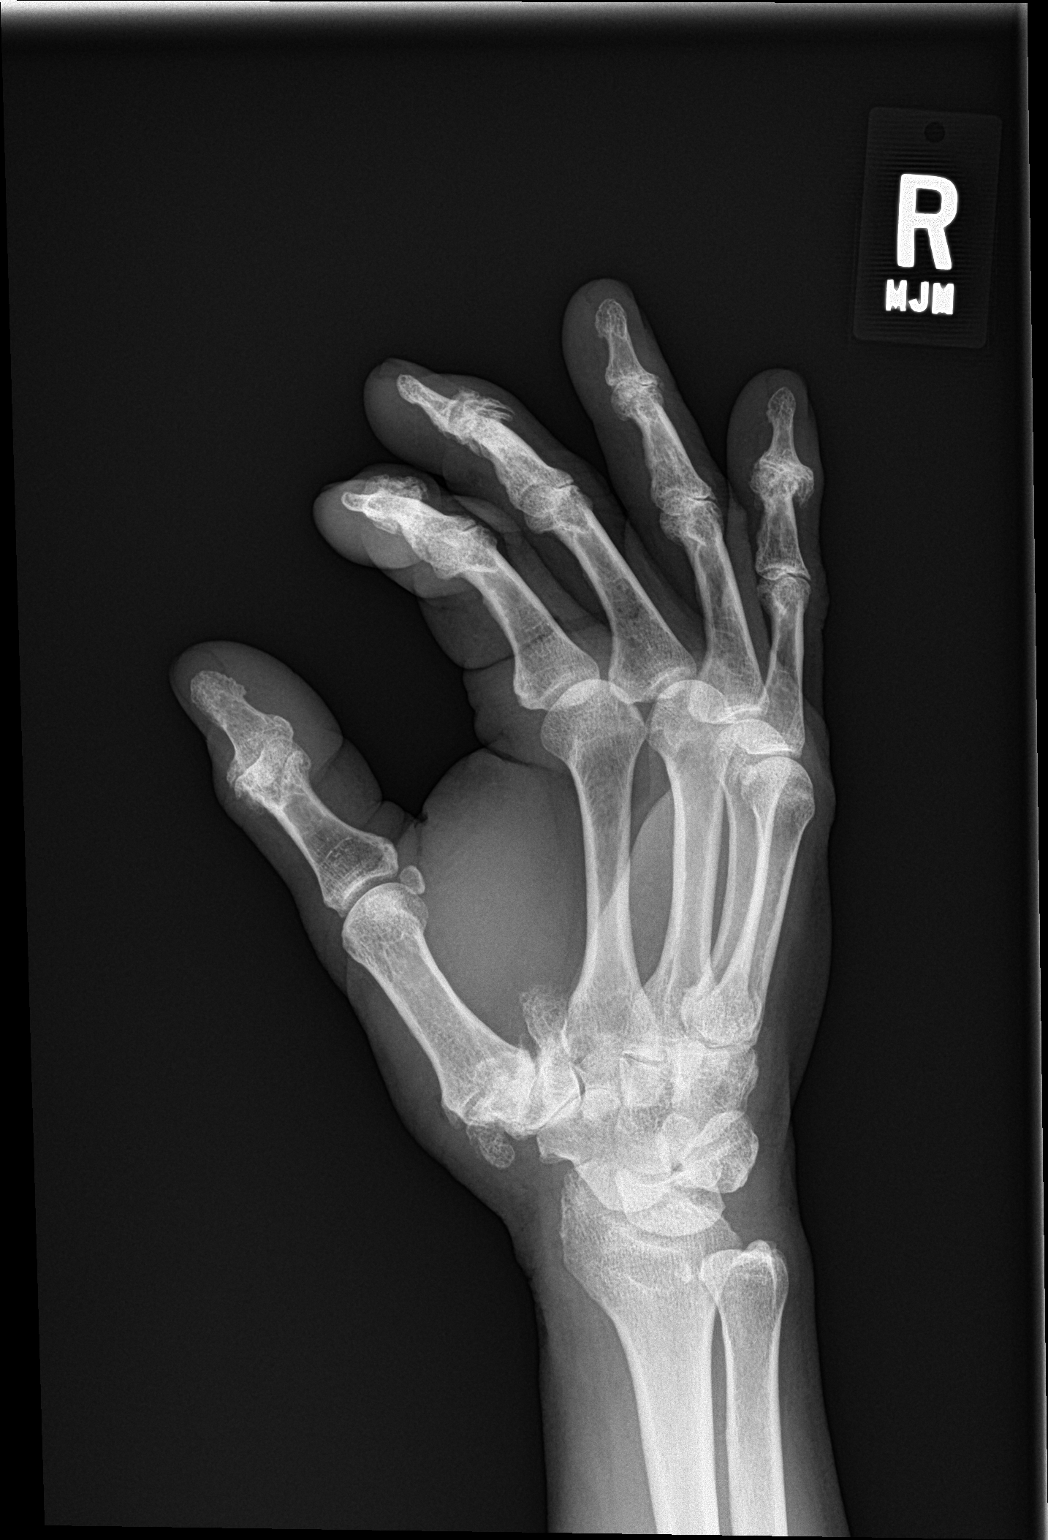

[hand lat]
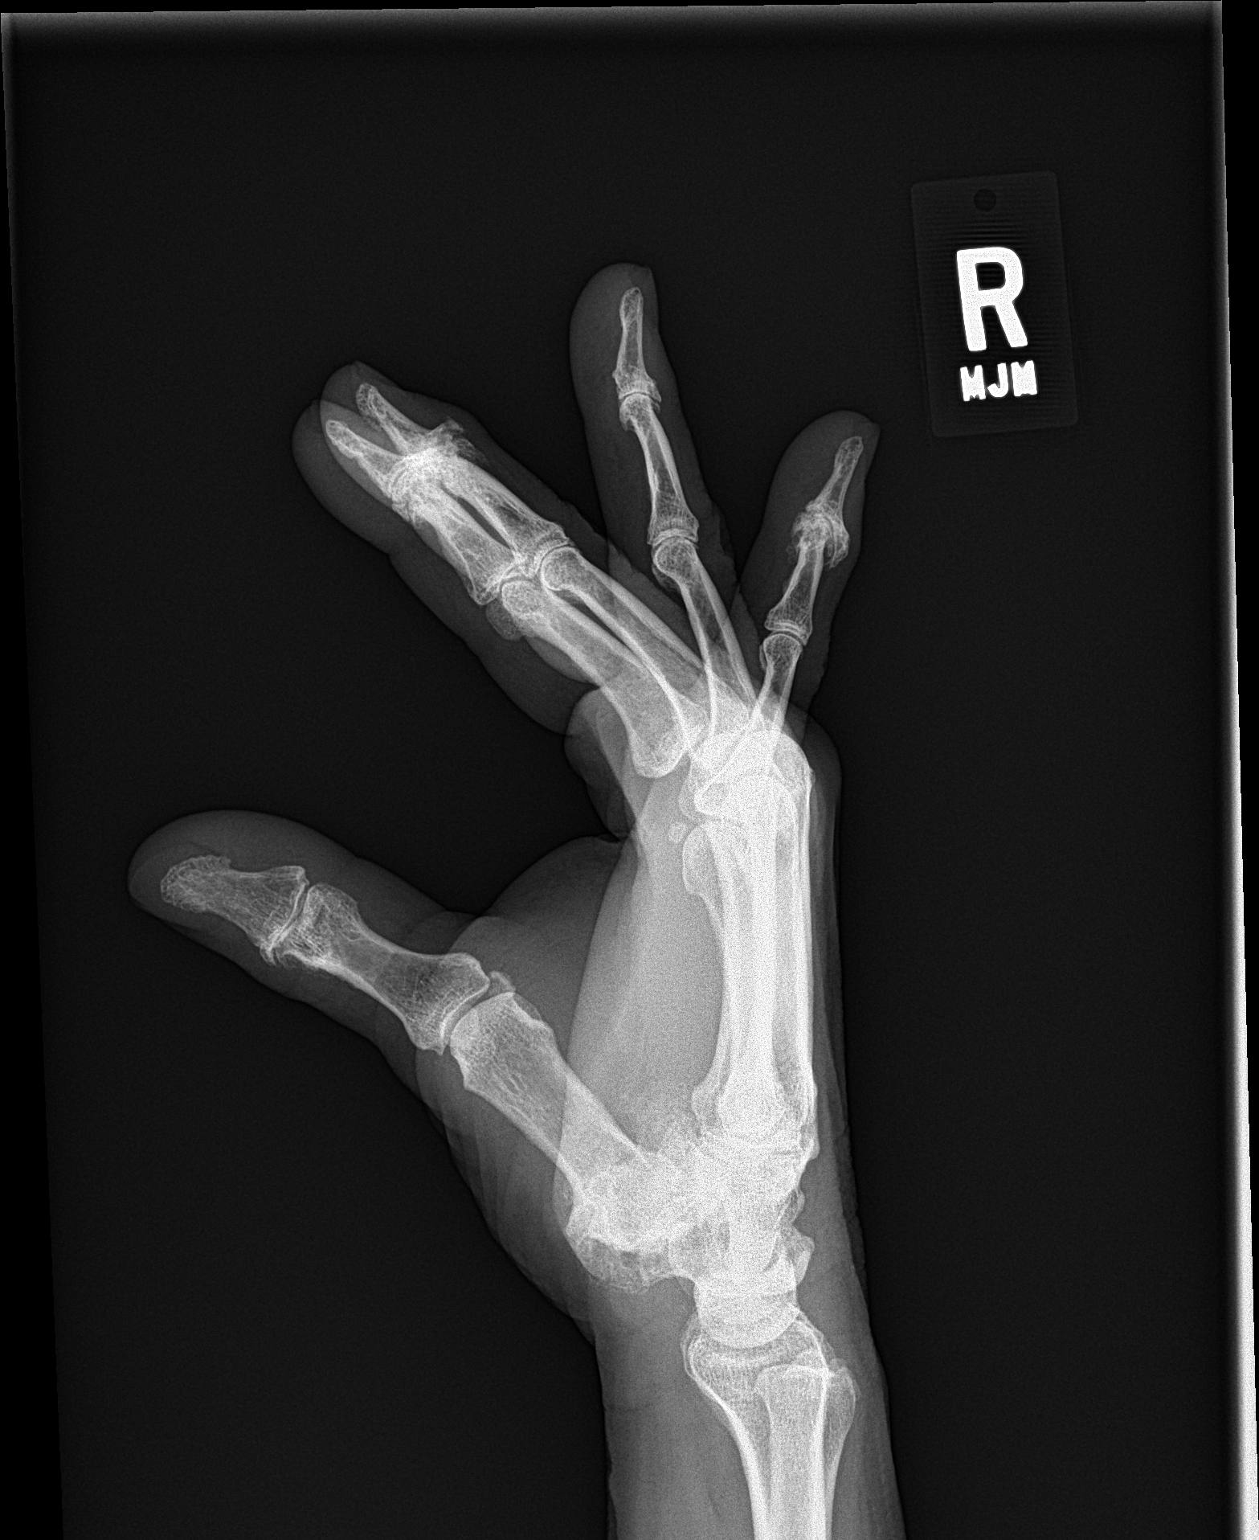

[3 of 3 positions shown; findings below may reference images not displayed]

FINDINGS: No acute fracture or dislocation is identified. Advanced
osteoarthrosis is noted involving the thumb IP joint and the DIP
joints of each finger with marked osteophytosis and joint space
narrowing. Prominent degenerative change is also seen at the first
CMC joint. No focal osseous lesion or soft tissue abnormality is
seen.
IMPRESSION: 1. No evidence of acute osseous abnormality.
2. Advanced osteoarthrosis.

## 2018-02-11 ENCOUNTER — Other Ambulatory Visit: Payer: Self-pay | Admitting: Family Medicine

## 2018-02-13 ENCOUNTER — Other Ambulatory Visit: Payer: Self-pay | Admitting: Family Medicine

## 2018-02-15 ENCOUNTER — Encounter: Payer: Self-pay | Admitting: Family Medicine

## 2018-02-15 NOTE — Telephone Encounter (Signed)
Rx refill request Alparazolam 0.5 mg  LOV 07/25/2017  Dr Brigitte Pulse  Last filled 07/25/2017  Dr Brigitte Pulse 60 tabs  2 tabs hs prn 3 refills   Pharmacy on file

## 2018-02-16 ENCOUNTER — Other Ambulatory Visit: Payer: Self-pay

## 2018-02-16 ENCOUNTER — Ambulatory Visit: Payer: Medicare Other | Admitting: Family Medicine

## 2018-02-16 ENCOUNTER — Encounter: Payer: Self-pay | Admitting: Family Medicine

## 2018-02-16 VITALS — BP 126/74 | HR 64 | Temp 97.6°F | Ht 68.0 in | Wt 148.4 lb

## 2018-02-16 DIAGNOSIS — E538 Deficiency of other specified B group vitamins: Secondary | ICD-10-CM

## 2018-02-16 DIAGNOSIS — F5101 Primary insomnia: Secondary | ICD-10-CM

## 2018-02-16 DIAGNOSIS — I10 Essential (primary) hypertension: Secondary | ICD-10-CM | POA: Diagnosis not present

## 2018-02-16 MED ORDER — HYDROCHLOROTHIAZIDE 25 MG PO TABS: 25.0000 mg | ORAL_TABLET | Freq: Every day | ORAL | 3 refills | Status: DC

## 2018-02-16 MED ORDER — ALPRAZOLAM 0.5 MG PO TABS: 1.0000 mg | ORAL_TABLET | Freq: Every evening | ORAL | 5 refills | Status: DC | PRN

## 2018-02-16 MED ORDER — CYANOCOBALAMIN 1000 MCG/ML IJ SOLN: INTRAMUSCULAR | 3 refills | Status: DC

## 2018-02-16 NOTE — Patient Instructions (Addendum)
IF you received an x-ray today, you will receive an invoice from [facility]. Please contact [facility] at [phone] with questions or concerns regarding your invoice.   IF you received labwork today, you will receive an invoice from [facility]. Please contact [facility] at [phone] with questions or concerns regarding your invoice.   Our billing staff will not be able to assist you with questions regarding bills from these companies.  You will be contacted with the lab results as soon as they are available. The fastest way to get your results is to activate your My Chart account. Instructions are located on the last page of this paperwork. If you have not heard from [place] regarding the results in 2 weeks, please contact this office.      Adjustment Disorder, Adult Adjustment disorder is a group of symptoms that can develop after a stressful life event, such as the loss of a job or serious physical illness. The symptoms can affect how you feel, think, and act. They may interfere with your relationships. Adjustment disorder increases your risk of suicide and substance abuse. If this disorder is not managed early, it can develop into a more serious condition, such as major depressive disorder or post-traumatic stress disorder. What are the causes? This condition happens when you have trouble recovering from or coping with a stressful life event. What increases the risk? You are more likely to develop this condition if:  You have had depression or anxiety.  You are being treated for a long-term (chronic) illness.  You are being treated for an illness that cannot be cured (terminal illness).  You have a family history of mental illness.  What are the signs or symptoms? Symptoms of this condition include:  Extreme trouble doing daily tasks, such as going to work.  Sadness, depression, or crying spells.  Worrying a lot.  Loss of enjoyment.  Change in  appetite or weight.  Feelings of loss or hopelessness.  Thoughts of suicide.  Anxiety, worry, or nervousness.  Trouble sleeping.  Avoiding family and friends.  Fighting or vandalism.  Complaining of feeling sick without being ill.  Feeling dazed or disconnected.  Nightmares.  Trouble sleeping.  Irritability.  Reckless driving.  Poor work Systems analyst.  Ignoring bills.  Symptoms of this condition start within three months of the stressful event. They do not last more than six months, unless the stressful circumstances last longer. Normal grieving after the death of a loved one is not a symptom of this condition. How is this diagnosed? To diagnose this condition, your health care provider will ask about what has happened in your life and how it has affected you. He or she may also ask about your medical history and your use of medicines, alcohol, and other substances. Your health care provider may do a physical exam and order lab tests or other studies. You may be referred to a mental health specialist. How is this treated? Treatment options for this condition include:  Counseling or talk therapy. Talk therapy is usually provided by mental health specialists.  Medicines. Certain medicines may help with depression, anxiety, and sleep.  Support groups. These offer emotional support, advice, and guidance. They are made up of people who have had similar experiences.  Observation and time. This is sometimes called "watchful waiting." In this treatment, health care providers monitor your health and behavior without other treatment. Adjustment disorder sometimes gets better on its own with time.  Follow these instructions at home:  Take over-the-counter and prescription medicines only as told by your health care provider.  Keep all follow-up visits as told by your health care provider. This is important. Contact a health care provider if:  Your symptoms do not improve in six  months.  Your symptoms get worse. Get help right away if:  You have serious thoughts about hurting yourself or someone else. If you ever feel like you may hurt yourself or others, or have thoughts about taking your own life, get help right away. You can go to your nearest emergency department or call:  Your local emergency services (911 in the [place]  A suicide crisis helpline, such as the [facility] at 509-335-6593. This is open 24 hours a day.  Summary  Adjustment disorder is a group of symptoms that can develop after a stressful life event, such as the loss of a job or serious physical illness. The symptoms can affect how you feel, think, and act. They may interfere with your relationships.  Symptoms of this condition start within three months of the stressful event. They do not last more than six months, unless the stressful circumstances last longer.  Treatment may include talk therapy, medicines, participation in a support group, or observation to see if symptoms improve.  Contact your health care provider if your symptoms get worse or do not improve in six months.  If you ever feel like you may hurt yourself or others, or have thoughts about taking your own life, get help right away. This information is not intended to replace advice given to you by your health care provider. Make sure you discuss any questions you have with your health care provider. Document Released: 04/29/2006 Document Revised: 10/24/2016 Document Reviewed: 10/24/2016 Elsevier Interactive Patient Education  2018 [employer].  Sinusitis, Adult Sinusitis is soreness and inflammation of your sinuses. Sinuses are hollow spaces in the bones around your face. Your sinuses are located:  Around your eyes.  In the middle of your forehead.  Behind your nose.  In your cheekbones.  Your sinuses and nasal passages are lined with a stringy fluid (mucus). Mucus normally drains out of  your sinuses. When your nasal tissues become inflamed or swollen, the mucus can become trapped or blocked so air cannot flow through your sinuses. This allows bacteria, viruses, and funguses to grow, which leads to infection. Sinusitis can develop quickly and last for 7?10 days (acute) or for more than 12 weeks (chronic). Sinusitis often develops after a cold. What are the causes? This condition is caused by anything that creates swelling in the sinuses or stops mucus from draining, including:  Allergies.  Asthma.  Bacterial or viral infection.  Abnormally shaped bones between the nasal passages.  Nasal growths that contain mucus (nasal polyps).  Narrow sinus openings.  Pollutants, such as chemicals or irritants in the air.  A foreign object stuck in the nose.  A fungal infection. This is rare.  What increases the risk? The following factors may make you more likely to develop this condition:  Having allergies or asthma.  Having had a recent cold or respiratory tract infection.  Having structural deformities or blockages in your nose or sinuses.  Having a weak immune system.  Doing a lot of swimming or diving.  Overusing nasal sprays.  Smoking.  What are the signs or symptoms? The main symptoms of this condition are pain and a feeling of pressure around the affected sinuses. Other symptoms include:  Upper toothache.  Earache.  Headache.  Bad breath.  Decreased sense of smell and taste.  A cough that may get worse at night.  Fatigue.  Fever.  Thick drainage from your nose. The drainage is often green and it may contain pus (purulent).  Stuffy nose or congestion.  Postnasal drip. This is when extra mucus collects in the throat or back of the nose.  Swelling and warmth over the affected sinuses.  Sore throat.  Sensitivity to light.  How is this diagnosed? This condition is diagnosed based on symptoms, a medical history, and a physical exam. To find  out if your condition is acute or chronic, your health care provider may:  Look in your nose for signs of nasal polyps.  Tap over the affected sinus to check for signs of infection.  View the inside of your sinuses using an imaging device that has a light attached (endoscope).  If your health care provider suspects that you have chronic sinusitis, you may also:  Be tested for allergies.  Have a sample of mucus taken from your nose (nasal culture) and checked for bacteria.  Have a mucus sample examined to see if your sinusitis is related to an allergy.  If your sinusitis does not respond to treatment and it lasts longer than 8 weeks, you may have an MRI or CT scan to check your sinuses. These scans also help to determine how severe your infection is. In rare cases, a bone biopsy may be done to rule out more serious types of fungal sinus disease. How is this treated? Treatment for sinusitis depends on the cause and whether your condition is chronic or acute. If a virus is causing your sinusitis, your symptoms will go away on their own within 10 days. You may be given medicines to relieve your symptoms, including:  Topical nasal decongestants. They shrink swollen nasal passages and let mucus drain from your sinuses.  Antihistamines. These drugs block inflammation that is triggered by allergies. This can help to ease swelling in your nose and sinuses.  Topical nasal corticosteroids. These are nasal sprays that ease inflammation and swelling in your nose and sinuses.  Nasal saline washes. These rinses can help to get rid of thick mucus in your nose.  If your condition is caused by bacteria, you will be given an antibiotic medicine. If your condition is caused by a fungus, you will be given an antifungal medicine. Surgery may be needed to correct underlying conditions, such as narrow nasal passages. Surgery may also be needed to remove polyps. Follow these instructions at  home: Medicines  Take, use, or apply over-the-counter and prescription medicines only as told by your health care provider. These may include nasal sprays.  If you were prescribed an antibiotic medicine, take it as told by your health care provider. Do not stop taking the antibiotic even if you start to feel better. Hydrate and Humidify  Drink enough water to keep your urine clear or pale yellow. Staying hydrated will help to thin your mucus.  Use a cool mist humidifier to keep the humidity level in your home above 50%.  Inhale steam for 10-15 minutes, 3-4 times a day or as told by your health care provider. You can do this in the bathroom while a hot shower is running.  Limit your exposure to cool or dry air. Rest  Rest as much as possible.  Sleep with your head raised (elevated).  Make sure to get enough sleep each night. General instructions  Apply a warm,  moist washcloth to your face 3-4 times a day or as told by your health care provider. This will help with discomfort.  Wash your hands often with soap and water to reduce your exposure to viruses and other germs. If soap and water are not available, use hand sanitizer.  Do not smoke. Avoid being around people who are smoking (secondhand smoke).  Keep all follow-up visits as told by your health care provider. This is important. Contact a health care provider if:  You have a fever.  Your symptoms get worse.  Your symptoms do not improve within 10 days. Get help right away if:  You have a severe headache.  You have persistent vomiting.  You have pain or swelling around your face or eyes.  You have vision problems.  You develop confusion.  Your neck is stiff.  You have trouble breathing. This information is not intended to replace advice given to you by your health care provider. Make sure you discuss any questions you have with your health care provider. Document Released: 08/25/2005 Document Revised:  04/20/2016 Document Reviewed: 06/20/2015 Elsevier Interactive Patient Education  [employer].

## 2018-02-16 NOTE — Progress Notes (Signed)
Subjective:  By signing my name below, I, Essence Howell, attest that this documentation has been prepared under the direction and in the presence of Delman Cheadle, MD Electronically Signed: Ladene Artist, ED Scribe 02/16/2018 at 12:30 PM.   Patient ID: Jackson Perry, male    DOB: 1945/09/30, 72 y.o.   MRN: [id number]  Chief Complaint  Patient presents with  . Medication Refill    Xanax,Vitamin b12,hydrochlorothiazide    HPI Jackson Perry is a 72 y.o. male who presents to Primary Care at [facility] for med refill.  HTN Pt checks his BP at home. Denies any elevated readings. Walks for exercise and has been eating a healthy, balanced diet.  Vit B12 Pt does injections every 10 days-3 wks. States he "listens to his body".  Vit D He is not currently taking Vit D supplementation. States he does not drink milk but eats salmon, broccoli, greens. Denies recent injuries.  Anxiety In the process of buying/selling house after 36 yrs and has noticed increased anxiety with his wife and difficulty sleeping. Typically takes 1 Xanax tab prn but has been taking 2 recently with improvement.  Past Medical History:  Diagnosis Date  . Anemia   . Arthritis   . Depression    Current Outpatient Medications on File Prior to Visit  Medication Sig Dispense Refill  . ALPRAZolam (XANAX) 0.5 MG tablet Take 2 tablets (1 mg total) at bedtime as needed by mouth for sleep. 60 tablet 3  . aspirin 81 MG tablet Take 81 mg by mouth daily.    . cyanocobalamin (,VITAMIN B-12,) 1000 MCG/ML injection Inject 1 mL every 14 days 10 mL 3  . hydrochlorothiazide (HYDRODIURIL) 25 MG tablet take 1 tablet by mouth daily 90 tablet 0   No current facility-administered medications on file prior to visit.    Allergies  Allergen Reactions  . Bupropion Hcl   . Shrimp [Shellfish Allergy]    Past Surgical History:  Procedure Laterality Date  . CHOLECYSTECTOMY     Family History  Problem Relation Age of Onset  . Heart disease  Father   . Diabetes Father   . Cancer Father    Social History   Socioeconomic History  . Marital status: Married    Spouse name: Not on file  . Number of children: Not on file  . Years of education: Not on file  . Highest education level: Not on file  Occupational History  . Not on file  Social Needs  . Financial resource strain: Not on file  . Food insecurity:    Worry: Not on file    Inability: Not on file  . Transportation needs:    Medical: Not on file    Non-medical: Not on file  Tobacco Use  . Smoking status: Current Some Day Smoker    Types: Cigarettes    Last attempt to quit: 11/05/2003    Years since quitting: 14.6  . Smokeless tobacco: Never Used  Substance and Sexual Activity  . Alcohol use: Not on file  . Drug use: Not on file  . Sexual activity: Not on file  Lifestyle  . Physical activity:    Days per week: Not on file    Minutes per session: Not on file  . Stress: Not on file  Relationships  . Social connections:    Talks on phone: Not on file    Gets together: Not on file    Attends religious service: Not on file    Active  member of club or organization: Not on file    Attends meetings of clubs or organizations: Not on file    Relationship status: Not on file  Other Topics Concern  . Not on file  Social History Narrative  . Not on file   Depression screen [facility] 2/9 02/16/2018 07/25/2017 01/21/2017 05/22/2016 07/10/2015  Decreased Interest 0 0 0 0 0  Down, Depressed, Hopeless 0 0 0 0 0  PHQ - 2 Score 0 0 0 0 0    Review of Systems  Musculoskeletal: Negative for arthralgias and myalgias.  Psychiatric/Behavioral: Positive for sleep disturbance (improved). The patient is nervous/anxious.       Objective:   Physical Exam  Constitutional: He is oriented to person, place, and time. He appears well-developed and well-nourished. No distress.  HENT:  Head: Normocephalic and atraumatic.  Right Ear: Tympanic membrane is injected and retracted.   Mouth/Throat: Posterior oropharyngeal erythema present.  L TM: cerumen  Eyes: Conjunctivae and EOM are normal.  Neck: Neck supple. No tracheal deviation present.  Cardiovascular: Normal rate.  Pulmonary/Chest: Effort normal. No respiratory distress.  Musculoskeletal: Normal range of motion.  Neurological: He is alert and oriented to person, place, and time.  Skin: Skin is warm and dry.  Psychiatric: He has a normal mood and affect. His behavior is normal.  Nursing note and vitals reviewed.  BP 126/74 (BP Location: Left Arm, Patient Position: Sitting, Cuff Size: Normal)   Pulse 64   Temp 97.6 F (36.4 C) (Oral)   Ht 5\' 8"  (1.727 m)   Wt 148 lb 6.4 oz (67.3 kg)   SpO2 99%   BMI 22.56 kg/m     Assessment & Plan:   1. Essential hypertension   2. Vitamin B12 deficiency   3. Primary insomnia     Meds ordered this encounter  Medications  . hydrochlorothiazide (HYDRODIURIL) 25 MG tablet    Sig: Take 1 tablet (25 mg total) by mouth daily.    Dispense:  90 tablet    Refill:  3  . cyanocobalamin (,VITAMIN B-12,) 1000 MCG/ML injection    Sig: Inject 1 mL every 14 days    Dispense:  10 mL    Refill:  3  . ALPRAZolam (XANAX) 0.5 MG tablet    Sig: Take 2 tablets (1 mg total) by mouth at bedtime as needed for sleep.    Dispense:  60 tablet    Refill:  5   I personally performed the services described in this documentation, which was scribed in my presence. The recorded information has been reviewed and considered, and addended by me as needed.   Delman Cheadle, M.D.  Primary Care at [facility] [address] [place], [place] [address] [phone] phone [phone] fax  06/17/18 4:39 AM

## 2018-04-06 DIAGNOSIS — H5203 Hypermetropia, bilateral: Secondary | ICD-10-CM | POA: Diagnosis not present

## 2018-06-25 ENCOUNTER — Telehealth: Payer: Self-pay | Admitting: Family Medicine

## 2018-06-25 NOTE — Telephone Encounter (Signed)
Called and spoke with pt regarding her appt on 07/23/18. Due to the template changes, we need to change their schedule just a bit.   No further action needed.   Thank you!

## 2018-07-26 ENCOUNTER — Other Ambulatory Visit: Payer: Self-pay

## 2018-07-26 ENCOUNTER — Ambulatory Visit (INDEPENDENT_AMBULATORY_CARE_PROVIDER_SITE_OTHER): Payer: Medicare Other | Admitting: Family Medicine

## 2018-07-26 ENCOUNTER — Encounter: Payer: Self-pay | Admitting: Family Medicine

## 2018-07-26 VITALS — BP 132/72 | HR 72 | Temp 98.0°F | Resp 16 | Ht 68.11 in | Wt 161.0 lb

## 2018-07-26 DIAGNOSIS — Z0000 Encounter for general adult medical examination without abnormal findings: Secondary | ICD-10-CM

## 2018-07-26 DIAGNOSIS — E782 Mixed hyperlipidemia: Secondary | ICD-10-CM | POA: Diagnosis not present

## 2018-07-26 DIAGNOSIS — Z23 Encounter for immunization: Secondary | ICD-10-CM

## 2018-07-26 DIAGNOSIS — Z125 Encounter for screening for malignant neoplasm of prostate: Secondary | ICD-10-CM | POA: Diagnosis not present

## 2018-07-26 DIAGNOSIS — E538 Deficiency of other specified B group vitamins: Secondary | ICD-10-CM

## 2018-07-26 DIAGNOSIS — D509 Iron deficiency anemia, unspecified: Secondary | ICD-10-CM | POA: Diagnosis not present

## 2018-07-26 DIAGNOSIS — R972 Elevated prostate specific antigen [PSA]: Secondary | ICD-10-CM

## 2018-07-26 DIAGNOSIS — I10 Essential (primary) hypertension: Secondary | ICD-10-CM

## 2018-07-26 DIAGNOSIS — E559 Vitamin D deficiency, unspecified: Secondary | ICD-10-CM

## 2018-07-26 LAB — POCT URINALYSIS DIP (MANUAL ENTRY)
BILIRUBIN UA: NEGATIVE mg/dL
Bilirubin, UA: NEGATIVE
Blood, UA: NEGATIVE
Glucose, UA: NEGATIVE mg/dL
Leukocytes, UA: NEGATIVE
Nitrite, UA: NEGATIVE
PH UA: 6.5 (ref 5.0–8.0)
PROTEIN UA: NEGATIVE mg/dL
SPEC GRAV UA: 1.015 (ref 1.010–1.025)
UROBILINOGEN UA: 0.2 U/dL

## 2018-07-26 MED ORDER — BETAMETHASONE DIPROPIONATE AUG 0.05 % EX CREA: TOPICAL_CREAM | Freq: Two times a day (BID) | CUTANEOUS | 0 refills | Status: DC

## 2018-07-26 NOTE — Progress Notes (Signed)
Subjective:    Patient ID: Jackson Perry; male   DOB: 09-Feb-1946; 72 y.o.   MRN: [id number]  Chief Complaint  Patient presents with  . Annual Exam    HPI Primary Preventative Screenings: Prostate Cancer: was seen by urology who told him to RTC in July for PSA - but they only had Augest avail and then cancelled and sched him an appt in late Oct Lab Results  Component Value Date   PSA1 2.6 07/25/2017  STI screening:  Colorectal Cancer: will be due to [facility] next year - 05/2019 Tobacco use/AAA/Lung Cancer/EtOH/Illicit substances:  Cardiac: EKG 07/25/17 Weight/Blood sugar/Diet/Exercise: gained weight and doesn't know why as no change in diet and walking BMI Readings from Last 3 Encounters:  07/26/18 24.40 kg/m  02/16/18 22.56 kg/m  07/25/17 22.64 kg/m   No results found for: HGBA1C OTC/Vit/Supp/Herbal: taking D 1000u qd. Dentist/Optho:  Both routinely Immunizations:  Immunization History  Administered Date(s) Administered  . Influenza,inj,Quad PF,6+ Mos 07/10/2015, 05/22/2016, 07/25/2017  . Pneumococcal Conjugate-13 07/03/2016  . Pneumococcal Polysaccharide-23 02/04/2013  . Tetanus 02/04/2013    Chronic Medical Conditions: HTN Pt checks his BP at home. Denies any elevated readings. Walks for exercise and has been eating a healthy, balanced diet.  Vit B12 Pt does injections every 10 days-3 wks. States he "listens to his body".  Last injection was about 10d ago.   Vit D He is currently taking Vit D 1000 iu supplementation. States he does not drink milk but eats salmon, broccoli, greens. Denies recent injuries.  Anxiety Typically takes 1 Xanax tab prn sleep and anxiety.  Medical History: Past Medical History:  Diagnosis Date  . Anemia   . Arthritis   . Depression    Past Surgical History:  Procedure Laterality Date  . CHOLECYSTECTOMY     Current Outpatient Medications on File Prior to Visit  Medication Sig Dispense Refill  . ALPRAZolam (XANAX) 0.5  MG tablet Take 2 tablets (1 mg total) by mouth at bedtime as needed for sleep. 60 tablet 5  . aspirin 81 MG tablet Take 81 mg by mouth daily.    . cyanocobalamin (,VITAMIN B-12,) 1000 MCG/ML injection Inject 1 mL every 14 days 10 mL 3  . hydrochlorothiazide (HYDRODIURIL) 25 MG tablet Take 1 tablet (25 mg total) by mouth daily. 90 tablet 3   No current facility-administered medications on file prior to visit.    Allergies  Allergen Reactions  . Bupropion Hcl   . Shrimp [Shellfish Allergy]    Family History  Problem Relation Age of Onset  . Heart disease Father   . Diabetes Father   . Cancer Father    Social History   Socioeconomic History  . Marital status: Married    Spouse name: Not on file  . Number of children: Not on file  . Years of education: Not on file  . Highest education level: Not on file  Occupational History  . Not on file  Social Needs  . Financial resource strain: Not on file  . Food insecurity:    Worry: Not on file    Inability: Not on file  . Transportation needs:    Medical: Not on file    Non-medical: Not on file  Tobacco Use  . Smoking status: Current Some Day Smoker    Types: Cigarettes    Last attempt to quit: 11/05/2003    Years since quitting: 14.7  . Smokeless tobacco: Never Used  Substance and Sexual Activity  . Alcohol  use: Not on file  . Drug use: Not on file  . Sexual activity: Not on file  Lifestyle  . Physical activity:    Days per week: Not on file    Minutes per session: Not on file  . Stress: Not on file  Relationships  . Social connections:    Talks on phone: Not on file    Gets together: Not on file    Attends religious service: Not on file    Active member of club or organization: Not on file    Attends meetings of clubs or organizations: Not on file    Relationship status: Not on file  Other Topics Concern  . Not on file  Social History Narrative  . Not on file   Depression screen [facility] 2/9 07/26/2018 02/16/2018  07/25/2017 01/21/2017 05/22/2016  Decreased Interest 0 0 0 0 0  Down, Depressed, Hopeless 0 0 0 0 0  PHQ - 2 Score 0 0 0 0 0     ROS Otherwise as noted in HPI  Objective:  BP 132/72   Pulse 72   Temp 98 F (36.7 C) (Oral)   Resp 16   Ht 5' 8.11" (1.73 m)   Wt 161 lb (73 kg)   SpO2 98%   BMI 24.40 kg/m   Visual Acuity Screening   Right eye Left eye Both eyes  Without correction: 20/25 20/25 20/25   With correction:      Physical Exam  Constitutional: He is oriented to person, place, and time. He appears well-developed and well-nourished. No distress.  HENT:  Head: Normocephalic and atraumatic.  Right Ear: Tympanic membrane, external ear and ear canal normal.  Left Ear: Tympanic membrane, external ear and ear canal normal.  Nose: Nose normal.  Mouth/Throat: Uvula is midline, oropharynx is clear and moist and mucous membranes are normal. No oropharyngeal exudate.  Eyes: Conjunctivae are normal. Right eye exhibits no discharge. Left eye exhibits no discharge. No scleral icterus.  Neck: Normal range of motion. Neck supple. No thyromegaly present.  Cardiovascular: Normal rate, regular rhythm, normal heart sounds and intact distal pulses.  Pulmonary/Chest: Effort normal and breath sounds normal. No respiratory distress.  Abdominal: Soft. Bowel sounds are normal. He exhibits no distension and no mass. There is no tenderness. There is no rebound and no guarding.  Genitourinary: Prostate normal. Prostate is not enlarged and not tender.  Musculoskeletal: He exhibits no edema.  Lymphadenopathy:    He has no cervical adenopathy.  Neurological: He is alert and oriented to person, place, and time. He has normal reflexes. He displays normal reflexes. No cranial nerve deficit. He exhibits normal muscle tone.  Skin: Skin is warm and dry. No rash noted. He is not diaphoretic. No erythema.  Psychiatric: He has a normal mood and affect. His behavior is normal.   [facility] Office Visit on  07/26/2018  Component Date Value Ref Range Status  . WBC 07/26/2018 6.9  3.4 - 10.8 x10E3/uL Final  . RBC 07/26/2018 4.62  4.14 - 5.80 x10E6/uL Final  . Hemoglobin 07/26/2018 10.7* 13.0 - 17.7 g/dL Final  . Hematocrit 07/26/2018 36.3* 37.5 - 51.0 % Final  . MCV 07/26/2018 79  79 - 97 fL Final  . MCH 07/26/2018 23.2* 26.6 - 33.0 pg Final  . MCHC 07/26/2018 29.5* 31.5 - 35.7 g/dL Final  . RDW 07/26/2018 17.8* 12.3 - 15.4 % Final  . Platelets 07/26/2018 353  150 - 450 x10E3/uL Final  . Neutrophils 07/26/2018 61  Not  Estab. % Final  . Lymphs 07/26/2018 26  Not Estab. % Final  . Monocytes 07/26/2018 9  Not Estab. % Final  . Eos 07/26/2018 2  Not Estab. % Final  . Basos 07/26/2018 1  Not Estab. % Final  . Neutrophils Absolute 07/26/2018 4.3  1.4 - 7.0 x10E3/uL Final  . Lymphocytes Absolute 07/26/2018 1.8  0.7 - 3.1 x10E3/uL Final  . Monocytes Absolute 07/26/2018 0.6  0.1 - 0.9 x10E3/uL Final  . EOS (ABSOLUTE) 07/26/2018 0.1  0.0 - 0.4 x10E3/uL Final  . Basophils Absolute 07/26/2018 0.0  0.0 - 0.2 x10E3/uL Final  . Immature Granulocytes 07/26/2018 1  Not Estab. % Final  . Immature Grans (Abs) 07/26/2018 0.0  0.0 - 0.1 x10E3/uL Final  . Glucose 07/26/2018 103* 65 - 99 mg/dL Final  . BUN 07/26/2018 14  8 - 27 mg/dL Final  . Creatinine, Ser 07/26/2018 0.98  0.76 - 1.27 mg/dL Final  . GFR calc non Af Amer 07/26/2018 76  >59 mL/min/1.73 Final  . GFR calc Af Amer 07/26/2018 88  >59 mL/min/1.73 Final  . BUN/Creatinine Ratio 07/26/2018 14  10 - 24 Final  . Sodium 07/26/2018 140  134 - 144 mmol/L Final  . Potassium 07/26/2018 4.4  3.5 - 5.2 mmol/L Final  . Chloride 07/26/2018 102  96 - 106 mmol/L Final  . CO2 07/26/2018 22  20 - 29 mmol/L Final  . Calcium 07/26/2018 9.4  8.6 - 10.2 mg/dL Final  . Total Protein 07/26/2018 6.9  6.0 - 8.5 g/dL Final  . Albumin 07/26/2018 4.3  3.5 - 4.8 g/dL Final  . Globulin, Total 07/26/2018 2.6  1.5 - 4.5 g/dL Final  . Albumin/Globulin Ratio 07/26/2018 1.7  1.2 -  2.2 Final  . Bilirubin Total 07/26/2018 0.2  0.0 - 1.2 mg/dL Final  . Alkaline Phosphatase 07/26/2018 71  39 - 117 IU/L Final  . AST 07/26/2018 21  0 - 40 IU/L Final  . ALT 07/26/2018 16  0 - 44 IU/L Final  . Prostate Specific Ag, Serum 07/26/2018 6.4* 0.0 - 4.0 ng/mL Final   Comment: Roche ECLIA methodology. According to the American Urological Association, Serum PSA should decrease and remain at undetectable levels after radical prostatectomy. The AUA defines biochemical recurrence as an initial PSA value 0.2 ng/mL or greater followed by a subsequent confirmatory PSA value 0.2 ng/mL or greater. Values obtained with different assay methods or kits cannot be used interchangeably. Results cannot be interpreted as absolute evidence of the presence or absence of malignant disease.   Marland Kitchen TSH 07/26/2018 2.120  0.450 - 4.500 uIU/mL Final  . Color, UA 07/26/2018 yellow  yellow Final  . Clarity, UA 07/26/2018 clear  clear Final  . Glucose, UA 07/26/2018 negative  negative mg/dL Final  . Bilirubin, UA 07/26/2018 negative  negative Final  . Ketones, POC UA 07/26/2018 negative  negative mg/dL Final  . Spec Grav, UA 07/26/2018 1.015  1.010 - 1.025 Final  . Blood, UA 07/26/2018 negative  negative Final  . pH, UA 07/26/2018 6.5  5.0 - 8.0 Final  . Protein Ur, POC 07/26/2018 negative  negative mg/dL Final  . Urobilinogen, UA 07/26/2018 0.2  0.2 or 1.0 E.U./dL Final  . Nitrite, UA 07/26/2018 Negative  Negative Final  . Leukocytes, UA 07/26/2018 Negative  Negative Final  . Vit D, 25-Hydroxy 07/26/2018 CANCELED  ng/mL Final-Edited   Comment: [facility] was unable to collect sufficient specimen to perform the following test(s), and is providing the patient with re-collection instructions. Vitamin  D deficiency has been defined by the [employer] practice guideline as a level of serum 25-OH vitamin D less than 20 ng/mL (1,2). The Endocrine Society went on to further  define vitamin D insufficiency as a level between 21 and 29 ng/mL (2). 1. IOM (Institute of Medicine). 2010. Dietary reference    intakes for calcium and D. [place]: The    [employer]. 2. Holick MF, Binkley [place], Bischoff-Ferrari HA, et al.    Evaluation, treatment, and prevention of vitamin D    deficiency: an Endocrine Society clinical practice    guideline. JCEM. 2011 Jul; 96(7):1911-30.  Result canceled by the ancillary.   . Vitamin B-12 07/26/2018 652  232 - 1,245 pg/mL Final  . Ferritin 07/26/2018 12* 30 - 400 ng/mL Final  . LDL Direct 07/26/2018 150* 0 - 99 mg/dL Final     Assessment & Plan:   1. Annual physical exam   2. Vitamin B12 deficiency   3. Mixed hyperlipidemia   4. Essential hypertension   5. Iron deficiency anemia, unspecified iron deficiency anemia type   6. Vitamin D deficiency   7. Screening for prostate cancer     NOT fasting today - will be fasting at CPE next  yr  Patient will continue on current chronic medications other than changes noted above, so ok to refill when needed.   Reviewed all health maintenance recommendations per USPSTF guidelines.   See after visit summary for patient specific instructions.  Orders Placed This Encounter  Procedures  . Flu vaccine HIGH DOSE PF (Fluzone High dose)  . CBC with Differential/Platelet  . Comprehensive metabolic panel    Order Specific Question:   Has the patient fasted?    Answer:   No  . PSA  . TSH  . Vitamin D, 25-hydroxy  . Vitamin B12  . Ferritin  . LDL Cholesterol, Direct  . POCT urinalysis dipstick  . POC Hemoccult Bld/Stl (3-Cd Home Screen)    Standing Status:   Future    Standing Expiration Date:   07/27/2019    Meds ordered this encounter  Medications  . augmented betamethasone dipropionate (DIPROLENE-AF) 0.05 % cream    Sig: Apply topically 2 (two) times daily. To pruritic papules on back    Dispense:  50 g    Refill:  0    Patient verbalized to me that they  understand the following: diagnosis, what is being done for them, what to expect and what should be done at home.  Their questions have been answered. They understand that I am unable to predict every possible medication interaction or adverse outcome and that if any unexpected symptoms arise, they should contact us and their pharmacist, as well as never hesitate to seek urgent/emergent care at [facility] or ER if they think it might be warranted.    Delman Cheadle, MD, MPH [facility] [address] [place], [place]  [phone] Office phone  [phone] Office fax   07/26/18 3:20 PM

## 2018-07-26 NOTE — Patient Instructions (Addendum)

## 2018-07-27 LAB — LDL CHOLESTEROL, DIRECT: LDL Direct: 150 mg/dL — ABNORMAL HIGH (ref 0–99)

## 2018-07-30 ENCOUNTER — Telehealth: Payer: Self-pay | Admitting: Family Medicine

## 2018-07-30 LAB — TSH: TSH: 2.12 u[IU]/mL (ref 0.450–4.500)

## 2018-07-30 LAB — VITAMIN D 25 HYDROXY (VIT D DEFICIENCY, FRACTURES)

## 2018-07-30 LAB — CBC WITH DIFFERENTIAL/PLATELET
Basophils Absolute: 0 10*3/uL (ref 0.0–0.2)
Basos: 1 %
EOS (ABSOLUTE): 0.1 10*3/uL (ref 0.0–0.4)
Eos: 2 %
HEMOGLOBIN: 10.7 g/dL — AB (ref 13.0–17.7)
Hematocrit: 36.3 % — ABNORMAL LOW (ref 37.5–51.0)
Immature Grans (Abs): 0 10*3/uL (ref 0.0–0.1)
Immature Granulocytes: 1 %
LYMPHS ABS: 1.8 10*3/uL (ref 0.7–3.1)
Lymphs: 26 %
MCH: 23.2 pg — AB (ref 26.6–33.0)
MCHC: 29.5 g/dL — AB (ref 31.5–35.7)
MCV: 79 fL (ref 79–97)
MONOCYTES: 9 %
Monocytes Absolute: 0.6 10*3/uL (ref 0.1–0.9)
NEUTROS ABS: 4.3 10*3/uL (ref 1.4–7.0)
Neutrophils: 61 %
Platelets: 353 10*3/uL (ref 150–450)
RBC: 4.62 x10E6/uL (ref 4.14–5.80)
RDW: 17.8 % — AB (ref 12.3–15.4)
WBC: 6.9 10*3/uL (ref 3.4–10.8)

## 2018-07-30 LAB — VITAMIN B12: Vitamin B-12: 652 pg/mL (ref 232–1245)

## 2018-07-30 LAB — COMPREHENSIVE METABOLIC PANEL
ALBUMIN: 4.3 g/dL (ref 3.5–4.8)
ALK PHOS: 71 IU/L (ref 39–117)
ALT: 16 IU/L (ref 0–44)
AST: 21 IU/L (ref 0–40)
Albumin/Globulin Ratio: 1.7 (ref 1.2–2.2)
BILIRUBIN TOTAL: 0.2 mg/dL (ref 0.0–1.2)
BUN / CREAT RATIO: 14 (ref 10–24)
BUN: 14 mg/dL (ref 8–27)
CHLORIDE: 102 mmol/L (ref 96–106)
CO2: 22 mmol/L (ref 20–29)
CREATININE: 0.98 mg/dL (ref 0.76–1.27)
Calcium: 9.4 mg/dL (ref 8.6–10.2)
GFR calc Af Amer: 88 mL/min/{1.73_m2} (ref >59)
GFR calc non Af Amer: 76 mL/min/{1.73_m2} (ref >59)
GLUCOSE: 103 mg/dL — AB (ref 65–99)
Globulin, Total: 2.6 g/dL (ref 1.5–4.5)
Potassium: 4.4 mmol/L (ref 3.5–5.2)
Sodium: 140 mmol/L (ref 134–144)
Total Protein: 6.9 g/dL (ref 6.0–8.5)

## 2018-07-30 LAB — PSA: PROSTATE SPECIFIC AG, SERUM: 6.4 ng/mL — AB (ref 0.0–4.0)

## 2018-07-30 LAB — FERRITIN: Ferritin: 12 ng/mL — ABNORMAL LOW (ref 30–400)

## 2018-07-30 NOTE — Telephone Encounter (Signed)
Patient was denied his medication Betamethasone Dipropionate Aug 0.05% cream by their insurance company.

## 2018-07-30 NOTE — Telephone Encounter (Signed)
Copied from [place] [id number]. Topic: General - Other >> Jul 30, 2018  3:08 PM Yvette Rack wrote: Reason for CRM: Hassan Rowan with [facility] stated the prior authorization for augmented betamethasone dipropionate (DIPROLENE-AF) 0.05 % cream was denied. Cb# [phone]  Option# 5  Message sent to Dr. Brigitte Pulse -

## 2018-08-01 NOTE — Telephone Encounter (Signed)
Please see if pt is going to pay out of pocket for this or would like me to send in a product of equivalent potency.

## 2018-08-02 NOTE — Telephone Encounter (Signed)
Patient was informed he will pay out of pocket for medication

## 2018-08-05 ENCOUNTER — Encounter: Payer: Self-pay | Admitting: Family Medicine

## 2018-08-11 ENCOUNTER — Other Ambulatory Visit: Payer: Self-pay | Admitting: Family Medicine

## 2018-08-11 NOTE — Telephone Encounter (Signed)
Requested medication (s) are due for refill today: yes  Requested medication (s) are on the active medication list: yes    Last refill: 02/16/18  #60 5 refills  Future visit scheduled yes  5/18 2020  Dr. Brigitte Pulse  Notes to clinic:not delegated  Requested Prescriptions  Pending Prescriptions Disp Refills   ALPRAZolam (XANAX) 0.5 MG tablet [Pharmacy Med Name: ALPRAZOLAM 0.5MG  TABLETS] 60 tablet 0    Sig: TAKE 2 TABLETS(1 MG) BY MOUTH AT BEDTIME AS NEEDED FOR SLEEP     Not Delegated - Psychiatry:  Anxiolytics/Hypnotics Failed - 08/11/2018  3:47 AM      Failed - This refill cannot be delegated      Failed - Urine Drug Screen completed in last 360 days.      Passed - Valid encounter within last 6 months    Recent Outpatient Visits          2 weeks ago Annual physical exam   [facility], Laurey Arrow, MD   5 months ago Essential hypertension   [facility], Laurey Arrow, MD   1 year ago Annual physical exam   [facility], Laurey Arrow, MD   1 year ago Cellulitis of other specified site   [facility] at [facility], Ines Bloomer, MD   2 years ago Medicare annual wellness visit, subsequent   [facility], Laurey Arrow, MD      Future Appointments            In 5 months Brigitte Pulse Laurey Arrow, MD [facility] [place], [facility]

## 2018-08-12 ENCOUNTER — Encounter: Payer: Medicare Other | Admitting: Family Medicine

## 2018-08-16 NOTE — Addendum Note (Signed)
Addended by: Shawnee Knapp on: 08/16/2018 04:17 PM   Modules accepted: Orders

## 2018-08-17 ENCOUNTER — Telehealth: Payer: Self-pay | Admitting: Family Medicine

## 2018-08-17 ENCOUNTER — Encounter: Payer: Self-pay | Admitting: Family Medicine

## 2018-08-17 NOTE — Telephone Encounter (Signed)
Called and spoke with pt to schedule an AWV in February with Dr. Brigitte Pulse. Pt stated that he was a little confused as he thought that Dr. Brigitte Pulse was wanting to do blood work because she didn't like the results of the last blood draw. I advised to send Dr. Brigitte Pulse a message via [place] and see if she wanted to make a separate appt and if she did then he could call back and make an appt.

## 2018-08-25 DIAGNOSIS — R972 Elevated prostate specific antigen [PSA]: Secondary | ICD-10-CM | POA: Diagnosis not present

## 2018-08-30 LAB — POC HEMOCCULT BLD/STL (HOME/3-CARD/SCREEN)
Card #2 Fecal Occult Blod, POC: NEGATIVE
Card #3 Fecal Occult Blood, POC: NEGATIVE
FECAL OCCULT BLD: NEGATIVE

## 2018-08-30 NOTE — Addendum Note (Signed)
Addended by: Gari Crown D on: 08/30/2018 04:12 PM   Modules accepted: Orders

## 2018-09-27 ENCOUNTER — Telehealth: Payer: Self-pay | Admitting: Family Medicine

## 2018-09-27 NOTE — Telephone Encounter (Signed)
MyChart message sent to pt about their appointment on 10/11/18 with Dr Brigitte Pulse.

## 2018-10-08 DIAGNOSIS — R0602 Shortness of breath: Secondary | ICD-10-CM | POA: Diagnosis not present

## 2018-10-08 DIAGNOSIS — Z743 Need for continuous supervision: Secondary | ICD-10-CM | POA: Diagnosis not present

## 2018-10-11 ENCOUNTER — Ambulatory Visit: Payer: Medicare Other | Admitting: Family Medicine

## 2018-10-14 DIAGNOSIS — C61 Malignant neoplasm of prostate: Secondary | ICD-10-CM

## 2018-10-14 DIAGNOSIS — R972 Elevated prostate specific antigen [PSA]: Secondary | ICD-10-CM | POA: Diagnosis not present

## 2018-10-14 HISTORY — DX: Malignant neoplasm of prostate: C61

## 2018-10-19 ENCOUNTER — Ambulatory Visit (INDEPENDENT_AMBULATORY_CARE_PROVIDER_SITE_OTHER): Payer: Medicare Other | Admitting: Family Medicine

## 2018-10-19 VITALS — BP 148/90 | Ht 68.13 in | Wt 156.8 lb

## 2018-10-19 DIAGNOSIS — Z0000 Encounter for general adult medical examination without abnormal findings: Secondary | ICD-10-CM

## 2018-10-19 NOTE — Progress Notes (Signed)
Presents today for Medicare Annual Wellness Visit    Patient Care Team: Shawnee Knapp, MD as PCP - General (Family Medicine) Sable Feil, MD as Consulting Physician (Gastroenterology) Michael Boston, MD as Consulting Physician (General Surgery) Ceasar Mons, MD as Consulting Physician (Urology)    Immunization status:  Immunization History  Administered Date(s) Administered  . Influenza, High Dose Seasonal PF 07/26/2018  . Influenza,inj,Quad PF,6+ Mos 07/10/2015, 05/22/2016, 07/25/2017  . Pneumococcal Conjugate-13 07/03/2016  . Pneumococcal Polysaccharide-23 02/04/2013  . Tetanus 02/04/2013     There are no preventive care reminders to display for this patient.   Functional Status Survey: Is the patient deaf or have difficulty hearing?: No Does the patient have difficulty seeing, even when wearing glasses/contacts?: No Does the patient have difficulty concentrating, remembering, or making decisions?: No Does the patient have difficulty walking or climbing stairs?: No Does the patient have difficulty dressing or bathing?: No Does the patient have difficulty doing errands alone such as visiting a doctor's office or shopping?: No   6CIT Screen 10/19/2018  What Year? 0 points  What month? 0 points  What time? 0 points  Count back from 20 0 points  Months in reverse 0 points  Repeat phrase 0 points  Total Score 0        Clinical Support from 10/19/2018 in Primary Care at [facility]  AUDIT-C Score  4     Patient Active Problem List   Diagnosis Date Noted  . Arthritis of carpometacarpal [facility]) joint of right thumb 07/25/2016  . Vitamin B12 deficiency 04/26/2009  . Hyperlipidemia 04/26/2009  . Essential hypertension 04/26/2009     Past Medical History:  Diagnosis Date  . Anemia   . Arthritis   . Depression      Past Surgical History:  Procedure Laterality Date  . CHOLECYSTECTOMY       Family History  Problem Relation Age of Onset  .  Heart disease Father   . Diabetes Father   . Cancer Father      Social History   Socioeconomic History  . Marital status: Married    Spouse name: Not on file  . Number of children: Not on file  . Years of education: Not on file  . Highest education level: Not on file  Occupational History  . Not on file  Social Needs  . Financial resource strain: Not on file  . Food insecurity:    Worry: Not on file    Inability: Not on file  . Transportation needs:    Medical: Not on file    Non-medical: Not on file  Tobacco Use  . Smoking status: Current Some Day Smoker    Types: Cigarettes    Last attempt to quit: 11/05/2003    Years since quitting: 14.9  . Smokeless tobacco: Never Used  Substance and Sexual Activity  . Alcohol use: Not on file  . Drug use: Not on file  . Sexual activity: Not on file  Lifestyle  . Physical activity:    Days per week: Not on file    Minutes per session: Not on file  . Stress: Not on file  Relationships  . Social connections:    Talks on phone: Not on file    Gets together: Not on file    Attends religious service: Not on file    Active member of club or organization: Not on file    Attends meetings of clubs or organizations: Not on file  Relationship status: Not on file  . Intimate partner violence:    Fear of current or ex partner: Not on file    Emotionally abused: Not on file    Physically abused: Not on file    Forced sexual activity: Not on file  Other Topics Concern  . Not on file  Social History Narrative  . Not on file     Allergies  Allergen Reactions  . Bupropion Hcl   . Shrimp [Shellfish Allergy]      Prior to Admission medications   Medication Sig Start Date End Date Taking? Authorizing Provider  ALPRAZolam Duanne Moron) 0.5 MG tablet TAKE 2 TABLETS(1 MG) BY MOUTH AT BEDTIME AS NEEDED FOR SLEEP 08/16/18  Yes Shawnee Knapp, MD  aspirin 81 MG tablet Take 81 mg by mouth daily.   Yes [provider]  augmented  betamethasone dipropionate (DIPROLENE-AF) 0.05 % cream Apply topically 2 (two) times daily. To pruritic papules on back 07/26/18  Yes Shawnee Knapp, MD  cyanocobalamin (,VITAMIN B-12,) 1000 MCG/ML injection Inject 1 mL every 14 days 02/16/18  Yes Shawnee Knapp, MD  hydrochlorothiazide (HYDRODIURIL) 25 MG tablet Take 1 tablet (25 mg total) by mouth daily. 02/16/18  Yes Shawnee Knapp, MD     Depression screen [facility] 2/9 10/19/2018 07/26/2018 02/16/2018 07/25/2017 01/21/2017  Decreased Interest 0 0 0 0 0  Down, Depressed, Hopeless 0 0 0 0 0  PHQ - 2 Score 0 0 0 0 0     Fall Risk  10/19/2018 07/26/2018 02/16/2018 07/25/2017 01/21/2017  Falls in the past year? 0 1 No No No  Number falls in past yr: 0 1 - - -  Injury with Fall? 0 0 - - -      PHYSICAL EXAM: BP (!) 148/90   Ht 5' 8.13" (1.731 m)   Wt 156 lb 12.8 oz (71.1 kg)   BMI 23.75 kg/m    Wt Readings from Last 3 Encounters:  10/19/18 156 lb 12.8 oz (71.1 kg)  07/26/18 161 lb (73 kg)  02/16/18 148 lb 6.4 oz (67.3 kg)     No exam data present    Physical Exam   Education/Counseling provided regarding diet and exercise, prevention of chronic diseases, smoking/tobacco cessation, if applicable, and reviewed "Covered Medicare Preventive Services."   ASSESSMENT/PLAN: There are no diagnoses linked to this encounter.    Lives with wife and two cats one floor home.  Home free of clutter, scattered rugs secure, well light, Grab bar in shower.   Walks every morning.  Up to date on all immunizations  Advanced directives.  Living will Yes

## 2018-10-19 NOTE — Patient Instructions (Signed)
Thank you for taking time to come for your Medicare Wellness Visit. I appreciate your ongoing commitment to your health goals. Please review the following plan we discussed and let me know if I can assist you in the future.  Julie Greer LPN  Healthy Eating Following a healthy eating pattern may help you to achieve and maintain a healthy body weight, reduce the risk of chronic disease, and live a long and productive life. It is important to follow a healthy eating pattern at an appropriate calorie level for your body. Your nutritional needs should be met primarily through food by choosing a variety of nutrient-rich foods. What are tips for following this plan? Reading food labels  Read labels and choose the following: ? Reduced or low sodium. ? Juices with 100% fruit juice. ? Foods with low saturated fats and high polyunsaturated and monounsaturated fats. ? Foods with whole grains, such as whole wheat, cracked wheat, brown rice, and wild rice. ? Whole grains that are fortified with folic acid. This is recommended for women who are pregnant or who want to become pregnant.  Read labels and avoid the following: ? Foods with a lot of added sugars. These include foods that contain brown sugar, corn sweetener, corn syrup, dextrose, fructose, glucose, high-fructose corn syrup, honey, invert sugar, lactose, malt syrup, maltose, molasses, raw sugar, sucrose, trehalose, or turbinado sugar.  Do not eat more than the following amounts of added sugar per day:  6 teaspoons (25 g) for women.  9 teaspoons (38 g) for men. ? Foods that contain processed or refined starches and grains. ? Refined grain products, such as white flour, degermed cornmeal, white bread, and white rice. Shopping  Choose nutrient-rich snacks, such as vegetables, whole fruits, and nuts. Avoid high-calorie and high-sugar snacks, such as potato chips, fruit snacks, and candy.  Use oil-based dressings and spreads on foods instead of  solid fats such as butter, stick margarine, or cream cheese.  Limit pre-made sauces, mixes, and "instant" products such as flavored rice, instant noodles, and ready-made pasta.  Try more plant-protein sources, such as tofu, tempeh, black beans, edamame, lentils, nuts, and seeds.  Explore eating plans such as the Mediterranean diet or vegetarian diet. Cooking  Use oil to saut or stir-fry foods instead of solid fats such as butter, stick margarine, or lard.  Try baking, boiling, grilling, or broiling instead of frying.  Remove the fatty part of meats before cooking.  Steam vegetables in water or broth. Meal planning   At meals, imagine dividing your plate into fourths: ? One-half of your plate is fruits and vegetables. ? One-fourth of your plate is whole grains. ? One-fourth of your plate is protein, especially lean meats, poultry, eggs, tofu, beans, or nuts.  Include low-fat dairy as part of your daily diet. Lifestyle  Choose healthy options in all settings, including home, work, school, restaurants, or stores.  Prepare your food safely: ? Wash your hands after handling raw meats. ? Keep food preparation surfaces clean by regularly washing with hot, soapy water. ? Keep raw meats separate from ready-to-eat foods, such as fruits and vegetables. ? Cook seafood, meat, poultry, and eggs to the recommended internal temperature. ? Store foods at safe temperatures. In general:  Keep cold foods at 40F (4.4C) or below.  Keep hot foods at 140F (60C) or above.  Keep your freezer at 0F (-17.8C) or below.  Foods are no longer safe to eat when they have been between the temperatures of 40-140F (4.4-60C) for   more than 2 hours. What foods should I eat? Fruits Aim to eat 2 cup-equivalents of fresh, canned (in natural juice), or frozen fruits each day. Examples of 1 cup-equivalent of fruit include 1 small apple, 8 large strawberries, 1 cup canned fruit,  cup dried fruit, or 1 cup  100% juice. Vegetables Aim to eat 2-3 cup-equivalents of fresh and frozen vegetables each day, including different varieties and colors. Examples of 1 cup-equivalent of vegetables include 2 medium carrots, 2 cups raw, leafy greens, 1 cup chopped vegetable (raw or cooked), or 1 medium baked potato. Grains Aim to eat 6 ounce-equivalents of whole grains each day. Examples of 1 ounce-equivalent of grains include 1 slice of bread, 1 cup ready-to-eat cereal, 3 cups popcorn, or  cup cooked rice, pasta, or cereal. Meats and other proteins Aim to eat 5-6 ounce-equivalents of protein each day. Examples of 1 ounce-equivalent of protein include 1 egg, 1/2 cup nuts or seeds, or 1 tablespoon (16 g) peanut butter. A cut of meat or fish that is the size of a deck of cards is about 3-4 ounce-equivalents.  Of the protein you eat each week, try to have at least 8 ounces come from seafood. This includes salmon, trout, herring, and anchovies. Dairy Aim to eat 3 cup-equivalents of fat-free or low-fat dairy each day. Examples of 1 cup-equivalent of dairy include 1 cup (240 mL) milk, 8 ounces (250 g) yogurt, 1 ounces (44 g) natural cheese, or 1 cup (240 mL) fortified soy milk. Fats and oils  Aim for about 5 teaspoons (21 g) per day. Choose monounsaturated fats, such as canola and olive oils, avocados, peanut butter, and most nuts, or polyunsaturated fats, such as sunflower, corn, and soybean oils, walnuts, pine nuts, sesame seeds, sunflower seeds, and flaxseed. Beverages  Aim for six 8-oz glasses of water per day. Limit coffee to three to five 8-oz cups per day.  Limit caffeinated beverages that have added calories, such as soda and energy drinks.  Limit alcohol intake to no more than 1 drink a day for nonpregnant women and 2 drinks a day for men. One drink equals 12 oz of beer (355 mL), 5 oz of wine (148 mL), or 1 oz of hard liquor (44 mL). Seasoning and other foods  Avoid adding excess amounts of salt to your  foods. Try flavoring foods with herbs and spices instead of salt.  Avoid adding sugar to foods.  Try using oil-based dressings, sauces, and spreads instead of solid fats. This information is based on general [place] nutrition guidelines. For more information, visit choosemyplate.gov. Exact amounts may vary based on your nutrition needs. Summary  A healthy eating plan may help you to maintain a healthy weight, reduce the risk of chronic diseases, and stay active throughout your life.  Plan your meals. Make sure you eat the right portions of a variety of nutrient-rich foods.  Try baking, boiling, grilling, or broiling instead of frying.  Choose healthy options in all settings, including home, work, school, restaurants, or stores. This information is not intended to replace advice given to you by your health care provider. Make sure you discuss any questions you have with your health care provider. Document Released: 12/07/2017 Document Revised: 12/07/2017 Document Reviewed: 12/07/2017 Elsevier Interactive Patient Education  2019 [employer]vier Inc.  

## 2018-10-21 DIAGNOSIS — C61 Malignant neoplasm of prostate: Secondary | ICD-10-CM | POA: Diagnosis not present

## 2018-10-29 DIAGNOSIS — C61 Malignant neoplasm of prostate: Secondary | ICD-10-CM | POA: Diagnosis not present

## 2018-11-02 ENCOUNTER — Other Ambulatory Visit: Payer: Self-pay

## 2018-11-02 ENCOUNTER — Ambulatory Visit (INDEPENDENT_AMBULATORY_CARE_PROVIDER_SITE_OTHER): Payer: Medicare Other | Admitting: Family Medicine

## 2018-11-02 ENCOUNTER — Encounter: Payer: Self-pay | Admitting: Family Medicine

## 2018-11-02 VITALS — BP 132/86 | HR 72 | Temp 98.4°F | Ht 68.0 in | Wt 154.8 lb

## 2018-11-02 DIAGNOSIS — C61 Malignant neoplasm of prostate: Secondary | ICD-10-CM

## 2018-11-02 DIAGNOSIS — I10 Essential (primary) hypertension: Secondary | ICD-10-CM

## 2018-11-02 DIAGNOSIS — D509 Iron deficiency anemia, unspecified: Secondary | ICD-10-CM | POA: Diagnosis not present

## 2018-11-02 DIAGNOSIS — R6889 Other general symptoms and signs: Secondary | ICD-10-CM

## 2018-11-02 LAB — POC INFLUENZA A&B (BINAX/QUICKVUE)
Influenza A, POC: NEGATIVE
Influenza A, POC: NEGATIVE
Influenza B, POC: NEGATIVE
Influenza B, POC: NEGATIVE

## 2018-11-02 NOTE — Patient Instructions (Signed)
° ° ° °  If you have lab work done today you will be contacted with your lab results within the next 2 weeks.  If you have not heard from us then please contact us. The fastest way to get your results is to register for My Chart. ° ° °IF you received an x-ray today, you will receive an invoice from [facility]. Please contact [facility] at [phone] with questions or concerns regarding your invoice.  ° °IF you received labwork today, you will receive an invoice from [facility]. Please contact [facility] at [phone] with questions or concerns regarding your invoice.  ° °Our billing staff will not be able to assist you with questions regarding bills from these companies. ° °You will be contacted with the lab results as soon as they are available. The fastest way to get your results is to activate your My Chart account. Instructions are located on the last page of this paperwork. If you have not heard from us regarding the results in 2 weeks, please contact this office. °  ° ° ° °

## 2018-11-02 NOTE — Progress Notes (Signed)
2/25/20201:53 PM  Jackson Perry 03/27/1946, 73 y.o. male [id number]  Chief Complaint  Patient presents with  . Results    not sure if he took hos medication today. Was having flu like symptoms yesterday. Feeling better today. Wants iron checked    HPI:   Patient is a 73 y.o. male with past medical history significant for HTN, HLP, anemia iron and b12, recent diagnosis of prostate cancer who presents today to have iron levels checked  PCP Dr Brigitte Pulse Last OV Nov 2019  FOBT cards negative x 3 in dec 2019  Yesterday started having sudden rigors, chills, sneezing, cough, headache and body aches Has had flu vaccine this season  No known exposure Has been taking ASA as needed Started iron supplement 325mg  daily Not vegeterian, no issues with gluten Had EGD and colonoscopy in 2010 for + FOBTIA - no active source  Brings in prostate biopsy results Gleason score 7 with perineural invasion Has had CT scan, results pending  Sees urology until late March  Thinks he forgot to take his BP med today Per patient BP at recent urology OVs have been ok  Fall Risk  10/19/2018 07/26/2018 02/16/2018 07/25/2017 01/21/2017  Falls in the past year? 0 1 No No No  Number falls in past yr: 0 1 - - -  Injury with Fall? 0 0 - - -     Depression screen [facility] 2/9 10/19/2018 07/26/2018 02/16/2018  Decreased Interest 0 0 0  Down, Depressed, Hopeless 0 0 0  PHQ - 2 Score 0 0 0    Allergies  Allergen Reactions  . Bupropion Hcl   . Shrimp [Shellfish Allergy]     Prior to Admission medications   Medication Sig Start Date End Date Taking? Authorizing Provider  ALPRAZolam Duanne Moron) 0.5 MG tablet TAKE 2 TABLETS(1 MG) BY MOUTH AT BEDTIME AS NEEDED FOR SLEEP 08/16/18  Yes Shawnee Knapp, MD  aspirin 81 MG tablet Take 81 mg by mouth daily.   Yes [provider]  augmented betamethasone dipropionate (DIPROLENE-AF) 0.05 % cream Apply topically 2 (two) times daily. To pruritic papules on back 07/26/18  Yes  Shawnee Knapp, MD  cyanocobalamin (,VITAMIN B-12,) 1000 MCG/ML injection Inject 1 mL every 14 days 02/16/18  Yes Shawnee Knapp, MD  hydrochlorothiazide (HYDRODIURIL) 25 MG tablet Take 1 tablet (25 mg total) by mouth daily. 02/16/18  Yes Shawnee Knapp, MD    Past Medical History:  Diagnosis Date  . Anemia   . Arthritis   . Depression     Past Surgical History:  Procedure Laterality Date  . CHOLECYSTECTOMY      Social History   Tobacco Use  . Smoking status: Current Some Day Smoker    Types: Cigarettes    Last attempt to quit: 11/05/2003    Years since quitting: 15.0  . Smokeless tobacco: Never Used  Substance Use Topics  . Alcohol use: Not on file    Family History  Problem Relation Age of Onset  . Heart disease Father   . Diabetes Father   . Cancer Father     ROS Per hpi  OBJECTIVE:  Blood pressure (!) 159/95, pulse 72, temperature 98.4 F (36.9 C), temperature source Oral, height 5\' 8"  (1.727 m), weight 154 lb 12.8 oz (70.2 kg), SpO2 95 %. Body mass index is 23.54 kg/m.   BP Readings from Last 3 Encounters:  11/02/18 (!) 159/95  10/19/18 (!) 148/90  07/26/18 132/72    Physical Exam  Vitals signs and nursing note reviewed.  Constitutional:      Appearance: He is well-developed.  HENT:     Head: Normocephalic and atraumatic.     Right Ear: Hearing, tympanic membrane, ear canal and external ear normal.     Left Ear: Hearing, tympanic membrane, ear canal and external ear normal.     Mouth/Throat:     Pharynx: Posterior oropharyngeal erythema present. No oropharyngeal exudate.  Eyes:     Conjunctiva/sclera: Conjunctivae normal.     Pupils: Pupils are equal, round, and reactive to light.  Neck:     Musculoskeletal: Neck supple.  Cardiovascular:     Rate and Rhythm: Normal rate and regular rhythm.     Heart sounds: No murmur. No friction rub. No gallop.   Pulmonary:     Effort: Pulmonary effort is normal.     Breath sounds: Normal breath sounds. No wheezing or  rales.  Lymphadenopathy:     Cervical: Cervical adenopathy present.  Skin:    General: Skin is warm and dry.  Neurological:     Mental Status: He is alert and oriented to person, place, and time.     Results for orders placed or performed in visit on 11/02/18 (from the past 24 hour(s))  POC Influenza A&B (Binax test)     Status: Normal   Collection Time: 11/02/18  2:48 PM  Result Value Ref Range   Influenza A, POC Negative Negative   Influenza B, POC Negative Negative  POC Influenza A&B(BINAX/QUICKVUE)     Status: Normal   Collection Time: 11/02/18  2:49 PM  Result Value Ref Range   Influenza A, POC Negative Negative   Influenza B, POC Negative Negative    ASSESSMENT and PLAN  1. Iron deficiency anemia, unspecified iron deficiency anemia type Unclear cause. Does having pernicious anemia increase risk for iron deficiency? FOBT negative. Checking labs today. Continue with supplementation.  - Iron, TIBC and Ferritin Panel - CBC  2. Flu-like symptoms Flu test negative. Discussed supportive measures for URI: increase hydration, rest, OTC medications, etc. RTC precautions discussed. - POC Influenza A&B(BINAX/QUICKVUE) - POC Influenza A&B (Binax test)  3. Prostate cancer [facility]) New diagnosis, managed by urology  4. Essential hypertension Elevated in setting of not taking medication this morning.  Return for as scheduled.    Rutherford Guys, MD [facility] at [facility] [place], [place] [address] Ph.  [phone] Fax [phone]

## 2018-11-03 LAB — CBC
Hematocrit: 42.3 % (ref 37.5–51.0)
Hemoglobin: 13.8 g/dL (ref 13.0–17.7)
MCH: 28 pg (ref 26.6–33.0)
MCHC: 32.6 g/dL (ref 31.5–35.7)
MCV: 86 fL (ref 79–97)
Platelets: 224 10*3/uL (ref 150–450)
RBC: 4.92 x10E6/uL (ref 4.14–5.80)
RDW: 18.2 % — ABNORMAL HIGH (ref 11.6–15.4)
WBC: 5.8 10*3/uL (ref 3.4–10.8)

## 2018-11-03 LAB — IRON,TIBC AND FERRITIN PANEL
Ferritin: 49 ng/mL (ref 30–400)
Iron Saturation: 48 % (ref 15–55)
Iron: 159 ug/dL (ref 38–169)
Total Iron Binding Capacity: 334 ug/dL (ref 250–450)
UIBC: 175 ug/dL (ref 111–343)

## 2018-11-17 ENCOUNTER — Encounter: Payer: Self-pay | Admitting: Family Medicine

## 2018-11-19 ENCOUNTER — Other Ambulatory Visit: Payer: Self-pay

## 2018-11-19 ENCOUNTER — Ambulatory Visit (INDEPENDENT_AMBULATORY_CARE_PROVIDER_SITE_OTHER): Payer: Medicare Other | Admitting: Family Medicine

## 2018-11-19 ENCOUNTER — Encounter: Payer: Self-pay | Admitting: Family Medicine

## 2018-11-19 VITALS — BP 116/70 | HR 76 | Temp 97.9°F | Resp 18 | Ht 68.0 in | Wt 154.4 lb

## 2018-11-19 DIAGNOSIS — L84 Corns and callosities: Secondary | ICD-10-CM | POA: Diagnosis not present

## 2018-11-19 NOTE — Patient Instructions (Addendum)
If you have lab work done today you will be contacted with your lab results within the next 2 weeks.  If you have not heard from [place] then please contact us. The fastest way to get your results is to register for My Chart.   IF you received an x-ray today, you will receive an invoice from [facility]. Please contact [facility] at [phone] with questions or concerns regarding your invoice.   IF you received labwork today, you will receive an invoice from [facility]. Please contact [facility] at [phone] with questions or concerns regarding your invoice.   Our billing staff will not be able to assist you with questions regarding bills from these companies.  You will be contacted with the lab results as soon as they are available. The fastest way to get your results is to activate your My Chart account. Instructions are located on the last page of this paperwork. If you have not heard from [place] regarding the results in 2 weeks, please contact this office.     Corns and Calluses Corns are small areas of thickened skin that occur on the top, sides, or tip of a toe. They contain a cone-shaped core with a point that can press on a nerve below. This causes pain.  Calluses are areas of thickened skin that can occur anywhere on the body, including the hands, fingers, palms, soles of the feet, and heels. Calluses are usually larger than corns. What are the causes? Corns and calluses are caused by rubbing (friction) or pressure, such as from shoes that are too tight or do not fit properly. What increases the risk? Corns are more likely to develop in people who have misshapen toes (toe deformities), such as hammer toes. Calluses can occur with friction to any area of the skin. They are more likely to develop in people who:  Work with their hands.  Wear shoes that fit poorly, are too tight, or are high-heeled.  Have toe deformities. What are the signs or symptoms? Symptoms of  a corn or callus include:  A hard growth on the skin.  Pain or tenderness under the skin.  Redness and swelling.  Increased discomfort while wearing tight-fitting shoes, if your feet are affected. If a corn or callus becomes infected, symptoms may include:  Redness and swelling that gets worse.  Pain.  Fluid, blood, or pus draining from the corn or callus. How is this diagnosed? Corns and calluses may be diagnosed based on your symptoms, your medical history, and a physical exam. How is this treated? Treatment for corns and calluses may include:  Removing the cause of the friction or pressure. This may involve: ? Changing your shoes. ? Wearing shoe inserts (orthotics) or other protective layers in your shoes, such as a corn pad. ? Wearing gloves.  Applying medicine to the skin (topical medicine) to help soften skin in the hardened, thickened areas.  Removing layers of dead skin with a file to reduce the size of the corn or callus.  Removing the corn or callus with a scalpel or laser.  Taking antibiotic medicines, if your corn or callus is infected.  Having surgery, if a toe deformity is the cause. Follow these instructions at home:   Take over-the-counter and prescription medicines only as told by your health care provider.  If you were prescribed an antibiotic, take it as told by your health care provider. Do not stop taking it even if your condition starts to improve.  Wear shoes that fit well. Avoid wearing high-heeled shoes and shoes that are too tight or too loose.  Wear any padding, protective layers, gloves, or orthotics as told by your health care provider.  Soak your hands or feet and then use a file or pumice stone to soften your corn or callus. Do this as told by your health care provider.  Check your corn or callus every day for symptoms of infection. Contact a health care provider if you:  Notice that your symptoms do not improve with treatment.  Have  redness or swelling that gets worse.  Notice that your corn or callus becomes painful.  Have fluid, blood, or pus coming from your corn or callus.  Have new symptoms. Summary  Corns are small areas of thickened skin that occur on the top, sides, or tip of a toe.  Calluses are areas of thickened skin that can occur anywhere on the body, including the hands, fingers, palms, and soles of the feet. Calluses are usually larger than corns.  Corns and calluses are caused by rubbing (friction) or pressure, such as from shoes that are too tight or do not fit properly.  Treatment may include wearing any padding, protective layers, gloves, or orthotics as told by your health care provider. This information is not intended to replace advice given to you by your health care provider. Make sure you discuss any questions you have with your health care provider. Document Released: 05/31/2004 Document Revised: 07/08/2017 Document Reviewed: 07/08/2017 Elsevier Interactive Patient Education  2019 [employer].

## 2018-11-19 NOTE — Progress Notes (Signed)
Established Patient Office Visit  Subjective:  Patient ID: Jackson Perry, male    DOB: [birth year]  Age: 73 y.o. MRN: [id number]  CC:  Chief Complaint  Patient presents with  . Toe Pain    corn on toe right foot second toe x3weeks     HPI Jackson Perry presents for corn on toe of the right foot Patient reports that he has pain now that his corns are big and rubbing between his great toe and 2nd toe He denies any fevers or chills  Last episode was in 2013 and was pared down and frozen in the office He has not seen Podiatry for this  Past Medical History:  Diagnosis Date  . Anemia   . Arthritis   . Depression   . Prostate cancer ([place]) 10/14/2018    Past Surgical History:  Procedure Laterality Date  . CHOLECYSTECTOMY      Family History  Problem Relation Age of Onset  . Heart disease Father   . Diabetes Father   . Cancer Father     Social History   Socioeconomic History  . Marital status: Married    Spouse name: Not on file  . Number of children: Not on file  . Years of education: Not on file  . Highest education level: Not on file  Occupational History  . Not on file  Social Needs  . Financial resource strain: Not on file  . Food insecurity:    Worry: Not on file    Inability: Not on file  . Transportation needs:    Medical: Not on file    Non-medical: Not on file  Tobacco Use  . Smoking status: Current Some Day Smoker    Types: Cigarettes    Last attempt to quit: 11/05/2003    Years since quitting: 15.0  . Smokeless tobacco: Never Used  Substance and Sexual Activity  . Alcohol use: Not on file  . Drug use: Not on file  . Sexual activity: Not on file  Lifestyle  . Physical activity:    Days per week: Not on file    Minutes per session: Not on file  . Stress: Not on file  Relationships  . Social connections:    Talks on phone: Not on file    Gets together: Not on file    Attends religious service: Not on file    Active member of club or  organization: Not on file    Attends meetings of clubs or organizations: Not on file    Relationship status: Not on file  . Intimate partner violence:    Fear of current or ex partner: Not on file    Emotionally abused: Not on file    Physically abused: Not on file    Forced sexual activity: Not on file  Other Topics Concern  . Not on file  Social History Narrative  . Not on file    Outpatient Medications Prior to Visit  Medication Sig Dispense Refill  . ALPRAZolam (XANAX) 0.5 MG tablet TAKE 2 TABLETS(1 MG) BY MOUTH AT BEDTIME AS NEEDED FOR SLEEP 60 tablet 5  . aspirin 81 MG tablet Take 81 mg by mouth daily.    Marland Kitchen augmented betamethasone dipropionate (DIPROLENE-AF) 0.05 % cream Apply topically 2 (two) times daily. To pruritic papules on back 50 g 0  . cyanocobalamin (,VITAMIN B-12,) 1000 MCG/ML injection Inject 1 mL every 14 days 10 mL 3  . hydrochlorothiazide (HYDRODIURIL) 25 MG tablet Take 1 tablet (  25 mg total) by mouth daily. 90 tablet 3   No facility-administered medications prior to visit.     Allergies  Allergen Reactions  . Bupropion Hcl   . Shrimp [Shellfish Allergy]     ROS Review of Systems    Objective:    Physical Exam  BP 116/70   Pulse 76   Temp 97.9 F (36.6 C) (Oral)   Resp 18   Ht 5\' 8"  (1.727 m)   Wt 154 lb 6.4 oz (70 kg)   SpO2 96%   BMI 23.48 kg/m  Wt Readings from Last 3 Encounters:  11/19/18 154 lb 6.4 oz (70 kg)  11/02/18 154 lb 12.8 oz (70.2 kg)  10/19/18 156 lb 12.8 oz (71.1 kg)   Physical Exam  Constitutional: Oriented to person, place, and time. Appears well-developed and well-nourished.  HENT:  Head: Normocephalic and atraumatic.  Eyes: Conjunctivae and EOM are normal.  Pulmonary/Chest: Effort normal.  Neurological: Is alert and oriented to person, place, and time.  Skin: Skin is warm. Capillary refill takes less than 2 seconds.  Psychiatric: Has a normal mood and affect. Behavior is normal. Judgment and thought content normal.    Medial sides of the great toe and the 2nd toe with calloused skin and corns nontender Pared down with 10 blade scapel Liquid nitrogen applied   There are no preventive care reminders to display for this patient.  There are no preventive care reminders to display for this patient.  Lab Results  Component Value Date   TSH 2.120 07/26/2018   Lab Results  Component Value Date   WBC 5.8 11/02/2018   HGB 13.8 11/02/2018   HCT 42.3 11/02/2018   MCV 86 11/02/2018   PLT 224 11/02/2018   Lab Results  Component Value Date   NA 140 07/26/2018   K 4.4 07/26/2018   CO2 22 07/26/2018   GLUCOSE 103 (H) 07/26/2018   BUN 14 07/26/2018   CREATININE 0.98 07/26/2018   BILITOT 0.2 07/26/2018   ALKPHOS 71 07/26/2018   AST 21 07/26/2018   ALT 16 07/26/2018   PROT 6.9 07/26/2018   ALBUMIN 4.3 07/26/2018   CALCIUM 9.4 07/26/2018   Lab Results  Component Value Date   CHOL 228 (H) 07/25/2017   Lab Results  Component Value Date   HDL 48 07/25/2017   Lab Results  Component Value Date   LDLCALC 166 (H) 07/25/2017   Lab Results  Component Value Date   TRIG 72 07/25/2017   Lab Results  Component Value Date   CHOLHDL 4.8 07/25/2017   No results found for: HGBA1C    Assessment & Plan:   Problem List Items Addressed This Visit    None    Visit Diagnoses    Corn of toe    -  Primary     Pared down corn today Advised to avoid friction Crossing of the toes should be evaluated by podiatry   No orders of the defined types were placed in this encounter.   Follow-up: Return if symptoms worsen or fail to improve.    Forrest Moron, MD

## 2018-11-25 ENCOUNTER — Telehealth: Payer: Self-pay | Admitting: *Deleted

## 2018-11-25 NOTE — Telephone Encounter (Signed)
Called patient to schedule appointment with Dr. Tammi Klippel. Patient wishes to watch and wait and wishes not to have consultation.

## 2019-01-11 ENCOUNTER — Ambulatory Visit: Payer: Medicare Other | Admitting: Family Medicine

## 2019-01-24 ENCOUNTER — Ambulatory Visit: Payer: Medicare Other | Admitting: Family Medicine

## 2019-02-03 ENCOUNTER — Ambulatory Visit: Payer: Medicare Other | Admitting: Family Medicine

## 2019-02-09 NOTE — Progress Notes (Signed)
I was was available for the history and physical exam. I personally discussed this patient at the time of the office visit and agree with the assessment and plan.

## 2019-02-16 ENCOUNTER — Ambulatory Visit (INDEPENDENT_AMBULATORY_CARE_PROVIDER_SITE_OTHER): Payer: Medicare Other | Admitting: Family Medicine

## 2019-02-16 ENCOUNTER — Other Ambulatory Visit: Payer: Self-pay

## 2019-02-16 ENCOUNTER — Encounter: Payer: Self-pay | Admitting: Family Medicine

## 2019-02-16 VITALS — BP 171/72 | HR 75 | Temp 98.6°F | Ht 68.0 in | Wt 146.0 lb

## 2019-02-16 DIAGNOSIS — E538 Deficiency of other specified B group vitamins: Secondary | ICD-10-CM

## 2019-02-16 DIAGNOSIS — D509 Iron deficiency anemia, unspecified: Secondary | ICD-10-CM

## 2019-02-16 DIAGNOSIS — F5104 Psychophysiologic insomnia: Secondary | ICD-10-CM

## 2019-02-16 DIAGNOSIS — L84 Corns and callosities: Secondary | ICD-10-CM | POA: Diagnosis not present

## 2019-02-16 DIAGNOSIS — C61 Malignant neoplasm of prostate: Secondary | ICD-10-CM

## 2019-02-16 DIAGNOSIS — I10 Essential (primary) hypertension: Secondary | ICD-10-CM

## 2019-02-16 DIAGNOSIS — E559 Vitamin D deficiency, unspecified: Secondary | ICD-10-CM

## 2019-02-16 DIAGNOSIS — E782 Mixed hyperlipidemia: Secondary | ICD-10-CM

## 2019-02-16 LAB — LIPID PANEL

## 2019-02-16 MED ORDER — ALPRAZOLAM 0.5 MG PO TABS: ORAL_TABLET | ORAL | 5 refills | Status: DC

## 2019-02-16 MED ORDER — HYDROCHLOROTHIAZIDE 25 MG PO TABS: 25.0000 mg | ORAL_TABLET | Freq: Every day | ORAL | 3 refills | Status: DC

## 2019-02-16 NOTE — Progress Notes (Signed)
6/10/20204:21 PM  Joaquin [birth year], 73 y.o., male [id number]  Chief Complaint  Patient presents with  . Hypertension    has not had bp med since this past Sunday. Did blood draw this morn. Also, has a corn on right foot that is starting be become uncomfortable. Attempt to freeze off and he has cut it off himself 2x  . Medication Refill    on all meds    HPI:   Patient is a 73 y.o. male with past medical history significant for HTN, HLP, anemia iron and b12, recent diagnosis of prostate cancer who presents today for routine followup  Last OV Feb 2020 D/c iron supplementation as anemia resolved Very stressed due to covid, being quarantined, moving to a smaller house, requesting refill of xanax Sees urology in July Having issues with with persistent corn on right foot, medial side of 2nd toe, has been shaved and frozen but still comes back Walks 2 miles a day plus has been very active with the move, has lost some weight Stopped iron as discussed Ran out of BP meds about a week ago  CBC Latest Ref Rng & Units 11/02/2018 07/26/2018 07/25/2017  WBC 3.4 - 10.8 x10E3/uL 5.8 6.9 8.5  Hemoglobin 13.0 - 17.7 g/dL 13.8 10.7(L) 12.7(A)  Hematocrit 37.5 - 51.0 % 42.3 36.3(L) 39.0(A)  Platelets 150 - 450 x10E3/uL 224 353 -   Lab Results  Component Value Date   IRON 159 11/02/2018   TIBC 334 11/02/2018   FERRITIN 49 11/02/2018   Lab Results  Component Value Date   VITAMINB12 652 07/26/2018    Fall Risk  02/16/2019 11/19/2018 10/19/2018 07/26/2018 02/16/2018  Falls in the past year? 0 0 0 1 No  Number falls in past yr: 0 - 0 1 -  Injury with Fall? 0 - 0 0 -  Follow up - Falls evaluation completed - - -     Depression screen [facility] 2/9 02/16/2019 11/19/2018 10/19/2018  Decreased Interest 0 0 0  Down, Depressed, Hopeless 0 0 0  PHQ - 2 Score 0 0 0    Allergies  Allergen Reactions  . Bupropion Hcl   . Shrimp [Shellfish Allergy]     Prior to Admission medications    Medication Sig Start Date End Date Taking? Authorizing Provider  ALPRAZolam Duanne Moron) 0.5 MG tablet TAKE 2 TABLETS(1 MG) BY MOUTH AT BEDTIME AS NEEDED FOR SLEEP 08/16/18  Yes Shawnee Knapp, MD  aspirin 81 MG tablet Take 81 mg by mouth daily.   Yes [provider]  augmented betamethasone dipropionate (DIPROLENE-AF) 0.05 % cream Apply topically 2 (two) times daily. To pruritic papules on back 07/26/18  Yes Shawnee Knapp, MD  cyanocobalamin (,VITAMIN B-12,) 1000 MCG/ML injection Inject 1 mL every 14 days 02/16/18  Yes Shawnee Knapp, MD  hydrochlorothiazide (HYDRODIURIL) 25 MG tablet Take 1 tablet (25 mg total) by mouth daily. 02/16/18  Yes Shawnee Knapp, MD    Past Medical History:  Diagnosis Date  . Anemia   . Arthritis   . Depression   . Prostate cancer ([place]) 10/14/2018    Past Surgical History:  Procedure Laterality Date  . CHOLECYSTECTOMY      Social History   Tobacco Use  . Smoking status: Current Some Day Smoker    Types: Cigarettes    Last attempt to quit: 11/05/2003    Years since quitting: 15.2  . Smokeless tobacco: Never Used  Substance Use Topics  . Alcohol use: Not  on file    Family History  Problem Relation Age of Onset  . Heart disease Father   . Diabetes Father   . Cancer Father     Review of Systems  Constitutional: Negative for chills and fever.  Respiratory: Negative for cough and shortness of breath.   Cardiovascular: Negative for chest pain, palpitations and leg swelling.  Gastrointestinal: Negative for abdominal pain, constipation, diarrhea, nausea and vomiting.   Per hpi  OBJECTIVE:  Today's Vitals   02/16/19 1612  BP: (!) 171/72  Pulse: 75  Temp: 98.6 F (37 C)  TempSrc: Oral  SpO2: 97%  Weight: 146 lb (66.2 kg)  Height: 5\' 8"  (1.727 m)   Body mass index is 22.2 kg/m.   Wt Readings from Last 3 Encounters:  02/16/19 146 lb (66.2 kg)  11/19/18 154 lb 6.4 oz (70 kg)  11/02/18 154 lb 12.8 oz (70.2 kg)    BP Readings from Last 3  Encounters:  02/16/19 (!) 171/72  11/19/18 116/70  11/02/18 132/86   Physical Exam Vitals signs and nursing note reviewed.  Constitutional:      Appearance: He is well-developed.  HENT:     Head: Normocephalic and atraumatic.  Eyes:     Conjunctiva/sclera: Conjunctivae normal.     Pupils: Pupils are equal, round, and reactive to light.  Neck:     Musculoskeletal: Neck supple.  Cardiovascular:     Rate and Rhythm: Normal rate and regular rhythm.     Heart sounds: No murmur. No friction rub. No gallop.   Pulmonary:     Effort: Pulmonary effort is normal.     Breath sounds: Normal breath sounds. No wheezing or rales.  Skin:    General: Skin is warm and dry.  Neurological:     Mental Status: He is alert and oriented to person, place, and time.     ASSESSMENT and PLAN  1. Essential hypertension Not controlled today in setting of being off BP meds, recent BP at goal. Cont current regime.  2. Corn of toe - Ambulatory referral to Podiatry  3. Iron deficiency anemia, unspecified iron deficiency anemia type Resolved. Rechecking labs now that he is off supplementation.  4. Prostate cancer [facility]) Managed by urology  5. Vitamin B12 deficiency At goal on supplementation.  6.  Psychophysiological insomnia Other orders pmp reviewed. Takes prn, reviewed r/se/b.  - hydrochlorothiazide (HYDRODIURIL) 25 MG tablet; Take 1 tablet (25 mg total) by mouth daily. - ALPRAZolam (XANAX) 0.5 MG tablet; TAKE 2 TABLETS(1 MG) BY MOUTH AT BEDTIME AS NEEDED FOR SLEEP  Return in about 6 months (around 08/18/2019).    Rutherford Guys, MD Primary Care at [place], [place] [address] Ph.  [phone] Fax [phone]

## 2019-02-16 NOTE — Patient Instructions (Signed)
° ° ° °  If you have lab work done today you will be contacted with your lab results within the next 2 weeks.  If you have not heard from us then please contact us. The fastest way to get your results is to register for My Chart. ° ° °IF you received an x-ray today, you will receive an invoice from [facility]. Please contact [facility] at [phone] with questions or concerns regarding your invoice.  ° °IF you received labwork today, you will receive an invoice from [facility]. Please contact [facility] at [phone] with questions or concerns regarding your invoice.  ° °Our billing staff will not be able to assist you with questions regarding bills from these companies. ° °You will be contacted with the lab results as soon as they are available. The fastest way to get your results is to activate your My Chart account. Instructions are located on the last page of this paperwork. If you have not heard from us regarding the results in 2 weeks, please contact this office. °  ° ° ° °

## 2019-02-17 LAB — LIPID PANEL
Chol/HDL Ratio: 4 ratio (ref 0.0–5.0)
Cholesterol, Total: 165 mg/dL (ref 100–199)
HDL: 41 mg/dL (ref >39)
LDL Calculated: 110 mg/dL — ABNORMAL HIGH (ref 0–99)
Triglycerides: 70 mg/dL (ref 0–149)
VLDL Cholesterol Cal: 14 mg/dL (ref 5–40)

## 2019-02-17 LAB — COMPREHENSIVE METABOLIC PANEL
ALT: 17 IU/L (ref 0–44)
AST: 20 IU/L (ref 0–40)
Albumin/Globulin Ratio: 1.7 (ref 1.2–2.2)
Albumin: 3.8 g/dL (ref 3.7–4.7)
Alkaline Phosphatase: 71 IU/L (ref 39–117)
BUN/Creatinine Ratio: 13 (ref 10–24)
BUN: 10 mg/dL (ref 8–27)
Bilirubin Total: 0.6 mg/dL (ref 0.0–1.2)
CO2: 21 mmol/L (ref 20–29)
Calcium: 8.7 mg/dL (ref 8.6–10.2)
Chloride: 105 mmol/L (ref 96–106)
Creatinine, Ser: 0.79 mg/dL (ref 0.76–1.27)
GFR calc Af Amer: 103 mL/min/{1.73_m2} (ref >59)
GFR calc non Af Amer: 89 mL/min/{1.73_m2} (ref >59)
Globulin, Total: 2.2 g/dL (ref 1.5–4.5)
Glucose: 91 mg/dL (ref 65–99)
Potassium: 4.7 mmol/L (ref 3.5–5.2)
Sodium: 140 mmol/L (ref 134–144)
Total Protein: 6 g/dL (ref 6.0–8.5)

## 2019-02-17 LAB — CBC WITH DIFFERENTIAL/PLATELET
Basophils Absolute: 0 10*3/uL (ref 0.0–0.2)
Basos: 1 %
EOS (ABSOLUTE): 0.1 10*3/uL (ref 0.0–0.4)
Eos: 1 %
Hematocrit: 39.9 % (ref 37.5–51.0)
Hemoglobin: 13.3 g/dL (ref 13.0–17.7)
Immature Grans (Abs): 0 10*3/uL (ref 0.0–0.1)
Immature Granulocytes: 0 %
Lymphocytes Absolute: 1.4 10*3/uL (ref 0.7–3.1)
Lymphs: 18 %
MCH: 30 pg (ref 26.6–33.0)
MCHC: 33.3 g/dL (ref 31.5–35.7)
MCV: 90 fL (ref 79–97)
Monocytes Absolute: 0.5 10*3/uL (ref 0.1–0.9)
Monocytes: 7 %
Neutrophils Absolute: 5.9 10*3/uL (ref 1.4–7.0)
Neutrophils: 73 %
Platelets: 260 10*3/uL (ref 150–450)
RBC: 4.43 x10E6/uL (ref 4.14–5.80)
RDW: 13.1 % (ref 11.6–15.4)
WBC: 7.9 10*3/uL (ref 3.4–10.8)

## 2019-02-17 LAB — VITAMIN B12: Vitamin B-12: 702 pg/mL (ref 232–1245)

## 2019-02-17 LAB — VITAMIN D 25 HYDROXY (VIT D DEFICIENCY, FRACTURES): Vit D, 25-Hydroxy: 24.8 ng/mL — ABNORMAL LOW (ref 30.0–100.0)

## 2019-02-17 LAB — IRON,TIBC AND FERRITIN PANEL
Ferritin: 23 ng/mL — ABNORMAL LOW (ref 30–400)
Iron Saturation: 39 % (ref 15–55)
Iron: 123 ug/dL (ref 38–169)
Total Iron Binding Capacity: 315 ug/dL (ref 250–450)
UIBC: 192 ug/dL (ref 111–343)

## 2019-03-31 DIAGNOSIS — C61 Malignant neoplasm of prostate: Secondary | ICD-10-CM | POA: Diagnosis not present

## 2019-04-02 ENCOUNTER — Telehealth: Payer: Self-pay | Admitting: Physician Assistant

## 2019-04-02 DIAGNOSIS — Z20822 Contact with and (suspected) exposure to covid-19: Secondary | ICD-10-CM

## 2019-04-02 NOTE — Progress Notes (Signed)
I have spent 5 minutes in review of e-visit questionnaire, review and updating patient chart, medical decision making and response to patient.   Jinx Gilden Cody Ramonda Galyon, PA-C    

## 2019-04-02 NOTE — Progress Notes (Signed)
E-Visit for Corona Virus Screening   Your current symptoms could be consistent with the coronavirus.  Many health care providers can now test patients at their office but not all are.  [facility] has multiple testing sites. For information on our COVID testing locations and hours go to [email or web address]  Please quarantine yourself while awaiting your test results.  We are enrolling you in our [facility] for [facility] . Daily you will receive a questionnaire within the [facility] website. Our COVID 19 response team willl be monitoriing your responses daily.  I have placed an order for testing at [facility] sites for you.   COVID-19 is a respiratory illness with symptoms that are similar to the flu. Symptoms are typically mild to moderate, but there have been cases of severe illness and death due to the virus. The following symptoms may appear 2-14 days after exposure: . Fever . Cough . Shortness of breath or difficulty breathing . Chills . Repeated shaking with chills . Muscle pain . Headache . Sore throat . New loss of taste or smell . Fatigue . Congestion or runny nose . Nausea or vomiting . Diarrhea  It is vitally important that if you feel that you have an infection such as this virus or any other virus that you stay home and away from places where you may spread it to others.  You should self-quarantine for 14 days if you have symptoms that could potentially be coronavirus or have been in close contact a with a person diagnosed with COVID-19 within the last 2 weeks. You should avoid contact with people age 79 and older.   You should wear a mask or cloth face covering over your nose and mouth if you must be around other people or animals, including pets (even at home). Try to stay at least 6 feet away from other people. This will protect the people around you.  You may also take acetaminophen (Tylenol) as needed for fever.   Reduce your risk of any  infection by using the same precautions used for avoiding the common cold or flu:  Marland Kitchen Wash your hands often with soap and warm water for at least 20 seconds.  If soap and water are not readily available, use an alcohol-based hand sanitizer with at least 60% alcohol.  . If coughing or sneezing, cover your mouth and nose by coughing or sneezing into the elbow areas of your shirt or coat, into a tissue or into your sleeve (not your hands). . Avoid shaking hands with others and consider head nods or verbal greetings only. . Avoid touching your eyes, nose, or mouth with unwashed hands.  . Avoid close contact with people who are sick. . Avoid places or events with large numbers of people in one location, like concerts or sporting events. . Carefully consider travel plans you have or are making. . If you are planning any travel outside or inside the [place], visit the CDC's Travelers' Health webpage for the latest health notices. . If you have some symptoms but not all symptoms, continue to monitor at home and seek medical attention if your symptoms worsen. . If you are having a medical emergency, call 911.  HOME CARE . Only take medications as instructed by your medical team. . Drink plenty of fluids and get plenty of rest. . A steam or ultrasonic humidifier can help if you have congestion.   GET HELP RIGHT AWAY IF YOU HAVE EMERGENCY WARNING SIGNS** FOR COVID-19. If you or someone  is showing any of these signs seek emergency medical care immediately. Call 911 or proceed to your closest emergency facility if: . You develop worsening high fever. . Trouble breathing . Bluish lips or face . Persistent pain or pressure in the chest . New confusion . Inability to wake or stay awake . You cough up blood. . Your symptoms become more severe  **This list is not all possible symptoms. Contact your medical provider for any symptoms that are sever or concerning to you.   MAKE SURE YOU   Understand these  instructions.  Will watch your condition.  Will get help right away if you are not doing well or get worse.  Your e-visit answers were reviewed by a board certified advanced clinical practitioner to complete your personal care plan.  Depending on the condition, your plan could have included both over the counter or prescription medications.  If there is a problem please reply once you have received a response from your provider.  Your safety is important to [place].  If you have drug allergies check your prescription carefully.    You can use MyChart to ask questions about today's visit, request a non-urgent call back, or ask for a work or school excuse for 24 hours related to this e-Visit. If it has been greater than 24 hours you will need to follow up with your provider, or enter a new e-Visit to address those concerns. You will get an e-mail in the next two days asking about your experience.  I hope that your e-visit has been valuable and will speed your recovery. Thank you for using e-visits.

## 2019-04-03 ENCOUNTER — Encounter (INDEPENDENT_AMBULATORY_CARE_PROVIDER_SITE_OTHER): Payer: Self-pay

## 2019-04-04 DIAGNOSIS — Z03818 Encounter for observation for suspected exposure to other biological agents ruled out: Secondary | ICD-10-CM | POA: Diagnosis not present

## 2019-04-08 ENCOUNTER — Encounter: Payer: Self-pay | Admitting: Family Medicine

## 2019-04-12 ENCOUNTER — Other Ambulatory Visit: Payer: Self-pay

## 2019-04-12 ENCOUNTER — Encounter: Payer: Self-pay | Admitting: Family Medicine

## 2019-04-12 DIAGNOSIS — E538 Deficiency of other specified B group vitamins: Secondary | ICD-10-CM

## 2019-04-12 MED ORDER — CYANOCOBALAMIN 1000 MCG/ML IJ SOLN: INTRAMUSCULAR | 3 refills | Status: DC

## 2019-04-29 ENCOUNTER — Ambulatory Visit (INDEPENDENT_AMBULATORY_CARE_PROVIDER_SITE_OTHER): Payer: Medicare Other | Admitting: Family Medicine

## 2019-04-29 ENCOUNTER — Other Ambulatory Visit: Payer: Self-pay

## 2019-04-29 DIAGNOSIS — Z23 Encounter for immunization: Secondary | ICD-10-CM | POA: Diagnosis not present

## 2019-06-10 ENCOUNTER — Encounter: Payer: Self-pay | Admitting: Gastroenterology

## 2019-08-15 ENCOUNTER — Other Ambulatory Visit: Payer: Self-pay

## 2019-08-15 ENCOUNTER — Ambulatory Visit (INDEPENDENT_AMBULATORY_CARE_PROVIDER_SITE_OTHER): Payer: Medicare Other | Admitting: Family Medicine

## 2019-08-15 ENCOUNTER — Encounter: Payer: Self-pay | Admitting: Family Medicine

## 2019-08-15 VITALS — BP 120/80 | HR 74 | Temp 98.1°F | Ht 68.0 in | Wt 150.8 lb

## 2019-08-15 DIAGNOSIS — E538 Deficiency of other specified B group vitamins: Secondary | ICD-10-CM | POA: Diagnosis not present

## 2019-08-15 DIAGNOSIS — I10 Essential (primary) hypertension: Secondary | ICD-10-CM | POA: Diagnosis not present

## 2019-08-15 DIAGNOSIS — D509 Iron deficiency anemia, unspecified: Secondary | ICD-10-CM

## 2019-08-15 DIAGNOSIS — Z8249 Family history of ischemic heart disease and other diseases of the circulatory system: Secondary | ICD-10-CM

## 2019-08-15 DIAGNOSIS — C61 Malignant neoplasm of prostate: Secondary | ICD-10-CM

## 2019-08-15 DIAGNOSIS — F5104 Psychophysiologic insomnia: Secondary | ICD-10-CM

## 2019-08-15 DIAGNOSIS — E782 Mixed hyperlipidemia: Secondary | ICD-10-CM

## 2019-08-15 DIAGNOSIS — E559 Vitamin D deficiency, unspecified: Secondary | ICD-10-CM

## 2019-08-15 MED ORDER — "SYRINGE 25G X 1"" 3 ML MISC": 0 refills | Status: DC

## 2019-08-15 MED ORDER — HYDROCHLOROTHIAZIDE 25 MG PO TABS: 25.0000 mg | ORAL_TABLET | Freq: Every day | ORAL | 3 refills | Status: DC

## 2019-08-15 MED ORDER — CYANOCOBALAMIN 1000 MCG/ML IJ SOLN: INTRAMUSCULAR | 3 refills | Status: DC

## 2019-08-15 MED ORDER — ALPRAZOLAM 0.5 MG PO TABS: ORAL_TABLET | ORAL | 5 refills | Status: DC

## 2019-08-15 NOTE — Progress Notes (Signed)
12/7/20208:18 AM  Jackson Perry 06-12-1946, 73 y.o., male [id number]  Chief Complaint  Patient presents with  . Follow-up    6 month follow  up. Received cologuard from ins company, has yet to complete. Wants hard copy of rx to shop for best prices    HPI:   Patient is a 73 y.o. male with past medical history significant for HTN, HLP, b12, iron and D3 deficiency, prostate cancer who presents today for routine followup  Last OV June 2020 Overall doing well Excited about being a grandfather Has been taking vitamin D 1000 Does b12 injections monthly at home Saw urology in July, had PSA checked, recommendation was for ct staging and radiation, patient declined due to covid, fu feb 2021 pmp reviewed, takes xanax for sleep prn Walks 2-4 miles daily Continues to smoke about 5 cig a day Worried due to recent brother and cousins have had CAD/stents/MI Takes ASA daily Fasting today   Depression screen [facility] 2/9 08/15/2019 02/16/2019 11/19/2018  Decreased Interest 0 0 0  Down, Depressed, Hopeless 0 0 0  PHQ - 2 Score 0 0 0    Fall Risk  08/15/2019 02/16/2019 11/19/2018 10/19/2018 07/26/2018  Falls in the past year? 0 0 0 0 1  Number falls in past yr: 0 0 - 0 1  Injury with Fall? 0 0 - 0 0  Follow up - - Falls evaluation completed - -     Allergies  Allergen Reactions  . Bupropion Hcl   . Shrimp [Shellfish Allergy]     Prior to Admission medications   Medication Sig Start Date End Date Taking? Authorizing Provider  ALPRAZolam (XANAX) 0.5 MG tablet TAKE 2 TABLETS(1 MG) BY MOUTH AT BEDTIME AS NEEDED FOR SLEEP 02/16/19  Yes Rutherford Guys, MD  aspirin 81 MG tablet Take 81 mg by mouth daily.   Yes [provider]  augmented betamethasone dipropionate (DIPROLENE-AF) 0.05 % cream Apply topically 2 (two) times daily. To pruritic papules on back 07/26/18  Yes Shawnee Knapp, MD  cyanocobalamin (,VITAMIN B-12,) 1000 MCG/ML injection Inject 1 mL every 14 days 04/12/19  Yes Rutherford Guys, MD  hydrochlorothiazide (HYDRODIURIL) 25 MG tablet Take 1 tablet (25 mg total) by mouth daily. 02/16/19  Yes Rutherford Guys, MD    Past Medical History:  Diagnosis Date  . Anemia   . Arthritis   . Depression   . Prostate cancer ([place]) 10/14/2018    Past Surgical History:  Procedure Laterality Date  . CHOLECYSTECTOMY      Social History   Tobacco Use  . Smoking status: Current Some Day Smoker    Types: Cigarettes    Last attempt to quit: 11/05/2003    Years since quitting: 15.7  . Smokeless tobacco: Never Used  Substance Use Topics  . Alcohol use: Not on file    Family History  Problem Relation Age of Onset  . Heart disease Father   . Diabetes Father   . Cancer Father     Review of Systems  Constitutional: Negative for chills and fever.  Respiratory: Negative for cough and shortness of breath.   Cardiovascular: Negative for chest pain, palpitations, claudication and leg swelling.  Gastrointestinal: Negative for abdominal pain, nausea and vomiting.     OBJECTIVE:  Today's Vitals   08/15/19 0809  BP: 120/80  Pulse: 74  Temp: 98.1 F (36.7 C)  SpO2: 97%  Weight: 150 lb 12.8 oz (68.4 kg)  Height: 5\' 8"  (1.727 m)  Body mass index is 22.93 kg/m.   Physical Exam Vitals signs and nursing note reviewed.  Constitutional:      Appearance: He is well-developed.  HENT:     Head: Normocephalic and atraumatic.  Eyes:     Conjunctiva/sclera: Conjunctivae normal.     Pupils: Pupils are equal, round, and reactive to light.  Neck:     Musculoskeletal: Neck supple.  Cardiovascular:     Rate and Rhythm: Normal rate and regular rhythm.     Heart sounds: No murmur. No friction rub. No gallop.   Pulmonary:     Effort: Pulmonary effort is normal.     Breath sounds: Normal breath sounds. No wheezing or rales.  Musculoskeletal:     Right lower leg: No edema.     Left lower leg: No edema.  Skin:    General: Skin is warm and dry.  Neurological:     Mental  Status: He is alert and oriented to person, place, and time.     No results found for this or any previous visit (from the past 24 hour(s)).  No results found.   ASSESSMENT and PLAN  1. Vitamin B12 deficiency Checking labs today, medications will be adjusted as needed.  - Vit B 12 - cyanocobalamin (,VITAMIN B-12,) 1000 MCG/ML injection; Inject 1 mL every 14 days  2. Iron deficiency anemia, unspecified iron deficiency anemia type Checking labs today, medications will be adjusted as needed.  - CBC  3. Essential hypertension Controlled. Continue current regime.  - Lipid panel - CMET with GFR - hydrochlorothiazide (HYDRODIURIL) 25 MG tablet; Take 1 tablet (25 mg total) by mouth daily.  4. Mixed hyperlipidemia Labs pending, cont with LFM - Lipid panel - CMET with GFR  5. Vitamin D deficiency Checking labs today, medications will be adjusted as needed.  - Vit D  25 hydroxy (rtn osteoporosis monitoring)  6. Psychophysiological insomnia - ALPRAZolam (XANAX) 0.5 MG tablet; TAKE 2 TABLETS(1 MG) BY MOUTH AT BEDTIME AS NEEDED FOR SLEEP  7. Family history of heart disease Discussed importance of smoking cessation, not ready to quit - Ambulatory referral to Cardiology  8. Prostate cancer ([place]) Intermediate risk, active surveillance per patient's choice, managed by urology  Other orders - Syringe/Needle, Disp, (SYRINGE 3CC/25GX1") 25G X 1" 3 ML MISC; Use monthly for b12 injections  Return in about 6 months (around 02/13/2020).    Rutherford Guys, MD Primary Care at [place], [place] [address] Ph.  [phone] Fax [phone]

## 2019-08-15 NOTE — Patient Instructions (Signed)
° ° ° °  If you have lab work done today you will be contacted with your lab results within the next 2 weeks.  If you have not heard from us then please contact us. The fastest way to get your results is to register for My Chart. ° ° °IF you received an x-ray today, you will receive an invoice from [facility]. Please contact [facility] at [phone] with questions or concerns regarding your invoice.  ° °IF you received labwork today, you will receive an invoice from [facility]. Please contact [facility] at [phone] with questions or concerns regarding your invoice.  ° °Our billing staff will not be able to assist you with questions regarding bills from these companies. ° °You will be contacted with the lab results as soon as they are available. The fastest way to get your results is to activate your My Chart account. Instructions are located on the last page of this paperwork. If you have not heard from us regarding the results in 2 weeks, please contact this office. °  ° ° ° °

## 2019-08-16 LAB — CBC
Hematocrit: 40.9 % (ref 37.5–51.0)
Hemoglobin: 13.1 g/dL (ref 13.0–17.7)
MCH: 29.1 pg (ref 26.6–33.0)
MCHC: 32 g/dL (ref 31.5–35.7)
MCV: 91 fL (ref 79–97)
Platelets: 253 10*3/uL (ref 150–450)
RBC: 4.5 x10E6/uL (ref 4.14–5.80)
RDW: 13.7 % (ref 11.6–15.4)
WBC: 9.1 10*3/uL (ref 3.4–10.8)

## 2019-08-16 LAB — CMP14+EGFR
ALT: 13 IU/L (ref 0–44)
AST: 19 IU/L (ref 0–40)
Albumin/Globulin Ratio: 1.9 (ref 1.2–2.2)
Albumin: 4.1 g/dL (ref 3.7–4.7)
Alkaline Phosphatase: 72 IU/L (ref 39–117)
BUN/Creatinine Ratio: 14 (ref 10–24)
BUN: 14 mg/dL (ref 8–27)
Bilirubin Total: 0.3 mg/dL (ref 0.0–1.2)
CO2: 24 mmol/L (ref 20–29)
Calcium: 8.8 mg/dL (ref 8.6–10.2)
Chloride: 100 mmol/L (ref 96–106)
Creatinine, Ser: 0.98 mg/dL (ref 0.76–1.27)
GFR calc Af Amer: 87 mL/min/{1.73_m2} (ref >59)
GFR calc non Af Amer: 76 mL/min/{1.73_m2} (ref >59)
Globulin, Total: 2.2 g/dL (ref 1.5–4.5)
Glucose: 87 mg/dL (ref 65–99)
Potassium: 4 mmol/L (ref 3.5–5.2)
Sodium: 137 mmol/L (ref 134–144)
Total Protein: 6.3 g/dL (ref 6.0–8.5)

## 2019-08-16 LAB — LIPID PANEL
Chol/HDL Ratio: 4.5 ratio (ref 0.0–5.0)
Cholesterol, Total: 177 mg/dL (ref 100–199)
HDL: 39 mg/dL — ABNORMAL LOW (ref >39)
LDL Chol Calc (NIH): 125 mg/dL — ABNORMAL HIGH (ref 0–99)
Triglycerides: 71 mg/dL (ref 0–149)
VLDL Cholesterol Cal: 13 mg/dL (ref 5–40)

## 2019-08-16 LAB — VITAMIN D 25 HYDROXY (VIT D DEFICIENCY, FRACTURES): Vit D, 25-Hydroxy: 49.9 ng/mL (ref 30.0–100.0)

## 2019-08-16 LAB — VITAMIN B12: Vitamin B-12: 640 pg/mL (ref 232–1245)

## 2019-08-22 MED ORDER — ATORVASTATIN CALCIUM 20 MG PO TABS: 20.0000 mg | ORAL_TABLET | Freq: Every day | ORAL | 3 refills | Status: DC

## 2019-08-22 NOTE — Addendum Note (Signed)
Addended by: Rutherford Guys on: 08/22/2019 01:03 PM   Modules accepted: Orders

## 2019-09-06 ENCOUNTER — Other Ambulatory Visit: Payer: Self-pay

## 2019-09-06 ENCOUNTER — Ambulatory Visit (INDEPENDENT_AMBULATORY_CARE_PROVIDER_SITE_OTHER): Payer: Medicare Other | Admitting: Cardiovascular Disease

## 2019-09-06 ENCOUNTER — Encounter: Payer: Self-pay | Admitting: Cardiovascular Disease

## 2019-09-06 DIAGNOSIS — Z720 Tobacco use: Secondary | ICD-10-CM

## 2019-09-06 DIAGNOSIS — Z8249 Family history of ischemic heart disease and other diseases of the circulatory system: Secondary | ICD-10-CM | POA: Diagnosis not present

## 2019-09-06 DIAGNOSIS — R072 Precordial pain: Secondary | ICD-10-CM | POA: Diagnosis not present

## 2019-09-06 DIAGNOSIS — E782 Mixed hyperlipidemia: Secondary | ICD-10-CM

## 2019-09-06 NOTE — Progress Notes (Signed)
09/06/2019 Broderic Cullimore Uhlig   1945-12-09  [id number]  Primary Physician Rutherford Guys, MD Primary Cardiologist: Lorretta Harp MD Lupe Carney, [place]  HPI:  Jackson Perry is a 73 y.o. fit-appearing married Caucasian male father of 1 child, soon-to-be grandfather of 1 grandchild referred by Dr. Pamella Pert for cardiovascular valuation because of risk factors.  He is retired from working in the [employer].  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  He smokes 4 to 5 cigarettes a day, drinks 2 drinks a day either vodka or gin on the rocks.  Does have treated hypertension hyperlipidemia.  He is not diabetic.  Her brother recently had a stent.  He is never had a heart attack or stroke.  He denies chest pain or shortness of breath.  He is fairly active and walks 2.5 miles a day without limitation.   Current Meds  Medication Sig  . ALPRAZolam (XANAX) 0.5 MG tablet TAKE 2 TABLETS(1 MG) BY MOUTH AT BEDTIME AS NEEDED FOR SLEEP  . aspirin 81 MG tablet Take 81 mg by mouth daily.  Marland Kitchen atorvastatin (LIPITOR) 20 MG tablet Take 1 tablet (20 mg total) by mouth daily.  Marland Kitchen augmented betamethasone dipropionate (DIPROLENE-AF) 0.05 % cream Apply topically 2 (two) times daily. To pruritic papules on back  . cyanocobalamin (,VITAMIN B-12,) 1000 MCG/ML injection Inject 1 mL every 14 days  . hydrochlorothiazide (HYDRODIURIL) 25 MG tablet Take 1 tablet (25 mg total) by mouth daily.  . Syringe/Needle, Disp, (SYRINGE 3CC/25GX1") 25G X 1" 3 ML MISC Use monthly for b12 injections     Allergies  Allergen Reactions  . Bupropion Hcl     Social History   Socioeconomic History  . Marital status: Married    Spouse name: Not on file  . Number of children: Not on file  . Years of education: Not on file  . Highest education level: Not on file  Occupational History  . Not on file  Tobacco Use  . Smoking status: Current Some Day Smoker    Types: Cigarettes    Last  attempt to quit: 11/05/2003    Years since quitting: 15.8  . Smokeless tobacco: Never Used  Substance and Sexual Activity  . Alcohol use: Not on file  . Drug use: Not on file  . Sexual activity: Not on file  Other Topics Concern  . Not on file  Social History Narrative  . Not on file   Social Determinants of Health   Financial Resource Strain:   . Difficulty of Paying Living Expenses: Not on file  Food Insecurity:   . Worried About Charity fundraiser in the Last Year: Not on file  . Ran Out of Food in the Last Year: Not on file  Transportation Needs:   . Lack of Transportation (Medical): Not on file  . Lack of Transportation (Non-Medical): Not on file  Physical Activity:   . Days of Exercise per Week: Not on file  . Minutes of Exercise per Session: Not on file  Stress:   . Feeling of Stress : Not on file  Social Connections:   . Frequency of Communication with Friends and Family: Not on file  . Frequency of Social Gatherings with Friends and Family: Not on file  . Attends Religious Services: Not on file  . Active Member of Clubs or Organizations: Not on file  . Attends Archivist Meetings: Not on file  . Marital Status:  Not on file  Intimate Partner Violence:   . Fear of Current or Ex-Partner: Not on file  . Emotionally Abused: Not on file  . Physically Abused: Not on file  . Sexually Abused: Not on file     Review of Systems: General: negative for chills, fever, night sweats or weight changes.  Cardiovascular: negative for chest pain, dyspnea on exertion, edema, orthopnea, palpitations, paroxysmal nocturnal dyspnea or shortness of breath Dermatological: negative for rash Respiratory: negative for cough or wheezing Urologic: negative for hematuria Abdominal: negative for nausea, vomiting, diarrhea, bright red blood per rectum, melena, or hematemesis Neurologic: negative for visual changes, syncope, or dizziness All other systems reviewed and are otherwise  negative except as noted above.    Blood pressure (!) 152/87, pulse 64, temperature 97.7 F (36.5 C), height 5\' 8"  (1.727 m), weight 150 lb 9.6 oz (68.3 kg), SpO2 97 %.  General appearance: alert and no distress Neck: no adenopathy, no carotid bruit, no JVD, supple, symmetrical, trachea midline and thyroid not enlarged, symmetric, no tenderness/mass/nodules Lungs: clear to auscultation bilaterally Heart: regular rate and rhythm, S1, S2 normal, no murmur, click, rub or gallop Extremities: extremities normal, atraumatic, no cyanosis or edema Pulses: 2+ and symmetric Skin: Skin color, texture, turgor normal. No rashes or lesions Neurologic: Alert and oriented X 3, normal strength and tone. Normal symmetric reflexes. Normal coordination and gait  EKG sinus rhythm at 65 with first-degree AV block.  I personally reviewed this EKG.  ASSESSMENT AND PLAN:   Family history of heart disease Brother recently had a stent  Tobacco abuse Smokes 4 to 5 cigarettes a day.  Hyperlipidemia History of hyperlipidemia on statin therapy with recent lipid profile performed 08/15/2019 revealing cholesterol of 77, LDL 110 and HDL 39.  Essential hypertension History of essential pretension blood pressure measured today 152/87.  He is on HydroDIURIL      Lorretta Harp MD FACP,FACC,FAHA, [facility] 09/06/2019 11:39 AM

## 2019-09-06 NOTE — Assessment & Plan Note (Signed)
Smokes 4 to 5 cigarettes a day.

## 2019-09-06 NOTE — Assessment & Plan Note (Signed)
Brother recently had a stent

## 2019-09-06 NOTE — Assessment & Plan Note (Signed)
History of hyperlipidemia on statin therapy with recent lipid profile performed 08/15/2019 revealing cholesterol of 77, LDL 110 and HDL 39.

## 2019-09-06 NOTE — Patient Instructions (Signed)
Medication Instructions:  Your physician recommends that you continue on your current medications as directed. Please refer to the Current Medication list given to you today.  If you need a refill on your cardiac medications before your next appointment, please call your pharmacy.   Lab work: NONE  Testing/Procedures: Coronary Calcium Score  Follow-Up: At [facility], you and your health needs are our priority.  As part of our continuing mission to provide you with exceptional heart care, we have created designated Provider Care Teams.  These Care Teams include your primary Cardiologist (physician) and Advanced Practice Providers (APPs -  Physician Assistants and Nurse Practitioners) who all work together to provide you with the care you need, when you need it. You may see Dr Gwenlyn Found or one of the following Advanced Practice Providers on your designated Care Team:    Kerin Ransom, PA-C  [place], [place]  Coletta Memos, [place]  Your physician wants you to follow-up in: 1 year unless test abnormal. You will be contacted  Any Other Special Instructions Will Be Listed Below (If Applicable).   Coronary Calcium Scan A coronary calcium scan is an imaging test used to look for deposits of calcium and other fatty materials (plaques) in the inner lining of the blood vessels of the heart (coronary arteries). These deposits of calcium and plaques can partly clog and narrow the coronary arteries without producing any symptoms or warning signs. This puts a person at risk for a heart attack. This test can detect these deposits before symptoms develop. Tell a health care provider about:  Any allergies you have.  All medicines you are taking, including vitamins, herbs, eye drops, creams, and over-the-counter medicines.  Any problems you or family members have had with anesthetic medicines.  Any blood disorders you have.  Any surgeries you have had.  Any medical conditions you  have.  Whether you are pregnant or may be pregnant. What are the risks? Generally, this is a safe procedure. However, problems may occur, including:  Harm to a pregnant woman and her unborn baby. This test involves the use of radiation. Radiation exposure can be dangerous to a pregnant woman and her unborn baby. If you are pregnant, you generally should not have this procedure done.  Slight increase in the risk of cancer. This is because of the radiation involved in the test. What happens before the procedure? No preparation is needed for this procedure. What happens during the procedure?   You will undress and remove any jewelry around your neck or chest.  You will put on a hospital gown.  Sticky electrodes will be placed on your chest. The electrodes will be connected to an electrocardiogram (ECG) machine to record a tracing of the electrical activity of your heart.  A CT scanner will take pictures of your heart. During this time, you will be asked to lie still and hold your breath for 2-3 seconds while a picture of your heart is being taken. The procedure may vary among health care providers and hospitals. What happens after the procedure?  You can get dressed.  You can return to your normal activities.  It is up to you to get the results of your test. Ask your health care provider, or the department that is doing the test, when your results will be ready. Summary  A coronary calcium scan is an imaging test used to look for deposits of calcium and other fatty materials (plaques) in the inner lining of the blood vessels of  the heart (coronary arteries).  Generally, this is a safe procedure. Tell your health care provider if you are pregnant or may be pregnant.  No preparation is needed for this procedure.  A CT scanner will take pictures of your heart.  You can return to your normal activities after the scan is done. This information is not intended to replace advice given  to you by your health care provider. Make sure you discuss any questions you have with your health care provider. Document Released: 02/21/2008 Document Revised: 08/07/2017 Document Reviewed: 07/14/2016 Elsevier Patient Education  2020 [employer].

## 2019-09-06 NOTE — Assessment & Plan Note (Signed)
History of essential pretension blood pressure measured today 152/87.  He is on HydroDIURIL

## 2019-09-20 ENCOUNTER — Ambulatory Visit (INDEPENDENT_AMBULATORY_CARE_PROVIDER_SITE_OTHER)
Admission: RE | Admit: 2019-09-20 | Discharge: 2019-09-20 | Disposition: A | Discharge status: Home / Self Care | Payer: Self-pay | Source: Ambulatory Visit | Attending: Cardiovascular Disease | Admitting: Cardiovascular Disease

## 2019-09-20 ENCOUNTER — Other Ambulatory Visit: Payer: Self-pay

## 2019-09-20 DIAGNOSIS — R072 Precordial pain: Secondary | ICD-10-CM

## 2019-09-27 ENCOUNTER — Ambulatory Visit: Payer: Medicare Other | Attending: Internal Medicine

## 2019-09-27 DIAGNOSIS — Z23 Encounter for immunization: Secondary | ICD-10-CM

## 2019-09-27 NOTE — Progress Notes (Signed)
   Covid-19 Vaccination Clinic  Name:  Jackson Perry    MRN: [id number] DOB: 1946-02-08  09/27/2019  [name] was observed post Covid-19 immunization for 15 minutes without incidence. He was provided with Vaccine Information Sheet and instruction to access the V-Safe system.   [name] was instructed to call 911 with any severe reactions post vaccine: Marland Kitchen Difficulty breathing  . Swelling of your face and throat  . A fast heartbeat  . A bad rash all over your body  . Dizziness and weakness    Immunizations Administered    Name Date Dose VIS Date Route   Pfizer COVID-19 Vaccine 09/27/2019 12:42 PM 0.3 mL 08/19/2019 Intramuscular   Manufacturer: [place]   Lot: [id number]   [place]: [id number]

## 2019-09-28 ENCOUNTER — Telehealth: Payer: Self-pay

## 2019-09-28 DIAGNOSIS — E782 Mixed hyperlipidemia: Secondary | ICD-10-CM

## 2019-09-28 NOTE — Telephone Encounter (Signed)
LM2CB regarding Cardiac CT results

## 2019-09-28 NOTE — Telephone Encounter (Signed)
Follow Up  Patient returning call. Please give patient a call back.  

## 2019-09-28 NOTE — Telephone Encounter (Signed)
-----   Message from Lorretta Harp, MD sent at 09/28/2019  4:05 PM EST ----- Mod elevated Cor CA score 872. LDL 110 on Atorva 20. Increase to 40 and recheck in 2 months

## 2019-09-30 MED ORDER — ATORVASTATIN CALCIUM 40 MG PO TABS: 40.0000 mg | ORAL_TABLET | Freq: Every day | ORAL | 3 refills | Status: DC

## 2019-09-30 NOTE — Telephone Encounter (Signed)
Gave pt results. Put in new orders. Verbalized understanding

## 2019-09-30 NOTE — Addendum Note (Signed)
Addended by: Cain Sieve on: 09/30/2019 09:00 AM   Modules accepted: Orders

## 2019-10-04 ENCOUNTER — Telehealth: Payer: Self-pay | Admitting: *Deleted

## 2019-10-04 NOTE — Telephone Encounter (Signed)
Pt rescheduled for the second vaccine of Jackson Perry for 10/18/19.

## 2019-10-18 ENCOUNTER — Ambulatory Visit: Payer: Medicare Other | Attending: Internal Medicine

## 2019-10-18 DIAGNOSIS — Z23 Encounter for immunization: Secondary | ICD-10-CM

## 2019-10-18 NOTE — Progress Notes (Signed)
   Covid-19 Vaccination Clinic  Name:  Jackson Perry    MRN: [id number] DOB: 23-Aug-1946  10/18/2019  Mr. Bitz was observed post Covid-19 immunization for 15 minutes without incidence. He was provided with Vaccine Information Sheet and instruction to access the V-Safe system.   Mr. Guinane was instructed to call 911 with any severe reactions post vaccine: Marland Kitchen Difficulty breathing  . Swelling of your face and throat  . A fast heartbeat  . A bad rash all over your body  . Dizziness and weakness    Immunizations Administered    Name Date Dose VIS Date Route   Pfizer COVID-19 Vaccine 10/18/2019  1:11 PM 0.3 mL 08/19/2019 Intramuscular   Manufacturer: [place]   Lot: [id number]   [place]: [id number]

## 2019-11-22 ENCOUNTER — Ambulatory Visit (INDEPENDENT_AMBULATORY_CARE_PROVIDER_SITE_OTHER): Payer: Medicare Other | Admitting: Family Medicine

## 2019-11-22 VITALS — BP 152/87 | Ht 68.0 in | Wt 150.0 lb

## 2019-11-22 DIAGNOSIS — Z0000 Encounter for general adult medical examination without abnormal findings: Secondary | ICD-10-CM

## 2019-11-22 NOTE — Progress Notes (Signed)
Presents today for [employer] Visit   Date of last exam: 08/15/2019  Interpreter used for this visit? no I connected with  Jackson Perry on 11/22/19 by a telephone enabled telemedicine application and verified that I am speaking with the correct person using two identifiers.   I discussed the limitations of evaluation and management by telemedicine. The patient expressed understanding and agreed to proceed.  Patient Care Team: Rutherford Guys, MD as PCP - General (Family Medicine) Sable Feil, MD as Consulting Physician (Gastroenterology) Michael Boston, MD as Consulting Physician (General Surgery) Ceasar Mons, MD as Consulting Physician (Urology)   Other items to address today: Immunizations up to date Follow up appt 02/14/2020  Other Screening: Last screening for diabetes: 02/16/2019 Last lipid screening: 08/15/2019 ADVANCE DIRECTIVES: Discussed:yes  On file: no Materials Provided: no Immunization status:  Immunization History  Administered Date(s) Administered  . Fluad Quad(high Dose 65+) 04/29/2019  . Influenza, High Dose Seasonal PF 07/26/2018  . Influenza,inj,Quad PF,6+ Mos 07/10/2015, 05/22/2016, 07/25/2017  . PFIZER SARS-COV-2 Vaccination 09/27/2019, 10/18/2019  . Pneumococcal Conjugate-13 07/03/2016  . Pneumococcal Polysaccharide-23 02/04/2013  . Tetanus 02/04/2013     There are no preventive care reminders to display for this patient.   Functional Status Survey: Is the patient deaf or have difficulty hearing?: No Does the patient have difficulty seeing, even when wearing glasses/contacts?: No Does the patient have difficulty concentrating, remembering, or making decisions?: No Does the patient have difficulty walking or climbing stairs?: No Does the patient have difficulty dressing or bathing?: No Does the patient have difficulty doing errands alone such as visiting a doctor's office or shopping?: No   6CIT  Screen 11/22/2019 10/19/2018  What Year? 0 points 0 points  What month? 0 points 0 points  What time? (No Data) 0 points  Count back from 20 0 points 0 points  Months in reverse 0 points 0 points  Repeat phrase 0 points 0 points  Total Score - 0        Office Visit from 10/19/2018 in [facility] at [facility] [place] [facility]  AUDIT-C Score  4       Home Environment:    Yes grab bars adequate lighting No clutter Scatter rugs  One story home with wife  No trouble climbing stairs   Patient Active Problem List   Diagnosis Date Noted  . Family history of heart disease 09/06/2019  . Tobacco abuse 09/06/2019  . Prostate cancer ([place]) 11/02/2018  . Iron deficiency anemia 11/02/2018  . Arthritis of carpometacarpal [facility]) joint of right thumb 07/25/2016  . Vitamin B12 deficiency 04/26/2009  . Hyperlipidemia 04/26/2009  . Essential hypertension 04/26/2009     Past Medical History:  Diagnosis Date  . Anemia   . Arthritis   . Depression   . Prostate cancer ([place]) 10/14/2018     Past Surgical History:  Procedure Laterality Date  . CHOLECYSTECTOMY       Family History  Problem Relation Age of Onset  . Heart disease Father   . Diabetes Father   . Cancer Father      Social History   Socioeconomic History  . Marital status: Married    Spouse name: Not on file  . Number of children: Not on file  . Years of education: Not on file  . Highest education level: Not on file  Occupational History  . Not on file  Tobacco Use  . Smoking status: Current Some Day Smoker    Types:  Cigarettes    Last attempt to quit: 11/05/2003    Years since quitting: 16.0  . Smokeless tobacco: Never Used  Substance and Sexual Activity  . Alcohol use: Not on file  . Drug use: Not on file  . Sexual activity: Not on file  Other Topics Concern  . Not on file  Social History Narrative  . Not on file   Social Determinants of Health   Financial Resource Strain:   . Difficulty of Paying Living Expenses:    Food Insecurity:   . Worried About Charity fundraiser in the Last Year:   . Arboriculturist in the Last Year:   Transportation Needs:   . Film/video editor (Medical):   Marland Kitchen Lack of Transportation (Non-Medical):   Physical Activity:   . Days of Exercise per Week:   . Minutes of Exercise per Session:   Stress:   . Feeling of Stress :   Social Connections:   . Frequency of Communication with Friends and Family:   . Frequency of Social Gatherings with Friends and Family:   . Attends Religious Services:   . Active Member of Clubs or Organizations:   . Attends Archivist Meetings:   Marland Kitchen Marital Status:   Intimate Partner Violence:   . Fear of Current or Ex-Partner:   . Emotionally Abused:   Marland Kitchen Physically Abused:   . Sexually Abused:      Allergies  Allergen Reactions  . Bupropion Hcl      Prior to Admission medications   Medication Sig Start Date End Date Taking? Authorizing Provider  ALPRAZolam (XANAX) 0.5 MG tablet TAKE 2 TABLETS(1 MG) BY MOUTH AT BEDTIME AS NEEDED FOR SLEEP 08/15/19  Yes Rutherford Guys, MD  aspirin 81 MG tablet Take 81 mg by mouth daily.   Yes [provider]  atorvastatin (LIPITOR) 40 MG tablet Take 1 tablet (40 mg total) by mouth daily. 09/30/19  Yes Lorretta Harp, MD  augmented betamethasone dipropionate (DIPROLENE-AF) 0.05 % cream Apply topically 2 (two) times daily. To pruritic papules on back 07/26/18  Yes Shawnee Knapp, MD  Cholecalciferol (VITAMIN D) 50 MCG (2000 UT) CAPS Take by mouth.   Yes [provider]  cyanocobalamin (,VITAMIN B-12,) 1000 MCG/ML injection Inject 1 mL every 14 days 08/15/19  Yes Rutherford Guys, MD  hydrochlorothiazide (HYDRODIURIL) 25 MG tablet Take 1 tablet (25 mg total) by mouth daily. 08/15/19  Yes Rutherford Guys, MD  Syringe/Needle, Disp, (SYRINGE 3CC/25GX1") 25G X 1" 3 ML MISC Use monthly for b12 injections 08/15/19  Yes Rutherford Guys, MD     Depression screen [facility] 2/9 11/22/2019  08/15/2019 02/16/2019 11/19/2018 10/19/2018  Decreased Interest 0 0 0 0 0  Down, Depressed, Hopeless 0 0 0 0 0  PHQ - 2 Score 0 0 0 0 0     Fall Risk  11/22/2019 08/15/2019 02/16/2019 11/19/2018 10/19/2018  Falls in the past year? 1 0 0 0 0  Number falls in past yr: 0 0 0 - 0  Injury with Fall? 0 0 0 - 0  Follow up Falls evaluation completed;Education provided - - Falls evaluation completed -      PHYSICAL EXAM: BP (!) 152/87   Ht 5\' 8"  (1.727 m)   Wt 150 lb (68 kg)   BMI 22.81 kg/m    Wt Readings from Last 3 Encounters:  11/22/19 150 lb (68 kg)  09/06/19 150 lb 9.6 oz (68.3 kg)  08/15/19  150 lb 12.8 oz (68.4 kg)     No exam data present  Medicare annual wellness visit, subsequent     Education/Counseling provided regarding diet and exercise, prevention of chronic diseases, smoking/tobacco cessation, if applicable, and reviewed "Covered Medicare Preventive Services."

## 2019-11-22 NOTE — Patient Instructions (Signed)
Thank you for taking time to come for your Medicare Wellness Visit. I appreciate your ongoing commitment to your health goals. Please review the following plan we discussed and let me know if I can assist you in the future.  Leroy Kennedy LPN  Preventive Care 75 Years and Older, Male Preventive care refers to lifestyle choices and visits with your health care provider that can promote health and wellness. This includes:  A yearly physical exam. This is also called an annual well check.  Regular dental and eye exams.  Immunizations.  Screening for certain conditions.  Healthy lifestyle choices, such as diet and exercise. What can I expect for my preventive care visit? Physical exam Your health care provider will check:  Height and weight. These may be used to calculate body mass index (BMI), which is a measurement that tells if you are at a healthy weight.  Heart rate and blood pressure.  Your skin for abnormal spots. Counseling Your health care provider may ask you questions about:  Alcohol, tobacco, and drug use.  Emotional well-being.  Home and relationship well-being.  Sexual activity.  Eating habits.  History of falls.  Memory and ability to understand (cognition).  Work and work Statistician. What immunizations do I need?  Influenza (flu) vaccine  This is recommended every year. Tetanus, diphtheria, and pertussis (Tdap) vaccine  You may need a Td booster every 10 years. Varicella (chickenpox) vaccine  You may need this vaccine if you have not already been vaccinated. Zoster (shingles) vaccine  You may need this after age 66. Pneumococcal conjugate (PCV13) vaccine  One dose is recommended after age 84. Pneumococcal polysaccharide (PPSV23) vaccine  One dose is recommended after age 88. Measles, mumps, and rubella (MMR) vaccine  You may need at least one dose of MMR if you were born in 1957 or later. You may also need a second dose. Meningococcal  conjugate (MenACWY) vaccine  You may need this if you have certain conditions. Hepatitis A vaccine  You may need this if you have certain conditions or if you travel or work in places where you may be exposed to hepatitis A. Hepatitis B vaccine  You may need this if you have certain conditions or if you travel or work in places where you may be exposed to hepatitis B. Haemophilus influenzae type b (Hib) vaccine  You may need this if you have certain conditions. You may receive vaccines as individual doses or as more than one vaccine together in one shot (combination vaccines). Talk with your health care provider about the risks and benefits of combination vaccines. What tests do I need? Blood tests  Lipid and cholesterol levels. These may be checked every 5 years, or more frequently depending on your overall health.  Hepatitis C test.  Hepatitis B test. Screening  Lung cancer screening. You may have this screening every year starting at age 24 if you have a 30-pack-year history of smoking and currently smoke or have quit within the past 15 years.  Colorectal cancer screening. All adults should have this screening starting at age 52 and continuing until age 61. Your health care provider may recommend screening at age 57 if you are at increased risk. You will have tests every 1-10 years, depending on your results and the type of screening test.  Prostate cancer screening. Recommendations will vary depending on your family history and other risks.  Diabetes screening. This is done by checking your blood sugar (glucose) after you have not eaten for  a while (fasting). You may have this done every 1-3 years.  Abdominal aortic aneurysm (AAA) screening. You may need this if you are a current or former smoker.  Sexually transmitted disease (STD) testing. Follow these instructions at home: Eating and drinking  Eat a diet that includes fresh fruits and vegetables, whole grains, lean  protein, and low-fat dairy products. Limit your intake of foods with high amounts of sugar, saturated fats, and salt.  Take vitamin and mineral supplements as recommended by your health care provider.  Do not drink alcohol if your health care provider tells you not to drink.  If you drink alcohol: ? Limit how much you have to 0-2 drinks a day. ? Be aware of how much alcohol is in your drink. In the [place] one drink equals one 12 oz bottle of beer (355 mL), one 5 oz glass of wine (148 mL), or one 1 oz glass of hard liquor (44 mL). Lifestyle  Take daily care of your teeth and gums.  Stay active. Exercise for at least 30 minutes on 5 or more days each week.  Do not use any products that contain nicotine or tobacco, such as cigarettes, e-cigarettes, and chewing tobacco. If you need help quitting, ask your health care provider.  If you are sexually active, practice safe sex. Use a condom or other form of protection to prevent STIs (sexually transmitted infections).  Talk with your health care provider about taking a low-dose aspirin or statin. What's next?  Visit your health care provider once a year for a well check visit.  Ask your health care provider how often you should have your eyes and teeth checked.  Stay up to date on all vaccines. This information is not intended to replace advice given to you by your health care provider. Make sure you discuss any questions you have with your health care provider. Document Revised: 08/19/2018 Document Reviewed: 08/19/2018 Elsevier Patient Education  2020 [employer]  

## 2019-12-09 DIAGNOSIS — E782 Mixed hyperlipidemia: Secondary | ICD-10-CM | POA: Diagnosis not present

## 2019-12-09 LAB — LIPID PANEL
Chol/HDL Ratio: 2.7 ratio (ref 0.0–5.0)
Cholesterol, Total: 129 mg/dL (ref 100–199)
HDL: 48 mg/dL (ref >39)
LDL Chol Calc (NIH): 69 mg/dL (ref 0–99)
Triglycerides: 55 mg/dL (ref 0–149)
VLDL Cholesterol Cal: 12 mg/dL (ref 5–40)

## 2019-12-09 LAB — HEPATIC FUNCTION PANEL
ALT: 63 IU/L — ABNORMAL HIGH (ref 0–44)
AST: 50 IU/L — ABNORMAL HIGH (ref 0–40)
Albumin: 4.3 g/dL (ref 3.7–4.7)
Alkaline Phosphatase: 195 IU/L — ABNORMAL HIGH (ref 39–117)
Bilirubin Total: 0.3 mg/dL (ref 0.0–1.2)
Bilirubin, Direct: 0.14 mg/dL (ref 0.00–0.40)
Total Protein: 6.8 g/dL (ref 6.0–8.5)

## 2019-12-26 ENCOUNTER — Other Ambulatory Visit: Payer: Self-pay

## 2019-12-26 DIAGNOSIS — R748 Abnormal levels of other serum enzymes: Secondary | ICD-10-CM

## 2020-01-05 DIAGNOSIS — H353131 Nonexudative age-related macular degeneration, bilateral, early dry stage: Secondary | ICD-10-CM | POA: Diagnosis not present

## 2020-01-05 DIAGNOSIS — Z961 Presence of intraocular lens: Secondary | ICD-10-CM | POA: Diagnosis not present

## 2020-02-14 ENCOUNTER — Other Ambulatory Visit: Payer: Self-pay

## 2020-02-14 ENCOUNTER — Encounter: Payer: Self-pay | Admitting: Family Medicine

## 2020-02-14 ENCOUNTER — Ambulatory Visit: Payer: Medicare Other | Admitting: Family Medicine

## 2020-02-14 VITALS — BP 136/84 | HR 78 | Temp 98.1°F | Ht 68.0 in | Wt 149.0 lb

## 2020-02-14 DIAGNOSIS — E782 Mixed hyperlipidemia: Secondary | ICD-10-CM

## 2020-02-14 DIAGNOSIS — R202 Paresthesia of skin: Secondary | ICD-10-CM

## 2020-02-14 DIAGNOSIS — E538 Deficiency of other specified B group vitamins: Secondary | ICD-10-CM | POA: Diagnosis not present

## 2020-02-14 DIAGNOSIS — I10 Essential (primary) hypertension: Secondary | ICD-10-CM

## 2020-02-14 DIAGNOSIS — F5104 Psychophysiologic insomnia: Secondary | ICD-10-CM

## 2020-02-14 LAB — CMP14+EGFR
ALT: 24 IU/L (ref 0–44)
AST: 22 IU/L (ref 0–40)
Albumin/Globulin Ratio: 1.7 (ref 1.2–2.2)
Albumin: 4.3 g/dL (ref 3.7–4.7)
Alkaline Phosphatase: 102 IU/L (ref 48–121)
BUN/Creatinine Ratio: 17 (ref 10–24)
BUN: 16 mg/dL (ref 8–27)
Bilirubin Total: 0.5 mg/dL (ref 0.0–1.2)
CO2: 23 mmol/L (ref 20–29)
Calcium: 9.1 mg/dL (ref 8.6–10.2)
Chloride: 101 mmol/L (ref 96–106)
Creatinine, Ser: 0.93 mg/dL (ref 0.76–1.27)
GFR calc Af Amer: 93 mL/min/{1.73_m2} (ref >59)
GFR calc non Af Amer: 81 mL/min/{1.73_m2} (ref >59)
Globulin, Total: 2.6 g/dL (ref 1.5–4.5)
Glucose: 97 mg/dL (ref 65–99)
Potassium: 4.2 mmol/L (ref 3.5–5.2)
Sodium: 138 mmol/L (ref 134–144)
Total Protein: 6.9 g/dL (ref 6.0–8.5)

## 2020-02-14 MED ORDER — ALPRAZOLAM 1 MG PO TABS: 1.0000 mg | ORAL_TABLET | Freq: Every evening | ORAL | 4 refills | Status: DC | PRN

## 2020-02-14 MED ORDER — "SYRINGE 25G X 1"" 3 ML MISC": 0 refills | Status: DC

## 2020-02-14 MED ORDER — CYANOCOBALAMIN 1000 MCG/ML IJ SOLN: INTRAMUSCULAR | 3 refills | Status: DC

## 2020-02-14 MED ORDER — HYDROCHLOROTHIAZIDE 25 MG PO TABS: 25.0000 mg | ORAL_TABLET | Freq: Every day | ORAL | 3 refills | Status: DC

## 2020-02-14 NOTE — Progress Notes (Signed)
6/8/20218:32 AM  Grayling Congress Huhta [birth year], 74 y.o., male [id number]  Chief Complaint  Patient presents with  . Follow-up    6 month follow up on vit b 12. Pt has fall 6 months ago, feels that he damage a nerve on the right side    HPI:   Patient is a 74 y.o. male with past medical history significant for HTN, HLP, b12, iron and D3 deficiency, prostate cancer who presents today for routine followup  Last OV dec 2020 -started atorvastatin 61m, referred to cards given fhx CAD - ordered coronary calcium score - 872, 77th percentile, aggressive LFM, increased atorvastatin 436m Overall doing well Sees urologist next month Tolerating atoravastatin, other than increase in LFTs Continues to smoke 4-5 cig a day Exercises 45-60 minutes of fast walking every morning without any issues Completed covid vaccine In jan 2021 he fell onto his face while chasing his cat, since then has been having numbness tingling going down from neck to right arm, he has FROM, no weakness, overall frequency and intensity of pain is improving, does not affect daiy to day function, he is right handed Needs refills of alprazolam, uses prn for sleep, takes 2 pmp reviewed  Lab Results  Component Value Date   VITAMINB12 640 08/15/2019    Lab Results  Component Value Date   CHOL 129 12/09/2019   HDL 48 12/09/2019   LDLCALC 69 12/09/2019   LDLDIRECT 150 (H) 07/26/2018   TRIG 55 12/09/2019   CHOLHDL 2.7 12/09/2019   Lab Results  Component Value Date   ALT 63 (H) 12/09/2019   AST 50 (H) 12/09/2019   ALKPHOS 195 (H) 12/09/2019   BILITOT 0.3 12/09/2019   Lab Results  Component Value Date   CREATININE 0.98 08/15/2019   BUN 14 08/15/2019   NA 137 08/15/2019   K 4.0 08/15/2019   CL 100 08/15/2019   CO2 24 08/15/2019    Depression screen PHQ 2/9 11/22/2019 08/15/2019 02/16/2019  Decreased Interest 0 0 0  Down, Depressed, Hopeless 0 0 0  PHQ - 2 Score 0 0 0    Fall Risk  02/14/2020 11/22/2019 08/15/2019  02/16/2019 11/19/2018  Falls in the past year? 1 1 0 0 0  Number falls in past yr: 0 0 0 0 -  Injury with Fall? 1 0 0 0 -  Follow up - Falls evaluation completed;Education provided - - Falls evaluation completed     Allergies  Allergen Reactions  . Bupropion Hcl     Prior to Admission medications   Medication Sig Start Date End Date Taking? Authorizing Provider  ALPRAZolam (XANAX) 0.5 MG tablet TAKE 2 TABLETS(1 MG) BY MOUTH AT BEDTIME AS NEEDED FOR SLEEP 08/15/19  Yes SaRutherford GuysMD  aspirin 81 MG tablet Take 81 mg by mouth daily.   Yes [provider]  atorvastatin (LIPITOR) 40 MG tablet Take 1 tablet (40 mg total) by mouth daily. 09/30/19  Yes BeLorretta HarpMD  augmented betamethasone dipropionate (DIPROLENE-AF) 0.05 % cream Apply topically 2 (two) times daily. To pruritic papules on back 07/26/18  Yes ShShawnee KnappMD  Cholecalciferol (VITAMIN D) 50 MCG (2000 UT) CAPS Take by mouth.   Yes [provider]  cyanocobalamin (,VITAMIN B-12,) 1000 MCG/ML injection Inject 1 mL every 14 days 08/15/19  Yes SaRutherford GuysMD  hydrochlorothiazide (HYDRODIURIL) 25 MG tablet Take 1 tablet (25 mg total) by mouth daily. 08/15/19  Yes SaRutherford GuysMD  Syringe/Needle, Disp, (  SYRINGE 3CC/25GX1") 25G X 1" 3 ML MISC Use monthly for b12 injections 08/15/19  Yes Rutherford Guys, MD    Past Medical History:  Diagnosis Date  . Anemia   . Arthritis   . Depression   . Prostate cancer ([place]) 10/14/2018    Past Surgical History:  Procedure Laterality Date  . CHOLECYSTECTOMY      Social History   Tobacco Use  . Smoking status: Current Some Day Smoker    Types: Cigarettes    Last attempt to quit: 11/05/2003    Years since quitting: 16.2  . Smokeless tobacco: Never Used  Substance Use Topics  . Alcohol use: Not on file    Family History  Problem Relation Age of Onset  . Heart disease Father   . Diabetes Father   . Cancer Father     Review of Systems   Constitutional: Negative for chills and fever.  Respiratory: Negative for cough and shortness of breath.   Cardiovascular: Negative for chest pain, palpitations and leg swelling.  Gastrointestinal: Negative for abdominal pain, nausea and vomiting.  per hpi   OBJECTIVE:  Today's Vitals   02/14/20 0818  BP: 136/84  Pulse: 78  Temp: 98.1 F (36.7 C)  SpO2: 97%  Weight: 149 lb (67.6 kg)  Height: 5' 8" (1.727 m)   Body mass index is 22.66 kg/m.   Physical Exam Vitals and nursing note reviewed.  Constitutional:      Appearance: He is well-developed.  HENT:     Head: Normocephalic and atraumatic.  Eyes:     Conjunctiva/sclera: Conjunctivae normal.     Pupils: Pupils are equal, round, and reactive to light.  Cardiovascular:     Rate and Rhythm: Normal rate and regular rhythm.     Heart sounds: No murmur. No friction rub. No gallop.   Pulmonary:     Effort: Pulmonary effort is normal.     Breath sounds: Normal breath sounds. No wheezing or rales.  Musculoskeletal:     Cervical back: Neck supple.  Skin:    General: Skin is warm and dry.  Neurological:     Mental Status: He is alert and oriented to person, place, and time.     No results found for this or any previous visit (from the past 24 hour(s)).  No results found.   ASSESSMENT and PLAN  1. Essential hypertension Controlled. Continue current regime.  - CMP14+EGFR - hydrochlorothiazide (HYDRODIURIL) 25 MG tablet; Take 1 tablet (25 mg total) by mouth daily.  2. Vitamin B12 deficiency Controlled. Continue current regime.  - cyanocobalamin (,VITAMIN B-12,) 1000 MCG/ML injection; Inject 1 mL every 14 days  3. Mixed hyperlipidemia Controlled. Continue current regime.  - CMP14+EGFR  4. Psychophysiological insomnia Stable. Reviewed r/se/b - ALPRAZolam (XANAX) 1 MG tablet; Take 1 tablet (1 mg total) by mouth at bedtime as needed for anxiety.  5. Paresthesia of right arm Improving. Discussed options, patient  decided to continue monitoring for now  Other orders - Syringe/Needle, Disp, (SYRINGE 3CC/25GX1") 25G X 1" 3 ML MISC; Use monthly for b12 injections  Return in about 6 months (around 08/15/2020).    Rutherford Guys, MD [facility] at [facility], [place] [address] Ph.  [phone] Fax [phone]

## 2020-02-14 NOTE — Patient Instructions (Signed)
° ° ° °  If you have lab work done today you will be contacted with your lab results within the next 2 weeks.  If you have not heard from us then please contact us. The fastest way to get your results is to register for My Chart. ° ° °IF you received an x-ray today, you will receive an invoice from [facility]. Please contact [facility] at [phone] with questions or concerns regarding your invoice.  ° °IF you received labwork today, you will receive an invoice from [facility]. Please contact [facility] at [phone] with questions or concerns regarding your invoice.  ° °Our billing staff will not be able to assist you with questions regarding bills from these companies. ° °You will be contacted with the lab results as soon as they are available. The fastest way to get your results is to activate your My Chart account. Instructions are located on the last page of this paperwork. If you have not heard from us regarding the results in 2 weeks, please contact this office. °  ° ° ° °

## 2020-03-28 ENCOUNTER — Ambulatory Visit (INDEPENDENT_AMBULATORY_CARE_PROVIDER_SITE_OTHER): Payer: Self-pay | Admitting: Family Medicine

## 2020-03-28 ENCOUNTER — Other Ambulatory Visit: Payer: Self-pay

## 2020-03-28 DIAGNOSIS — Z23 Encounter for immunization: Secondary | ICD-10-CM

## 2020-08-17 ENCOUNTER — Ambulatory Visit: Payer: Medicare Other | Admitting: Family Medicine

## 2020-09-11 ENCOUNTER — Other Ambulatory Visit: Payer: Medicare Other

## 2020-09-11 DIAGNOSIS — Z20822 Contact with and (suspected) exposure to covid-19: Secondary | ICD-10-CM | POA: Diagnosis not present

## 2020-09-13 ENCOUNTER — Ambulatory Visit (INDEPENDENT_AMBULATORY_CARE_PROVIDER_SITE_OTHER): Payer: Medicare Other | Admitting: Internal Medicine

## 2020-09-13 ENCOUNTER — Other Ambulatory Visit: Payer: Self-pay

## 2020-09-13 ENCOUNTER — Encounter: Payer: Self-pay | Admitting: Internal Medicine

## 2020-09-13 VITALS — BP 138/86 | HR 82 | Temp 97.7°F | Ht 68.0 in | Wt 146.9 lb

## 2020-09-13 DIAGNOSIS — I10 Essential (primary) hypertension: Secondary | ICD-10-CM | POA: Diagnosis not present

## 2020-09-13 DIAGNOSIS — C61 Malignant neoplasm of prostate: Secondary | ICD-10-CM | POA: Diagnosis not present

## 2020-09-13 DIAGNOSIS — M792 Neuralgia and neuritis, unspecified: Secondary | ICD-10-CM

## 2020-09-13 DIAGNOSIS — Z720 Tobacco use: Secondary | ICD-10-CM | POA: Diagnosis not present

## 2020-09-13 DIAGNOSIS — Z9181 History of falling: Secondary | ICD-10-CM

## 2020-09-13 DIAGNOSIS — E782 Mixed hyperlipidemia: Secondary | ICD-10-CM

## 2020-09-13 DIAGNOSIS — F5104 Psychophysiologic insomnia: Secondary | ICD-10-CM

## 2020-09-13 DIAGNOSIS — E538 Deficiency of other specified B group vitamins: Secondary | ICD-10-CM | POA: Diagnosis not present

## 2020-09-13 DIAGNOSIS — Z8249 Family history of ischemic heart disease and other diseases of the circulatory system: Secondary | ICD-10-CM

## 2020-09-13 LAB — SARS-COV-2, NAA 2 DAY TAT

## 2020-09-13 LAB — NOVEL CORONAVIRUS, NAA: SARS-CoV-2, NAA: NOT DETECTED

## 2020-09-13 MED ORDER — ALPRAZOLAM 1 MG PO TABS: 1.0000 mg | ORAL_TABLET | Freq: Every evening | ORAL | 5 refills | Status: DC | PRN

## 2020-09-13 MED ORDER — SYRINGE 25G X 1" 3 ML MISC
0 refills | Status: DC
Start: 2020-09-13 — End: 2021-03-13

## 2020-09-13 MED ORDER — CYANOCOBALAMIN 1000 MCG/ML IJ SOLN: INTRAMUSCULAR | 3 refills | Status: DC

## 2020-09-13 MED ORDER — ATORVASTATIN CALCIUM 40 MG PO TABS: 40.0000 mg | ORAL_TABLET | Freq: Every day | ORAL | 3 refills | Status: DC

## 2020-09-13 MED ORDER — CHANTIX STARTING MONTH PAK 0.5 MG X 11 & 1 MG X 42 PO TABS: ORAL_TABLET | ORAL | 0 refills | Status: DC

## 2020-09-13 MED ORDER — HYDROCHLOROTHIAZIDE 25 MG PO TABS: 25.0000 mg | ORAL_TABLET | Freq: Every day | ORAL | 3 refills | Status: DC

## 2020-09-13 NOTE — Progress Notes (Signed)
Provider:  Rexene Edison. Mariea Clonts, D.O., C.M.D. Location:   [place]  Place of Service:    clinic  Previous PCP: Gayland Curry, DO Patient Care Team: Gayland Curry, DO as PCP - General (Geriatric Medicine) Sable Feil, MD as Consulting Physician (Gastroenterology) Michael Boston, MD as Consulting Physician (General Surgery) Ceasar Mons, MD as Consulting Physician (Urology)  Extended Emergency Contact Information Primary Emergency Contact: Weathers,Ruth Address: [place] [address] Johnnette Litter of [place] Phone: [phone] Work Phone: [phone] Mobile Phone: [phone] Relation: Spouse Secondary Emergency Contact: [place] of [place] Mobile Phone: [phone] Relation: Son  Goals of Care: Advanced Directive information Advanced Directives 09/13/2020  Does Patient Have a Medical Advance Directive? Yes  Type of Paramedic of [place];Out of facility DNR (pink MOST or yellow form);Living will  Does patient want to make changes to medical advance directive? No - Patient declined  Copy of [place] in Chart? No - copy requested      Chief Complaint  Patient presents with  . Establish Care    New patient to establish care     HPI: Patient is a 75 y.o. male seen today to establish with [facility].  Records have been requested from   He has a h/o htn, high cholesterol, and panic attacks.    He's had hernia surgery, two cataract and gallbladder long ago.    HTN:  Has bene controlled with hctz.  He went to Dr. Gwenlyn Found last year b/c his brother and cousin all had heart attacks and heart disease.  Calcium score was ok.    Cholesterol improved after 6 mos on lipitor.  He was diagnosed with prostate cancer in 09/2018.  Has not done anything about it.  Dr Lovena Neighbours at [facility] wanted him to go right in after the biopsies and have radiation of some sort.  He didn't want  to do that in view of covid going around and that decreasing immunity.  PSA test was 2.5.  Sleeps through the night.  Does not have to get up to urinate.  If he does wake up at night, he has difficulty starting to pee.  That will happen if he drinks alcohol. His dad had prostate cancer and he would not want to live the way his father was.    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  He developed it about 10-12 yrs ago--he was tired and irritable.  His b12 was so low they couldn't believe it.  He got a shot 6 days in a row and then was placed on the shots every 10-14 days.  He gets really tired by the time he's due.    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  They don't eat processed foods.  He does smoke--does not smoke in the car or house.  Has one in am with coffee, one with a meal and one with first vodka of night.  Might have a few more when stressed.  They are redoing a townhouse and it's a production.  He would like to quit smoking for his wife.  Says it's psychological.  He feels like it's part of his identity.  He has quit off and on over the years.  Sometimes even for a year or two, sometimes for a month.  He's never used a patch.  He tried nicorette and some other prescription.  He's never tried hypnosis.  They got tested for covid after his wife had a cold.  Negative.    He worked in [employer] and then [employer] until 10 yrs ago.  His wife just retired last year.  Down to one cat (had two when paperwork).  Cheddar passed away and other is Brie.    Every morning 5-6am gets up and walks after his coffee and first cigarette.  1 mile in cold 2 otherwise.  He was on wellbutrin which made him feel like he was going out of his mind (on allergies).  He's willing to try chantix.    Glasses--only for reading.  Could not keep up with readers.    A year ago, he fell chasing their cat that got out and it was running in the dark.  He "busted his face".  He landed on the wooden deck.  Off and on he's had  trouble with a shooting thru the back of his neck when he puts his right arm down.  He has a lot of crunching and cracking in the neck.  It's not debilitating, just slightly bothersome.  Does not want neck surgery, etc.   Right now, with the remodeling after just moving a year and a 1/2 ago and then they've had to move again.  Stressful for him.  The xanax he takes is for sleep.  It does help him get through 4-5 hrs straight.  Only sleeps 6.5-7 anyway.  He takes half a pill actually not a full one.     Past Medical History:  Diagnosis Date  . Anemia   . Arthritis   . Depression   . High blood pressure   . High cholesterol   . Panic attacks   . Prostate cancer ([place]) 10/14/2018   Past Surgical History:  Procedure Laterality Date  . CATARACT EXTRACTION    . CHOLECYSTECTOMY    . HERNIA REPAIR      Social History   Socioeconomic History  . Marital status: Married    Spouse name: Not on file  . Number of children: Not on file  . Years of education: Not on file  . Highest education level: Not on file  Occupational History  . Not on file  Tobacco Use  . Smoking status: Current Some Day Smoker    Types: Cigarettes    Last attempt to quit: 11/05/2003    Years since quitting: 16.8  . Smokeless tobacco: Never Used  . Tobacco comment: 6 per day   Vaping Use  . Vaping Use: Never used  Substance and Sexual Activity  . Alcohol use: Yes  . Drug use: Never  . Sexual activity: Not Currently  Other Topics Concern  . Not on file  Social History Narrative   Diet: Blank      Do you drink/ eat things with caffeine?  Coffee      Marital status:    M                           What year were you married ? 1969      Do you live in a house, apartment,assistred living, condo, trailer, etc.)? Condo      Is it one or more stories? 1      How many persons live in your home ? 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   Yes                           Type & how often 1 and 1/2- 2 mile brisk walk      ADVANCED DIRECTIVES (Please bring copies)      Do you have a living will? Yes      Do you have a DNR form?  Yes                     If not, do you want to discuss one?       Do you have signed POA?HPOA forms?   Yes              If so, please bring to your appointment      FUNCTIONAL STATUS- To be completed by Spouse / child / Staff       Do you have difficulty bathing or dressing yourself ?  No      Do you have difficulty preparing food or eating ? No      Do you have difficulty managing your mediation ? No      Do you have difficulty managing your finances ? No      Do you have difficulty affording your medication ? No      Social Determinants of Radio broadcast assistant Strain: Not on file  Food Insecurity: Not on file  Transportation Needs: Not on file  Physical Activity: Not on file  Stress: Not on file  Social Connections: Not on file    reports that he has been smoking cigarettes. He has never used smokeless tobacco. He reports current alcohol use. He reports that he does not use drugs.  Functional Status Survey:    Family History  Problem Relation Age of Onset  . Heart disease Father        16  . Diabetes Father   . Cancer Father     Health Maintenance  Topic Date Due  . COLONOSCOPY (Pts 45-70yrs Insurance coverage will need to be confirmed)  05/10/2019  . COVID-19 Vaccine (4 - Booster for Pfizer series) 11/09/2020  . TETANUS/TDAP  03/28/2030  . INFLUENZA VACCINE  Completed  . Hepatitis C Screening  Completed  . PNA vac Low Risk Adult  Completed    Allergies  Allergen Reactions  . Bupropion Hcl   . Shrimp [Shellfish Allergy]     Outpatient Encounter Medications as of 09/13/2020  Medication Sig  . ALPRAZolam (XANAX) 1 MG tablet Take 1 tablet (1 mg total) by mouth at bedtime  as needed for anxiety.  Marland Kitchen aspirin 325 MG tablet Take 325 mg by mouth as needed.  Marland Kitchen atorvastatin (LIPITOR) 40 MG tablet Take 1 tablet (40 mg total) by mouth daily.  . Cholecalciferol (VITAMIN D) 50 MCG (2000 UT) CAPS Take 1 capsule by mouth daily.   . cyanocobalamin (,VITAMIN B-12,) 1000 MCG/ML injection Inject 1 mL every 14 days  . hydrochlorothiazide (HYDRODIURIL) 25 MG tablet Take 1 tablet (25 mg total) by mouth daily.  . Syringe/Needle, Disp, (SYRINGE 3CC/25GX1") 25G X 1" 3 ML MISC Use monthly for b12 injections   No facility-administered encounter medications on file as of 09/13/2020.    ROS  Vitals:   09/13/20 1324  BP:  138/86  Pulse: 82  Temp: 97.7 F (36.5 C)  SpO2: 97%  Weight: 146 lb 14.4 oz (66.6 kg)  Height: 5\' 8"  (1.727 m)   Body mass index is 22.34 kg/m. Physical Exam  Labs reviewed: Basic Metabolic Panel: Recent Labs    02/14/20 0903  NA 138  K 4.2  CL 101  CO2 23  GLUCOSE 97  BUN 16  CREATININE 0.93  CALCIUM 9.1   Liver Function Tests: Recent Labs    12/09/19 0837 02/14/20 0903  AST 50* 22  ALT 63* 24  ALKPHOS 195* 102  BILITOT 0.3 0.5  PROT 6.8 6.9  ALBUMIN 4.3 4.3   No results for input(s): LIPASE, AMYLASE in the last 8760 hours. No results for input(s): AMMONIA in the last 8760 hours. CBC: No results for input(s): WBC, NEUTROABS, HGB, HCT, MCV, PLT in the last 8760 hours. Cardiac Enzymes: No results for input(s): CKTOTAL, CKMB, CKMBINDEX, TROPONINI in the last 8760 hours. BNP: Invalid input(s): POCBNP No results found for: HGBA1C Lab Results  Component Value Date   TSH 2.120 07/26/2018   Lab Results  Component Value Date   VITAMINB12 640 08/15/2019   No results found for: FOLATE Lab Results  Component Value Date   IRON 123 02/16/2019   TIBC 315 02/16/2019   FERRITIN 23 (L) 02/16/2019   Assessment/Plan 1. Prostate cancer [facility]) - he opted not to see [facility] or do screening CT due to covid and really not wanting to pursue  XRT -reassess PSA and discussed that having a meeting with Dr. Kathrynn Running still makes sense and they could have a discussion about pros and cons and radiation types - PSA; Future  2. Essential hypertension - bp at goal with hctz, cont same and monitor - hydrochlorothiazide (HYDRODIURIL) 25 MG tablet; Take 1 tablet (25 mg total) by mouth daily.  Dispense: 90 tablet; Refill: 3 - CBC with Differential/Platelet; Future - COMPLETE METABOLIC PANEL WITH GFR; Future  3. Vitamin B12 deficiency - with pernicious anemia -cont q2 wks b12 injections and monitor - cyanocobalamin (,VITAMIN B-12,) 1000 MCG/ML injection; Inject 1 mL every 14 days  Dispense: 10 mL; Refill: 3 - Syringe/Needle, Disp, (SYRINGE 3CC/25GX1") 25G X 1" 3 ML MISC; Use monthly for b12 injections  Dispense: 50 each; Refill: 0 - Vitamin B12; Future  4. Tobacco abuse - cannot take wellbutrin -willing to try chantix so starter pack ordered and if needed, he can get more 1mg  tabs later if he does not quit within the month - varenicline (CHANTIX STARTING MONTH PAK) 0.5 MG X 11 & 1 MG X 42 tablet; Take one 0.5 mg tablet by mouth once daily for 3 days, then increase to one 0.5 mg tablet twice daily for 4 days, then increase to one 1 mg tablet twice daily.  Dispense: 53 tablet; Refill: 0  5. Mixed hyperlipidemia - cont current regimen and check FLP - atorvastatin (LIPITOR) 40 MG tablet; Take 1 tablet (40 mg total) by mouth daily.  Dispense: 90 tablet; Refill: 3 - Lipid panel; Future  6. Family history of heart disease -noted and calcium scoring and Dr. Kathrynn Running notes reviewed -cont aggressive risk factor modification which includes smoking cessation, bp, lipid control and continued exercise  7. History of fall -last year, was mechanical and has some chronic radicular intermittent neck/right shoulder pain from this but wants to hold off on cervical xrays  8. Radicular pain in right arm -hold off on xray for now  9. Psychophysiological  insomnia - ALPRAZolam (  XANAX) 1 MG tablet; Take 1 tablet (1 mg total) by mouth at bedtime as needed for anxiety.  Dispense: 30 tablet; Refill: 5  Labs/tests ordered:   Lab Orders     CBC with Differential/Platelet     COMPLETE METABOLIC PANEL WITH GFR     Lipid panel     Vitamin B12     PSA When able to come in  F/u in 6 mos  Laurette Villescas L. Tadhg Eskew, D.O. [facility] [address], [place] [address] Cell Phone (Mon-Fri 8am-5pm):  [phone] On Call:  [phone] & follow prompts after 5pm & weekends Office Phone:  [phone] Office Fax:  [phone]

## 2020-09-17 ENCOUNTER — Other Ambulatory Visit: Payer: Self-pay

## 2020-09-17 ENCOUNTER — Other Ambulatory Visit: Payer: Medicare Other

## 2020-09-17 DIAGNOSIS — E538 Deficiency of other specified B group vitamins: Secondary | ICD-10-CM

## 2020-09-17 DIAGNOSIS — C61 Malignant neoplasm of prostate: Secondary | ICD-10-CM | POA: Diagnosis not present

## 2020-09-17 DIAGNOSIS — I10 Essential (primary) hypertension: Secondary | ICD-10-CM

## 2020-09-17 DIAGNOSIS — E782 Mixed hyperlipidemia: Secondary | ICD-10-CM | POA: Diagnosis not present

## 2020-09-18 LAB — CBC WITH DIFFERENTIAL/PLATELET
Absolute Monocytes: 518 cells/uL (ref 200–950)
Basophils Absolute: 53 cells/uL (ref 0–200)
Basophils Relative: 0.7 %
Eosinophils Absolute: 53 cells/uL (ref 15–500)
Eosinophils Relative: 0.7 %
HCT: 40.7 % (ref 38.5–50.0)
Hemoglobin: 13 g/dL — ABNORMAL LOW (ref 13.2–17.1)
Lymphs Abs: 1493 cells/uL (ref 850–3900)
MCH: 27.9 pg (ref 27.0–33.0)
MCHC: 31.9 g/dL — ABNORMAL LOW (ref 32.0–36.0)
MCV: 87.3 fL (ref 80.0–100.0)
MPV: 10.8 fL (ref 7.5–12.5)
Monocytes Relative: 6.9 %
Neutro Abs: 5385 cells/uL (ref 1500–7800)
Neutrophils Relative %: 71.8 %
Platelets: 288 10*3/uL (ref 140–400)
RBC: 4.66 10*6/uL (ref 4.20–5.80)
RDW: 14.7 % (ref 11.0–15.0)
Total Lymphocyte: 19.9 %
WBC: 7.5 10*3/uL (ref 3.8–10.8)

## 2020-09-18 LAB — LIPID PANEL
Cholesterol: 130 mg/dL (ref <200)
HDL: 53 mg/dL (ref >=40)
LDL Cholesterol (Calc): 64 mg/dL (calc)
Non-HDL Cholesterol (Calc): 77 mg/dL (calc) (ref <130)
Total CHOL/HDL Ratio: 2.5 (calc) (ref <5.0)
Triglycerides: 45 mg/dL (ref <150)

## 2020-09-18 LAB — COMPLETE METABOLIC PANEL WITH GFR
AG Ratio: 1.7 (calc) (ref 1.0–2.5)
ALT: 24 U/L (ref 9–46)
AST: 23 U/L (ref 10–35)
Albumin: 4.2 g/dL (ref 3.6–5.1)
Alkaline phosphatase (APISO): 65 U/L (ref 35–144)
BUN: 17 mg/dL (ref 7–25)
CO2: 27 mmol/L (ref 20–32)
Calcium: 9.5 mg/dL (ref 8.6–10.3)
Chloride: 105 mmol/L (ref 98–110)
Creat: 1 mg/dL (ref 0.70–1.18)
GFR, Est African American: 85 mL/min/{1.73_m2} (ref >=60)
GFR, Est Non African American: 73 mL/min/{1.73_m2} (ref >=60)
Globulin: 2.5 g/dL (calc) (ref 1.9–3.7)
Glucose, Bld: 105 mg/dL — ABNORMAL HIGH (ref 65–99)
Potassium: 4.5 mmol/L (ref 3.5–5.3)
Sodium: 141 mmol/L (ref 135–146)
Total Bilirubin: 0.5 mg/dL (ref 0.2–1.2)
Total Protein: 6.7 g/dL (ref 6.1–8.1)

## 2020-09-18 LAB — VITAMIN B12: Vitamin B-12: 606 pg/mL (ref 200–1100)

## 2020-09-18 LAB — PSA: PSA: 2.89 ng/mL (ref <=4.0)

## 2020-09-18 NOTE — Progress Notes (Signed)
Labs look good. Very mild anemia (but oscillates over the years) PSA (prostate) is up from the last one in the system, but down from what I saw in urology notes.  I do recommend he meet with Dr. Tammi Klippel on a consultation about the radiation recommendations.  If he agrees, I'll place the referral.   B12, cholesterol ok. Electrolytes, liver and kidneys ok Sugar at that point in time was slightly elevated.  We should check an hba1c next time we do labs for hyperglycemia.

## 2020-09-19 ENCOUNTER — Telehealth (INDEPENDENT_AMBULATORY_CARE_PROVIDER_SITE_OTHER): Payer: Medicare Other | Admitting: Cardiology

## 2020-09-19 ENCOUNTER — Encounter: Payer: Self-pay | Admitting: Cardiology

## 2020-09-19 VITALS — Ht 68.0 in | Wt 147.0 lb

## 2020-09-19 DIAGNOSIS — I2510 Atherosclerotic heart disease of native coronary artery without angina pectoris: Secondary | ICD-10-CM

## 2020-09-19 DIAGNOSIS — I10 Essential (primary) hypertension: Secondary | ICD-10-CM | POA: Diagnosis not present

## 2020-09-19 DIAGNOSIS — Z8249 Family history of ischemic heart disease and other diseases of the circulatory system: Secondary | ICD-10-CM

## 2020-09-19 DIAGNOSIS — Z720 Tobacco use: Secondary | ICD-10-CM | POA: Diagnosis not present

## 2020-09-19 DIAGNOSIS — C61 Malignant neoplasm of prostate: Secondary | ICD-10-CM

## 2020-09-19 NOTE — Progress Notes (Signed)
Virtual Visit via Telephone Note   This visit type was conducted due to national recommendations for restrictions regarding the COVID-19 Pandemic (e.g. social distancing) in an effort to limit this patient's exposure and mitigate transmission in our community.  Due to his co-morbid illnesses, this patient is at least at moderate risk for complications without adequate follow up.  This format is felt to be most appropriate for this patient at this time.  The patient did not have access to video technology/had technical difficulties with video requiring transitioning to audio format only (telephone).  All issues noted in this document were discussed and addressed.  No physical exam could be performed with this format.  Please refer to the patient's chart for his  consent to telehealth for [facility].    Date:  09/19/2020   ID:  Jackson Perry, DOB [birth year], MRN [id number] The patient was identified using 2 identifiers.  Patient Location: Home Provider Location: Home Office  PCP:  Gayland Curry, DO  Cardiologist:  Dr Gwenlyn Found Electrophysiologist:  None   Evaluation Performed:  Follow-Up Visit  Chief Complaint:  none  History of Present Illness:    Jackson Perry is a 75 y.o. male with history of hypertension, smoking, treated dyslipidemia, a family history of coronary disease, prostate cancer, and CAD documented by coronary CT scan in January 2021.  Patient was seen by Dr. Alvester Chou in December 2020 because of multiple risk factors.  He was asymptomatic.  He walks every day without chest pain or shortness of breath.  Coronary CT scoring January 2021 revealed calcium in 4 vessels with a calcium score of 872 which put him at the [place] percentile for his age.  Aggressive risk factor modification was suggested.  He is on statin therapy, recent fasting lipids done January 2022 showed an LDL of 64.  He continues to smoke 4 to 5 cigarettes a day, his primary care provider has prescribed Chantix  which currently is on backorder but he plans to start this.  The patient does not have symptoms concerning for COVID-19 infection (fever, chills, cough, or new shortness of breath).    Past Medical History:  Diagnosis Date  . Anemia   . Arthritis   . Depression   . High blood pressure   . High cholesterol   . Panic attacks   . Prostate cancer ([place]) 10/14/2018   Past Surgical History:  Procedure Laterality Date  . CATARACT EXTRACTION    . CHOLECYSTECTOMY    . HERNIA REPAIR       Current Meds  Medication Sig  . ALPRAZolam (XANAX) 1 MG tablet Take 1 tablet (1 mg total) by mouth at bedtime as needed for anxiety.  Marland Kitchen aspirin 325 MG tablet Take 325 mg by mouth as needed.  Marland Kitchen atorvastatin (LIPITOR) 40 MG tablet Take 1 tablet (40 mg total) by mouth daily.  . Cholecalciferol (VITAMIN D) 50 MCG (2000 UT) CAPS Take 1 capsule by mouth daily.   . cyanocobalamin (,VITAMIN B-12,) 1000 MCG/ML injection Inject 1 mL every 14 days  . hydrochlorothiazide (HYDRODIURIL) 25 MG tablet Take 1 tablet (25 mg total) by mouth daily.     Allergies:   Bupropion hcl and Shrimp [shellfish allergy]   Social History   Tobacco Use  . Smoking status: Current Some Day Smoker    Types: Cigarettes    Last attempt to quit: 11/05/2003    Years since quitting: 16.8  . Smokeless tobacco: Never Used  . Tobacco comment: 6 per  day   Vaping Use  . Vaping Use: Never used  Substance Use Topics  . Alcohol use: Yes  . Drug use: Never     Family Hx: The patient's family history includes Cancer in his father; Diabetes in his father; Heart disease in his father.  ROS:   Please see the history of present illness.    All other systems reviewed and are negative.   Prior CV studies:   The following studies were reviewed today:  CT scoring 09/20/2019- Coronary arteries: Normal coronary origins. Calcifications noted in the LM, LAD, RCA and LCx.  IMPRESSION: Coronary calcium score of 872. This was 77th percentile  for age and sex matched control.  Recommend aggressive risk factor modification.   Labs/Other Tests and Data Reviewed:    EKG:  An ECG dated 08/28/2019 was personally reviewed today and demonstrated:  NSR, HR 65, 1st AVB  Recent Labs: 09/17/2020: ALT 24; BUN 17; Creat 1.00; Hemoglobin 13.0; Platelets 288; Potassium 4.5; Sodium 141   Recent Lipid Panel Lab Results  Component Value Date/Time   CHOL 130 09/17/2020 08:17 AM   CHOL 129 12/09/2019 08:37 AM   CHOL 180 02/10/2014 03:29 PM   TRIG 45 09/17/2020 08:17 AM   TRIG 58 02/10/2014 03:29 PM   HDL 53 09/17/2020 08:17 AM   HDL 48 12/09/2019 08:37 AM   HDL 35 (L) 02/10/2014 03:29 PM   CHOLHDL 2.5 09/17/2020 08:17 AM   LDLCALC 64 09/17/2020 08:17 AM   LDLCALC 133 (H) 02/10/2014 03:29 PM   LDLDIRECT 150 (H) 07/26/2018 05:54 PM    Wt Readings from Last 3 Encounters:  09/19/20 147 lb (66.7 kg)  09/13/20 146 lb 14.4 oz (66.6 kg)  02/14/20 149 lb (67.6 kg)     Risk Assessment/Calculations:      Objective:    Vital Signs:  Ht 5\' 8"  (1.727 m)   Wt 147 lb (66.7 kg)   BMI 22.35 kg/m    VITAL SIGNS:  reviewed  ASSESSMENT & PLAN:    CAD- 4V Ca++ on CT- no symptoms  HLD- LDL 64  HTN- Controlled  Smoker- 4-5 cigarettes a day.  He plans to quit.  His PCP is working with him.   FM Hx CAD- His brother has had stents placed  Prostate Cancer- He had an elevated PSA in the past.  He saw Dr Lovena Neighbours who suggested radiation but the patient deferred secondary to [place]. PSA done Jan 2022 was WNL.  He has no symptoms.  His PCP is going to arrange a f/u with urology.   Plan:  Decrease ASA to 81 mg daily.  F/U Dr Gwenlyn Found in 6 months.         COVID-19 Education: The signs and symptoms of COVID-19 were discussed with the patient and how to seek care for testing (follow up with PCP or arrange E-visit).  The importance of social distancing was discussed today.  Time:   Today, I have spent 15 minutes with the patient with  telehealth technology discussing the above problems.     Medication Adjustments/Labs and Tests Ordered: Current medicines are reviewed at length with the patient today.  Concerns regarding medicines are outlined above.   Tests Ordered: No orders of the defined types were placed in this encounter.   Medication Changes: No orders of the defined types were placed in this encounter.   Follow Up:  In Person in the office with Dr Gwenlyn Found  Signed, Kerin Ransom, PA-C  09/19/2020 10:16 AM  [facility]

## 2020-09-19 NOTE — Patient Instructions (Signed)
Medication Instructions:  DECREASE- Aspirin 81 mg by mouth daily  *If you need a refill on your cardiac medications before your next appointment, please call your pharmacy*   Lab Work: None Ordered  Testing/Procedures: None Ordered  Follow-Up: At [facility], you and your health needs are our priority.  As part of our continuing mission to provide you with exceptional heart care, we have created designated Provider Care Teams.  These Care Teams include your primary Cardiologist (physician) and Advanced Practice Providers (APPs -  Physician Assistants and Nurse Practitioners) who all work together to provide you with the care you need, when you need it.  We recommend signing up for the patient portal called "MyChart".  Sign up information is provided on this After Visit Summary.  MyChart is used to connect with patients for Virtual Visits (Telemedicine).  Patients are able to view lab/test results, encounter notes, upcoming appointments, etc.  Non-urgent messages can be sent to your provider as well.   To learn more about what you can do with MyChart, go to [email or web address].    Your next appointment:   6 month(s)   The format for your next appointment:   In Person  Provider:   You may see Quay Burow, MD or one of the following Advanced Practice Providers on your designated Care Team:    Kerin Ransom, PA-C  [place], [place]  Coletta Memos, [place]

## 2020-09-30 ENCOUNTER — Other Ambulatory Visit: Payer: Self-pay | Admitting: Cardiovascular Disease

## 2020-09-30 DIAGNOSIS — E782 Mixed hyperlipidemia: Secondary | ICD-10-CM

## 2020-10-29 ENCOUNTER — Encounter: Payer: Self-pay | Admitting: Internal Medicine

## 2021-03-13 ENCOUNTER — Other Ambulatory Visit: Payer: Self-pay

## 2021-03-13 ENCOUNTER — Ambulatory Visit (INDEPENDENT_AMBULATORY_CARE_PROVIDER_SITE_OTHER): Payer: Medicare Other | Admitting: Nurse Practitioner

## 2021-03-13 ENCOUNTER — Encounter: Payer: Self-pay | Admitting: Nurse Practitioner

## 2021-03-13 VITALS — BP 140/80 | HR 67 | Temp 97.7°F | Ht 68.0 in | Wt 149.6 lb

## 2021-03-13 DIAGNOSIS — F5104 Psychophysiologic insomnia: Secondary | ICD-10-CM | POA: Diagnosis not present

## 2021-03-13 DIAGNOSIS — C61 Malignant neoplasm of prostate: Secondary | ICD-10-CM

## 2021-03-13 DIAGNOSIS — Z720 Tobacco use: Secondary | ICD-10-CM

## 2021-03-13 DIAGNOSIS — R739 Hyperglycemia, unspecified: Secondary | ICD-10-CM

## 2021-03-13 DIAGNOSIS — R7989 Other specified abnormal findings of blood chemistry: Secondary | ICD-10-CM | POA: Diagnosis not present

## 2021-03-13 DIAGNOSIS — E782 Mixed hyperlipidemia: Secondary | ICD-10-CM | POA: Diagnosis not present

## 2021-03-13 DIAGNOSIS — E538 Deficiency of other specified B group vitamins: Secondary | ICD-10-CM | POA: Diagnosis not present

## 2021-03-13 DIAGNOSIS — L309 Dermatitis, unspecified: Secondary | ICD-10-CM

## 2021-03-13 DIAGNOSIS — Z1211 Encounter for screening for malignant neoplasm of colon: Secondary | ICD-10-CM

## 2021-03-13 DIAGNOSIS — Z1212 Encounter for screening for malignant neoplasm of rectum: Secondary | ICD-10-CM

## 2021-03-13 DIAGNOSIS — I10 Essential (primary) hypertension: Secondary | ICD-10-CM | POA: Diagnosis not present

## 2021-03-13 DIAGNOSIS — E559 Vitamin D deficiency, unspecified: Secondary | ICD-10-CM

## 2021-03-13 MED ORDER — BETAMETHASONE DIPROPIONATE 0.05 % EX CREA: TOPICAL_CREAM | Freq: Two times a day (BID) | CUTANEOUS | 3 refills | Status: DC

## 2021-03-13 MED ORDER — VARENICLINE TARTRATE 0.5 MG X 11 & 1 MG X 42 PO MISC: ORAL | 0 refills | Status: DC

## 2021-03-13 MED ORDER — ASPIRIN EC 81 MG PO TBEC: 81.0000 mg | DELAYED_RELEASE_TABLET | Freq: Every day | ORAL | 5 refills | Status: DC

## 2021-03-13 MED ORDER — "SYRINGE 25G X 1"" 3 ML MISC": 0 refills | Status: DC

## 2021-03-13 MED ORDER — ALPRAZOLAM 1 MG PO TABS: 1.0000 mg | ORAL_TABLET | Freq: Every evening | ORAL | 5 refills | Status: DC | PRN

## 2021-03-13 MED ORDER — ATORVASTATIN CALCIUM 40 MG PO TABS
40.0000 mg | ORAL_TABLET | Freq: Every day | ORAL | 3 refills | Status: DC
Start: 2021-03-13 — End: 2022-03-10

## 2021-03-13 MED ORDER — VITAMIN D 50 MCG (2000 UT) PO CAPS: 1.0000 | ORAL_CAPSULE | Freq: Every day | ORAL | 5 refills | Status: DC

## 2021-03-13 MED ORDER — HYDROCHLOROTHIAZIDE 25 MG PO TABS: 25.0000 mg | ORAL_TABLET | Freq: Every day | ORAL | 3 refills | Status: DC

## 2021-03-13 MED ORDER — CYANOCOBALAMIN 1000 MCG/ML IJ SOLN: INTRAMUSCULAR | 3 refills | Status: DC

## 2021-03-13 NOTE — Progress Notes (Signed)
Careteam: Patient Care Team: [name], NP as PCP - General (Geriatric Medicine) Sable Feil, MD as Consulting Physician (Gastroenterology) Michael Boston, MD as Consulting Physician (General Surgery) Ceasar Mons, MD as Consulting Physician (Urology)  PLACE OF SERVICE:  [place] Directive information Does Patient Have a Medical Advance Directive?: Yes, Type of Advance Directive: [place];Living will;Out of facility DNR (pink MOST or yellow form), Does patient want to make changes to medical advance directive?: No - Patient declined  Allergies  Allergen Reactions   Bupropion Hcl    Shrimp [Shellfish Allergy]     Chief Complaint  Patient presents with   Medical Management of Chronic Issues    6 month follow up.    Health Maintenance    Zoster vaccine,colonoscopy, 3rd COVID booster     HPI: Patient is a 75 y.o. male for follow up  He was diagnosised with prostate cancer in January 2020. He did not want to start radiation during that time.  His dad had radiation and he did not want to go that route.  He does not have symptoms. He was tested after PSA was elevated but since has been normal.  He saw Dr Lovena Neighbours in the past but has not followed since.  Would not want to see him again.   Smoker- he never got chantix but would like to try   B12 def- gives injection every 2 weeks, sometimes more sometimes less. Can tell when he is very fatigue.   Insomnia- uses alprazolam 1 mg by mouth at bedtime- does the same thing as sleeping pills but cheaper. Uses 1/2-1 tablet at bedtime most of the time uses 1 tablet.   Walking every morning 1-2 miles and gardens regularly.    Review of Systems:  Review of Systems  Constitutional:  Negative for chills, fever and weight loss.  HENT:  Negative for tinnitus.   Respiratory:  Negative for cough, sputum production and shortness of breath.   Cardiovascular:  Negative for chest  pain, palpitations and leg swelling.  Gastrointestinal:  Negative for abdominal pain, constipation, diarrhea and heartburn.  Genitourinary:  Negative for dysuria, frequency and urgency.  Musculoskeletal:  Positive for myalgias and neck pain. Negative for back pain, falls and joint pain.  Skin: Negative.   Neurological:  Negative for dizziness and headaches.  Psychiatric/Behavioral:  Negative for depression and memory loss. The patient does not have insomnia.   Past Medical History:  Diagnosis Date   Anemia    Arthritis    Depression    High blood pressure    High cholesterol    Panic attacks    Prostate cancer ([place]) 10/14/2018   Past Surgical History:  Procedure Laterality Date   CATARACT EXTRACTION     CHOLECYSTECTOMY     HERNIA REPAIR     Social History:   reports that he has been smoking cigarettes. He has never used smokeless tobacco. He reports current alcohol use. He reports that he does not use drugs.  Family History  Problem Relation Age of Onset   Heart disease Father        66   Diabetes Father    Cancer Father     Medications: Patient's Medications  New Prescriptions   No medications on file  Previous Medications   ALPRAZOLAM (XANAX) 1 MG TABLET    Take 1 tablet (1 mg total) by mouth at bedtime as needed for anxiety.   ASPIRIN EC 81 MG TABLET  Take 81 mg by mouth daily. Swallow whole.   ATORVASTATIN (LIPITOR) 40 MG TABLET    Take 1 tablet (40 mg total) by mouth daily.   BETAMETHASONE DIPROPIONATE 0.05 % CREAM    Apply topically 2 (two) times daily.   CHOLECALCIFEROL (VITAMIN D) 50 MCG (2000 UT) CAPS    Take 1 capsule by mouth daily.    CYANOCOBALAMIN (,VITAMIN B-12,) 1000 MCG/ML INJECTION    Inject 1 mL every 14 days   HYDROCHLOROTHIAZIDE (HYDRODIURIL) 25 MG TABLET    Take 1 tablet (25 mg total) by mouth daily.   SYRINGE/NEEDLE, DISP, (SYRINGE 3CC/25GX1") 25G X 1" 3 ML MISC    Use monthly for b12 injections   VARENICLINE (CHANTIX STARTING MONTH PAK) 0.5 MG  X 11 & 1 MG X 42 TABLET    Take one 0.5 mg tablet by mouth once daily for 3 days, then increase to one 0.5 mg tablet twice daily for 4 days, then increase to one 1 mg tablet twice daily.  Modified Medications   No medications on file  Discontinued Medications   No medications on file    Physical Exam:  Vitals:   03/13/21 0834  BP: 140/80  Pulse: 67  Temp: 97.7 F (36.5 C)  TempSrc: Temporal  SpO2: 99%  Weight: 149 lb 9.6 oz (67.9 kg)  Height: 5\' 8"  (1.727 m)   Body mass index is 22.75 kg/m. Wt Readings from Last 3 Encounters:  03/13/21 149 lb 9.6 oz (67.9 kg)  09/19/20 147 lb (66.7 kg)  09/13/20 146 lb 14.4 oz (66.6 kg)    Physical Exam Constitutional:      General: He is not in acute distress.    Appearance: He is well-developed. He is not diaphoretic.  HENT:     Head: Normocephalic and atraumatic.     Right Ear: External ear normal.     Left Ear: External ear normal.     Mouth/Throat:     Pharynx: No oropharyngeal exudate.  Eyes:     Conjunctiva/sclera: Conjunctivae normal.     Pupils: Pupils are equal, round, and reactive to light.  Cardiovascular:     Rate and Rhythm: Normal rate and regular rhythm.     Heart sounds: Normal heart sounds.  Pulmonary:     Effort: Pulmonary effort is normal.     Breath sounds: Normal breath sounds.  Abdominal:     General: Bowel sounds are normal.     Palpations: Abdomen is soft.  Musculoskeletal:        General: No tenderness.     Cervical back: Normal range of motion and neck supple.     Right lower leg: No edema.     Left lower leg: No edema.  Skin:    General: Skin is warm and dry.  Neurological:     Mental Status: He is alert and oriented to person, place, and time.    Labs reviewed: Basic Metabolic Panel: Recent Labs    09/17/20 0817  NA 141  K 4.5  CL 105  CO2 27  GLUCOSE 105*  BUN 17  CREATININE 1.00  CALCIUM 9.5   Liver Function Tests: Recent Labs    09/17/20 0817  AST 23  ALT 24  BILITOT 0.5   PROT 6.7   No results for input(s): LIPASE, AMYLASE in the last 8760 hours. No results for input(s): AMMONIA in the last 8760 hours. CBC: Recent Labs    09/17/20 0817  WBC 7.5  NEUTROABS 5,385  HGB 13.0*  HCT 40.7  MCV 87.3  PLT 288   Lipid Panel: Recent Labs    09/17/20 0817  CHOL 130  HDL 53  LDLCALC 64  TRIG 45  CHOLHDL 2.5   TSH: No results for input(s): TSH in the last 8760 hours. A1C: No results found for: HGBA1C   Assessment/Plan 1. Psychophysiological insomnia -stable on alprazolam, encouraged to use smallest dose for effect and to use as needed. - ALPRAZolam (XANAX) 1 MG tablet; Take 1 tablet (1 mg total) by mouth at bedtime as needed for anxiety.  Dispense: 30 tablet; Refill: 5  2. Mixed hyperlipidemia Continue dietary modifications. - atorvastatin (LIPITOR) 40 MG tablet; Take 1 tablet (40 mg total) by mouth daily.  Dispense: 90 tablet; Refill: 3  3. Vitamin B12 deficiency -b12 stable on last labs. Continue injections. - cyanocobalamin (,VITAMIN B-12,) 1000 MCG/ML injection; Inject 1 mL every 14 days  Dispense: 10 mL; Refill: 3 - Syringe/Needle, Disp, (SYRINGE 3CC/25GX1") 25G X 1" 3 ML MISC; Use monthly for b12 injections  Dispense: 50 each; Refill: 0 - COMPLETE METABOLIC PANEL WITH GFR  4. Essential hypertension -reports white coat syndrome. Recommended to check at home and send bp over mychart. Low sodium diet discussed and recommended. - aspirin EC 81 MG tablet; Take 1 tablet (81 mg total) by mouth daily. Swallow whole.  Dispense: 30 tablet; Refill: 5 - hydrochlorothiazide (HYDRODIURIL) 25 MG tablet; Take 1 tablet (25 mg total) by mouth daily.  Dispense: 90 tablet; Refill: 3 - COMPLETE METABOLIC PANEL WITH GFR - CBC with Differential/Platelet  5. Tobacco abuse -recommended cessation.  - varenicline (CHANTIX STARTING MONTH PAK) 0.5 MG X 11 & 1 MG X 42 tablet; Take one 0.5 mg tablet by mouth once daily for 3 days, then increase to one 0.5 mg tablet  twice daily for 4 days, then increase to one 1 mg tablet twice daily.  Dispense: 53 tablet; Refill: 0  6. Prostate cancer ([place]) -diagnosised in 2020, asymptomatic, did not opt for treatment at that time but would like to have more discussions as to stages and other treatment options. - Ambulatory referral to Urology - Ambulatory referral to Radiation Oncology  7. Eczema, unspecified type - betamethasone dipropionate 0.05 % cream; Apply topically 2 (two) times daily as needed.  Dispense: 30 g; Refill: 3  8. Vitamin D deficiency - Cholecalciferol (VITAMIN D) 50 MCG (2000 UT) CAPS; Take 1 capsule (2,000 Units total) by mouth daily.  Dispense: 30 capsule; Refill: 5  9. Hyperglycemia -noted on last lab, will follow up a1c today - Hemoglobin A1c  10. Screening for colorectal cancer - Cologuard    Next appt: 6 months, labs at appt [place]. [place], [facility] [phone]

## 2021-03-13 NOTE — Patient Instructions (Addendum)
To make appt for Jackson Perry- telephone visit.   Please check blood pressure at home- send reading via mychart (3 or 4 readings) goal bp <140/90

## 2021-03-14 ENCOUNTER — Telehealth: Payer: Self-pay | Admitting: Radiation Oncology

## 2021-03-14 NOTE — Telephone Encounter (Signed)
Called patient to schedule consultation with Dr. Tammi Klippel. No answer, LVM for return call.

## 2021-03-15 ENCOUNTER — Telehealth: Payer: Self-pay | Admitting: Radiation Oncology

## 2021-03-15 ENCOUNTER — Other Ambulatory Visit: Payer: Self-pay

## 2021-03-15 DIAGNOSIS — Z1211 Encounter for screening for malignant neoplasm of colon: Secondary | ICD-10-CM

## 2021-03-15 LAB — COMPLETE METABOLIC PANEL WITH GFR
AG Ratio: 1.5 (calc) (ref 1.0–2.5)
ALT: 18 U/L (ref 9–46)
AST: 20 U/L (ref 10–35)
Albumin: 4 g/dL (ref 3.6–5.1)
Alkaline phosphatase (APISO): 63 U/L (ref 35–144)
BUN: 15 mg/dL (ref 7–25)
CO2: 27 mmol/L (ref 20–32)
Calcium: 9.5 mg/dL (ref 8.6–10.3)
Chloride: 104 mmol/L (ref 98–110)
Creat: 1.03 mg/dL (ref 0.70–1.18)
GFR, Est African American: 82 mL/min/{1.73_m2} (ref >=60)
GFR, Est Non African American: 71 mL/min/{1.73_m2} (ref >=60)
Globulin: 2.6 g/dL (calc) (ref 1.9–3.7)
Glucose, Bld: 89 mg/dL (ref 65–139)
Potassium: 4.3 mmol/L (ref 3.5–5.3)
Sodium: 139 mmol/L (ref 135–146)
Total Bilirubin: 0.4 mg/dL (ref 0.2–1.2)
Total Protein: 6.6 g/dL (ref 6.1–8.1)

## 2021-03-15 LAB — TEST AUTHORIZATION

## 2021-03-15 LAB — CBC WITH DIFFERENTIAL/PLATELET
Absolute Monocytes: 608 cells/uL (ref 200–950)
Basophils Absolute: 40 cells/uL (ref 0–200)
Basophils Relative: 0.5 %
Eosinophils Absolute: 79 cells/uL (ref 15–500)
Eosinophils Relative: 1 %
HCT: 37.6 % — ABNORMAL LOW (ref 38.5–50.0)
Hemoglobin: 11.8 g/dL — ABNORMAL LOW (ref 13.2–17.1)
Lymphs Abs: 1509 cells/uL (ref 850–3900)
MCH: 26.4 pg — ABNORMAL LOW (ref 27.0–33.0)
MCHC: 31.4 g/dL — ABNORMAL LOW (ref 32.0–36.0)
MCV: 84.1 fL (ref 80.0–100.0)
MPV: 10.5 fL (ref 7.5–12.5)
Monocytes Relative: 7.7 %
Neutro Abs: 5664 cells/uL (ref 1500–7800)
Neutrophils Relative %: 71.7 %
Platelets: 288 10*3/uL (ref 140–400)
RBC: 4.47 10*6/uL (ref 4.20–5.80)
RDW: 16 % — ABNORMAL HIGH (ref 11.0–15.0)
Total Lymphocyte: 19.1 %
WBC: 7.9 10*3/uL (ref 3.8–10.8)

## 2021-03-15 LAB — IRON,TIBC AND FERRITIN PANEL
%SAT: 7 % (calc) — ABNORMAL LOW (ref 20–48)
Ferritin: 8 ng/mL — ABNORMAL LOW (ref 24–380)
Iron: 28 ug/dL — ABNORMAL LOW (ref 50–180)
TIBC: 422 mcg/dL (calc) (ref 250–425)

## 2021-03-15 LAB — HEMOGLOBIN A1C
Hgb A1c MFr Bld: 5.5 % of total Hgb (ref <5.7)
Mean Plasma Glucose: 111 mg/dL
eAG (mmol/L): 6.2 mmol/L

## 2021-03-15 NOTE — Telephone Encounter (Signed)
2nd attempt: Called patient to schedule consultation with Dr. Tammi Klippel. No answer, LVM for return call.

## 2021-03-18 ENCOUNTER — Telehealth: Payer: Self-pay | Admitting: *Deleted

## 2021-03-18 DIAGNOSIS — D509 Iron deficiency anemia, unspecified: Secondary | ICD-10-CM

## 2021-03-18 NOTE — Telephone Encounter (Signed)
Order placed, no ifob should have already been addressed

## 2021-03-18 NOTE — Telephone Encounter (Signed)
-----   Message from [name], NP sent at 03/18/2021  8:42 AM EDT ----- Iron level is low, he needs to complete ifob and cologuard. Would recommend start iron supplement (ferrous sulfate 325 mg) once daily at this time. Will need to follow up blood counts on Friday 7/15 to ensure they are stable.

## 2021-03-18 NOTE — Telephone Encounter (Signed)
Pended lab order for approval.  Do you want anything else added.  Please Advise.

## 2021-03-21 ENCOUNTER — Ambulatory Visit: Payer: Medicare Other | Admitting: Internal Medicine

## 2021-03-22 ENCOUNTER — Other Ambulatory Visit: Payer: Medicare Other

## 2021-03-22 ENCOUNTER — Other Ambulatory Visit: Payer: Self-pay

## 2021-03-22 DIAGNOSIS — D509 Iron deficiency anemia, unspecified: Secondary | ICD-10-CM | POA: Diagnosis not present

## 2021-03-22 LAB — CBC WITH DIFFERENTIAL/PLATELET
Absolute Monocytes: 592 cells/uL (ref 200–950)
Basophils Absolute: 61 cells/uL (ref 0–200)
Basophils Relative: 0.7 %
Eosinophils Absolute: 96 cells/uL (ref 15–500)
Eosinophils Relative: 1.1 %
HCT: 38.3 % — ABNORMAL LOW (ref 38.5–50.0)
Hemoglobin: 11.9 g/dL — ABNORMAL LOW (ref 13.2–17.1)
Lymphs Abs: 1383 cells/uL (ref 850–3900)
MCH: 26.4 pg — ABNORMAL LOW (ref 27.0–33.0)
MCHC: 31.1 g/dL — ABNORMAL LOW (ref 32.0–36.0)
MCV: 85.1 fL (ref 80.0–100.0)
MPV: 10.2 fL (ref 7.5–12.5)
Monocytes Relative: 6.8 %
Neutro Abs: 6569 cells/uL (ref 1500–7800)
Neutrophils Relative %: 75.5 %
Platelets: 280 10*3/uL (ref 140–400)
RBC: 4.5 10*6/uL (ref 4.20–5.80)
RDW: 16.3 % — ABNORMAL HIGH (ref 11.0–15.0)
Total Lymphocyte: 15.9 %
WBC: 8.7 10*3/uL (ref 3.8–10.8)

## 2021-03-23 ENCOUNTER — Other Ambulatory Visit: Payer: Self-pay | Admitting: Nurse Practitioner

## 2021-03-26 LAB — FECAL GLOBIN BY IMMUNOCHEMISTRY
FECAL GLOBIN RESULT:: NOT DETECTED
MICRO NUMBER:: 12130859
SPECIMEN QUALITY:: ADEQUATE

## 2021-03-27 ENCOUNTER — Other Ambulatory Visit: Payer: Self-pay

## 2021-03-27 ENCOUNTER — Ambulatory Visit: Admission: RE | Admit: 2021-03-27 | Payer: Medicare Other | Source: Ambulatory Visit

## 2021-03-27 ENCOUNTER — Ambulatory Visit: Payer: Medicare Other | Admitting: Radiation Oncology

## 2021-04-11 DIAGNOSIS — C61 Malignant neoplasm of prostate: Secondary | ICD-10-CM | POA: Diagnosis not present

## 2021-04-11 DIAGNOSIS — R350 Frequency of micturition: Secondary | ICD-10-CM | POA: Diagnosis not present

## 2021-04-11 DIAGNOSIS — N401 Enlarged prostate with lower urinary tract symptoms: Secondary | ICD-10-CM | POA: Diagnosis not present

## 2021-04-16 ENCOUNTER — Other Ambulatory Visit: Payer: Self-pay | Admitting: Urology

## 2021-04-16 DIAGNOSIS — C61 Malignant neoplasm of prostate: Secondary | ICD-10-CM

## 2021-05-09 ENCOUNTER — Ambulatory Visit
Admission: RE | Admit: 2021-05-09 | Discharge: 2021-05-09 | Disposition: A | Discharge status: Home / Self Care | Payer: Medicare Other | Source: Ambulatory Visit | Attending: Urology | Admitting: Urology

## 2021-05-09 ENCOUNTER — Other Ambulatory Visit: Payer: Self-pay

## 2021-05-09 DIAGNOSIS — C61 Malignant neoplasm of prostate: Secondary | ICD-10-CM | POA: Diagnosis not present

## 2021-05-09 DIAGNOSIS — K5730 Diverticulosis of large intestine without perforation or abscess without bleeding: Secondary | ICD-10-CM | POA: Diagnosis not present

## 2021-05-09 DIAGNOSIS — N3289 Other specified disorders of bladder: Secondary | ICD-10-CM | POA: Diagnosis not present

## 2021-05-09 MED ORDER — GADOBENATE DIMEGLUMINE 529 MG/ML IV SOLN
14.0000 mL | Freq: Once | INTRAVENOUS | Status: AC | PRN
  Administered 2021-05-09: 14 mL via INTRAVENOUS

## 2021-05-14 ENCOUNTER — Telehealth: Payer: Self-pay

## 2021-05-14 ENCOUNTER — Other Ambulatory Visit: Payer: Self-pay

## 2021-05-14 ENCOUNTER — Encounter: Payer: Self-pay | Admitting: Nurse Practitioner

## 2021-05-14 ENCOUNTER — Ambulatory Visit (INDEPENDENT_AMBULATORY_CARE_PROVIDER_SITE_OTHER): Payer: Medicare Other | Admitting: Nurse Practitioner

## 2021-05-14 DIAGNOSIS — Z0000 Encounter for general adult medical examination without abnormal findings: Secondary | ICD-10-CM

## 2021-05-14 NOTE — Progress Notes (Signed)
This service is provided via telemedicine  No vital signs collected/recorded due to the encounter was a telemedicine visit.   Location of patient (ex: home, work):  Home  Patient consents to a telephone visit:  Yes, see encounter dated 05/14/2021  Location of the provider (ex: office, home):  [place]  Name of any referring provider:  N/A  Names of all persons participating in the telemedicine service and their role in the encounter:  Sherrie Mustache, Nurse Practitioner, Carroll Kinds, CMA, and patient.   Time spent on call:  12 minutes with medical assistant

## 2021-05-14 NOTE — Telephone Encounter (Signed)
Mr. avant, shoaff are scheduled for a virtual visit with your provider today.    Just as we do with appointments in the office, we must obtain your consent to participate.  Your consent will be active for this visit and any virtual visit you may have with one of our providers in the next 365 days.    If you have a MyChart account, I can also send a copy of this consent to you electronically.  All virtual visits are billed to your insurance company just like a traditional visit in the office.  As this is a virtual visit, video technology does not allow for your provider to perform a traditional examination.  This may limit your provider's ability to fully assess your condition.  If your provider identifies any concerns that need to be evaluated in person or the need to arrange testing such as labs, EKG, etc, we will make arrangements to do so.    Although advances in technology are sophisticated, we cannot ensure that it will always work on either your end or our end.  If the connection with a video visit is poor, we may have to switch to a telephone visit.  With either a video or telephone visit, we are not always able to ensure that we have a secure connection.   I need to obtain your verbal consent now.   Are you willing to proceed with your visit today?   Gared Gazda Brokaw has provided verbal consent on 05/14/2021 for a virtual visit (video or telephone).   Carroll Kinds, CMA 05/14/2021  11:41 AM

## 2021-05-14 NOTE — Telephone Encounter (Signed)
Made 4 unsuccessful attempts to reach patient to start AWV. Phone went to voicemail and voicemail message left each time informing patient of who I was, why I was calling and that the number I was calling from may come up on caller id as a [place] or [place] number. The 4th and final message I informed patient he would have to call office to reschedule appointment.  1st attempt-9:57 am 2nd attempt-10:01 am 3rd attempt 10:09 am 4th and final attempt- 10:16 am

## 2021-05-14 NOTE — Patient Instructions (Signed)
[name] , Thank you for taking time to come for your Medicare Wellness Visit. I appreciate your ongoing commitment to your health goals. Please review the following plan we discussed and let me know if I can assist you in the future.   Screening recommendations/referrals: Colonoscopy aged out Recommended yearly ophthalmology/optometry visit for glaucoma screening and checkup Recommended yearly dental visit for hygiene and checkup  Vaccinations: Influenza vaccine recommended at this time.  Pneumococcal vaccine- up to date Tdap vaccine up to date Shingles vaccine RECOMMENDED to get at local pharmacy    Advanced directives: on file.   Conditions/risks identified: advanced age, male, hypertension and hyperlipidemia  Next appointment: 1 year  Preventive Care 76 Years and Older, Male Preventive care refers to lifestyle choices and visits with your health care provider that can promote health and wellness. What does preventive care include? A yearly physical exam. This is also called an annual well check. Dental exams once or twice a year. Routine eye exams. Ask your health care provider how often you should have your eyes checked. Personal lifestyle choices, including: Daily care of your teeth and gums. Regular physical activity. Eating a healthy diet. Avoiding tobacco and drug use. Limiting alcohol use. Practicing safe sex. Taking low doses of aspirin every day. Taking vitamin and mineral supplements as recommended by your health care provider. What happens during an annual well check? The services and screenings done by your health care provider during your annual well check will depend on your age, overall health, lifestyle risk factors, and family history of disease. Counseling  Your health care provider may ask you questions about your: Alcohol use. Tobacco use. Drug use. Emotional well-being. Home and relationship well-being. Sexual activity. Eating habits. History  of falls. Memory and ability to understand (cognition). Work and work Statistician. Screening  You may have the following tests or measurements: Height, weight, and BMI. Blood pressure. Lipid and cholesterol levels. These may be checked every 5 years, or more frequently if you are over 30 years old. Skin check. Lung cancer screening. You may have this screening every year starting at age 76 if you have a 30-pack-year history of smoking and currently smoke or have quit within the past 15 years. Fecal occult blood test (FOBT) of the stool. You may have this test every year starting at age 76. Flexible sigmoidoscopy or colonoscopy. You may have a sigmoidoscopy every 5 years or a colonoscopy every 10 years starting at age 76. Prostate cancer screening. Recommendations will vary depending on your family history and other risks. Hepatitis C blood test. Hepatitis B blood test. Sexually transmitted disease (STD) testing. Diabetes screening. This is done by checking your blood sugar (glucose) after you have not eaten for a while (fasting). You may have this done every 1-3 years. Abdominal aortic aneurysm (AAA) screening. You may need this if you are a current or former smoker. Osteoporosis. You may be screened starting at age 76 if you are at high risk. Talk with your health care provider about your test results, treatment options, and if necessary, the need for more tests. Vaccines  Your health care provider may recommend certain vaccines, such as: Influenza vaccine. This is recommended every year. Tetanus, diphtheria, and acellular pertussis (Tdap, Td) vaccine. You may need a Td booster every 10 years. Zoster vaccine. You may need this after age 76. Pneumococcal 13-valent conjugate (PCV13) vaccine. One dose is recommended after age 76. Pneumococcal polysaccharide (PPSV23) vaccine. One dose is recommended after age 76 Talk to  your health care provider about which screenings and vaccines you need  and how often you need them. This information is not intended to replace advice given to you by your health care provider. Make sure you discuss any questions you have with your health care provider. Document Released: 09/21/2015 Document Revised: 05/14/2016 Document Reviewed: 06/26/2015 Elsevier Interactive Patient Education  2017 [place] Prevention in the Home Falls can cause injuries. They can happen to people of all ages. There are many things you can do to make your home safe and to help prevent falls. What can I do on the outside of my home? Regularly fix the edges of walkways and driveways and fix any cracks. Remove anything that might make you trip as you walk through a door, such as a raised step or threshold. Trim any bushes or trees on the path to your home. Use bright outdoor lighting. Clear any walking paths of anything that might make someone trip, such as rocks or tools. Regularly check to see if handrails are loose or broken. Make sure that both sides of any steps have handrails. Any raised decks and porches should have guardrails on the edges. Have any leaves, snow, or ice cleared regularly. Use sand or salt on walking paths during winter. Clean up any spills in your garage right away. This includes oil or grease spills. What can I do in the bathroom? Use night lights. Install grab bars by the toilet and in the tub and shower. Do not use towel bars as grab bars. Use non-skid mats or decals in the tub or shower. If you need to sit down in the shower, use a plastic, non-slip stool. Keep the floor dry. Clean up any water that spills on the floor as soon as it happens. Remove soap buildup in the tub or shower regularly. Attach bath mats securely with double-sided non-slip rug tape. Do not have throw rugs and other things on the floor that can make you trip. What can I do in the bedroom? Use night lights. Make sure that you have a light by your bed that is easy  to reach. Do not use any sheets or blankets that are too big for your bed. They should not hang down onto the floor. Have a firm chair that has side arms. You can use this for support while you get dressed. Do not have throw rugs and other things on the floor that can make you trip. What can I do in the kitchen? Clean up any spills right away. Avoid walking on wet floors. Keep items that you use a lot in easy-to-reach places. If you need to reach something above you, use a strong step stool that has a grab bar. Keep electrical cords out of the way. Do not use floor polish or wax that makes floors slippery. If you must use wax, use non-skid floor wax. Do not have throw rugs and other things on the floor that can make you trip. What can I do with my stairs? Do not leave any items on the stairs. Make sure that there are handrails on both sides of the stairs and use them. Fix handrails that are broken or loose. Make sure that handrails are as long as the stairways. Check any carpeting to make sure that it is firmly attached to the stairs. Fix any carpet that is loose or worn. Avoid having throw rugs at the top or bottom of the stairs. If you do have throw rugs, attach  them to the floor with carpet tape. Make sure that you have a light switch at the top of the stairs and the bottom of the stairs. If you do not have them, ask someone to add them for you. What else can I do to help prevent falls? Wear shoes that: Do not have high heels. Have rubber bottoms. Are comfortable and fit you well. Are closed at the toe. Do not wear sandals. If you use a stepladder: Make sure that it is fully opened. Do not climb a closed stepladder. Make sure that both sides of the stepladder are locked into place. Ask someone to hold it for you, if possible. Clearly mark and make sure that you can see: Any grab bars or handrails. First and last steps. Where the edge of each step is. Use tools that help you move  around (mobility aids) if they are needed. These include: Canes. Walkers. Scooters. Crutches. Turn on the lights when you go into a dark area. Replace any light bulbs as soon as they burn out. Set up your furniture so you have a clear path. Avoid moving your furniture around. If any of your floors are uneven, fix them. If there are any pets around you, be aware of where they are. Review your medicines with your doctor. Some medicines can make you feel dizzy. This can increase your chance of falling. Ask your doctor what other things that you can do to help prevent falls. This information is not intended to replace advice given to you by your health care provider. Make sure you discuss any questions you have with your health care provider. Document Released: 06/21/2009 Document Revised: 01/31/2016 Document Reviewed: 09/29/2014 Elsevier Interactive Patient Education  2017 [employer].

## 2021-05-14 NOTE — Progress Notes (Signed)
Subjective:   Jackson Perry is a 75 y.o. male who presents for Medicare Annual/Subsequent preventive examination.  Review of Systems     Cardiac Risk Factors include: advanced age (>25mn, >>75women);hypertension;dyslipidemia;male gender     Objective:    There were no vitals filed for this visit. There is no height or weight on file to calculate BMI.  Advanced Directives 05/14/2021 03/13/2021 09/13/2020 11/22/2019 10/19/2018  Does Patient Have a Medical Advance Directive? Yes Yes Yes Yes Yes  Type of AParamed[place]of [place] will;Out of facility DNR (pink MOST or yellow form) [place] (pink MOST or yellow form);Living will [place] will Living will;Healthcare Power of Attorney  Does patient want to make changes to medical advance directive? No - Patient declined No - Patient declined No - Patient declined Yes (ED - Information included in AVS) No - Patient declined  Copy of [place] Chart? - No - copy requested No - copy requested No - copy requested No - copy requested    Current Medications (verified) Outpatient Encounter Medications as of 05/14/2021  Medication Sig   ALPRAZolam (XANAX) 1 MG tablet Take 1 tablet (1 mg total) by mouth at bedtime as needed for anxiety.   aspirin EC 81 MG tablet Take 1 tablet (81 mg total) by mouth daily. Swallow whole.   atorvastatin (LIPITOR) 40 MG tablet Take 1 tablet (40 mg total) by mouth daily.   betamethasone dipropionate 0.05 % cream Apply topically 2 (two) times daily. (Patient taking differently: Apply topically 2 (two) times daily as needed.)   Cholecalciferol (VITAMIN D) 50 MCG (2000 UT) CAPS Take 1 capsule (2,000 Units total) by mouth daily.   cyanocobalamin (,VITAMIN B-12,) 1000 MCG/ML injection Inject 1 mL every 14 days   Ferrous Sulfate (IRON) 325 (65 Fe) MG TABS Take 1 tablet by mouth daily.    hydrochlorothiazide (HYDRODIURIL) 25 MG tablet Take 1 tablet (25 mg total) by mouth daily.   Syringe/Needle, Disp, (SYRINGE 3CC/25GX1") 25G X 1" 3 ML MISC Use monthly for b12 injections   [DISCONTINUED] varenicline (CHANTIX STARTING MONTH PAK) 0.5 MG X 11 & 1 MG X 42 tablet Take one 0.5 mg tablet by mouth once daily for 3 days, then increase to one 0.5 mg tablet twice daily for 4 days, then increase to one 1 mg tablet twice daily.   No facility-administered encounter medications on file as of 05/14/2021.    Allergies (verified) Bupropion hcl and Shrimp [shellfish allergy]   History: Past Medical History:  Diagnosis Date   Anemia    Arthritis    Depression    High blood pressure    High cholesterol    Panic attacks    [place]state [place] ([place] 10/14[place]   Past Surgical History:  Procedure Laterality Date   CATARACT EXTRACTION     CHOLECYSTECTOMY     HERNIA REPAIR     Family History  Problem Relation Age of Onset   Heart disease Father        876  Diabetes Father    Cancer Father    Social History   Socioeconomic History   Marital status: Married    Spouse name: Not on file   Number of children: Not on file   Years of education: Not on file   Highest education level: Not on file  Occupational History   Not on file  Tobacco Use  Smoking status: Some Days    Types: Cigarettes    Last attempt to quit: 11/05/2003    Years since quitting: 17.5   Smokeless tobacco: Never   Tobacco comments:    6 per day   Vaping Use   Vaping Use: Never used  Substance and Sexual Activity   Alcohol use: Yes   Drug use: Never   Sexual activity: Not Currently  Other Topics Concern   Not on file  Social History Narrative   Diet: Blank      Do you drink/ eat things with caffeine?  Coffee      Marital status:    M                           What year were you married ? 1969      Do you live in a house, apartment,assistred living, condo, trailer, etc.)? Condo      Is it one or more  stories? 1      How many persons live in your home ? 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   Yes                           Type & how often 1 and 1/2- 2 mile brisk walk      ADVANCED DIRECTIVES (Please bring copies)      Do you have a living will? Yes      Do you have a DNR form?  Yes                     If not, do you want to discuss one?       Do you have signed POA?HPOA forms?   Yes              If so, please bring to your appointment      FUNCTIONAL STATUS- To be completed by Spouse / child / Staff       Do you have difficulty bathing or dressing yourself ?  No      Do you have difficulty preparing food or eating ? No      Do you have difficulty managing your mediation ? No      Do you have difficulty managing your finances ? No      Do you have difficulty affording your medication ? No      Social Determinants of Radio broadcast assistant Strain: Not on file  Food Insecurity: Not on file  Transportation Needs: Not on file  Physical Activity: Not on file  Stress: Not on file  Social Connections: Not on file    Tobacco Counseling Ready to quit: Not Answered Counseling given: Not Answered Tobacco comments: 6 per day    Clinical Intake:  Pre-visit preparation completed: Yes  Pain : No/denies pain     BMI - recorded: 22.75 Nutritional Risks: None Diabetes: No  How often do you need to have someone help you when you read instructions, pamphlets, or other written materials from your doctor or pharmacy?: 1 - Never  Diabetic?no  Activities of Daily Living In your present state of health, do you have any difficulty performing the following activities: 05/14/2021  Hearing? N  Vision? N  Difficulty concentrating or making decisions? N  Walking or climbing stairs? N  Dressing or  bathing? N  Doing errands, shopping? N  Preparing Food and eating ? N  Using the Toilet? N  In the past six months, have you accidently leaked urine? N  Do you have problems with loss of bowel control? N  Managing your Medications? N  Managing your Finances? N  Some recent data might be hidden    Patient Care Team: [name], NP as PCP - General (Geriatric Medicine) Sable Feil, MD as Consulting Physician (Gastroenterology) Michael Boston, MD as Consulting Physician (General Surgery) Ceasar Mons, MD as Consulting Physician (Urology)  Indicate any recent [place] you may have received from other than [facility] providers in the past year (date may be approximate).     Assessment:   This is a routine wellness examination for Jackson Perry.  Hearing/Vision screen Hearing Screening - Comments:: Patient has no hearing problems Vision Screening - Comments:: Patient has no vision problems. Patient has had eye exam. Dr. Valetta Close  Dietary issues and exercise activities discussed: Current Exercise Habits: Home exercise routine, Type of exercise: walking, Time (Minutes): 50, Frequency (Times/Week): 7, Weekly Exercise (Minutes/Week): 350   Goals Addressed   None    Depression Screen PHQ 2/9 Scores 05/14/2021 09/13/2020 11/22/2019 08/15/2019 02/16/2019 11/19/2018 10/19/2018  PHQ - 2 Score 0 2 0 0 0 0 0  PHQ- 9 Score - 4 - - - - -    Fall Risk Fall Risk  05/14/2021 09/13/2020 02/14/2020 11/22/2019 08/15/2019  Falls in the past year? 0 '1 1 1 '$ 0  Number falls in past yr: 0 0 0 0 0  Comment - busted face open and may have done something to his neck - - -  Injury with Fall? 0 0 1 0 0  Risk for fall due to : No Fall Risks - - - -  Follow up Falls evaluation completed - - Falls evaluation completed;Education provided -    FALL RISK PREVENTION PERTAINING TO THE HOME:  Any stairs in or around the home? No  If so, are there any without handrails? No  Home free of loose throw rugs in  walkways, pet beds, electrical cords, etc? Yes  Adequate lighting in your home to reduce risk of falls? Yes   ASSISTIVE DEVICES UTILIZED TO PREVENT FALLS:  Life alert? No  Use of a cane, walker or w/c? No  Grab bars in the bathroom? Yes  Shower chair or bench in shower? Yes  Elevated toilet seat or a handicapped toilet? No   TIMED UP AND GO:  Was the test performed? No .    Cognitive Function:     6CIT Screen 05/14/2021 11/22/2019 10/19/2018  What Year? 0 points 0 points 0 points  What month? 0 points 0 points 0 points  What time? 0 points (No Data) 0 points  Count back from 20 0 points 0 points 0 points  Months in reverse 0 points 0 points 0 points  Repeat phrase 4 points 0 points 0 points  Total Score 4 - 0    Immunizations Immunization History  Administered Date(s) Administered   Fluad Quad(high Dose 65+) 04/29/2019   Influenza, High Dose Seasonal PF 07/26/2018, 06/06/2020   Influenza,inj,Quad PF,6+ Mos 07/10/2015, 05/22/2016, 07/25/2017   PFIZER(Purple Top)SARS-COV-2 Vaccination 09/27/2019, 10/18/2019,  05/12/2020, 10/25/2020   Pneumococcal Conjugate-13 07/03/2016   Pneumococcal Polysaccharide-23 02/04/2013   Tdap 03/28/2020   Tetanus 02/04/2013    TDAP status: Up to date  Flu Vaccine status: Due, Education has been provided regarding the importance of this vaccine. Advised may receive this vaccine at local pharmacy or Health Dept. Aware to provide a copy of the vaccination record if obtained from local pharmacy or Health Dept. Verbalized acceptance and understanding.  Pneumococcal vaccine status: Up to date  Covid-19 vaccine status: Completed vaccines  Qualifies for Shingles Vaccine? Yes   Zostavax completed No   Shingrix Completed?: Yes  Screening Tests Health Maintenance  Topic Date Due   Zoster Vaccines- Shingrix (1 of 2) Never done   COVID-19 Vaccine (5 - Booster for Pfizer series) 02/22/2021   INFLUENZA VACCINE  04/08/2021   TETANUS/TDAP  03/28/2030    Hepatitis C Screening  Completed   PNA vac Low Risk Adult  Completed   HPV VACCINES  Aged Out    Health Maintenance  Health Maintenance Due  Topic Date Due   Zoster Vaccines- Shingrix (1 of 2) Never done   COVID-19 Vaccine (5 - Booster for Pfizer series) 02/22/2021   INFLUENZA VACCINE  04/08/2021    Colorectal cancer screening: No longer required.   Lung Cancer Screening: (Low Dose CT Chest recommended if Age 21-80 years, 30 pack-year currently smoking OR have quit w/in 15years.) does not qualify.   Lung Cancer Screening Referral: na  Additional Screening:  Hepatitis C Screening: does qualify, completed  Vision Screening: Recommended annual ophthalmology exams for early detection of glaucoma and other disorders of the eye. Is the patient up to date with their annual eye exam?  Yes  Who is the provider or what is the name of the office in which the patient attends annual eye exams? Dr Valetta Close If pt is not established with a provider, would they like to be referred to a provider to establish care? No .   Dental Screening: Recommended annual dental exams for proper oral hygiene  Community Resource Referral / Chronic Care Management: CRR required this visit?  No   CCM required this visit?  No      Plan:     I have personally reviewed and noted the following in the patient's chart:   Medical and social history Use of alcohol, tobacco or illicit drugs  Current medications and supplements including opioid prescriptions. Patient is not currently taking opioid prescriptions. Functional ability and status Nutritional status Physical activity Advanced directives List of other physicians Hospitalizations, surgeries, and ER visits in previous 12 months Vitals Screenings to include cognitive, depression, and falls Referrals and appointments  In addition, I have reviewed and discussed with patient certain preventive protocols, quality metrics, and best practice recommendations.  A written personalized care plan for preventive services as well as general preventive health recommendations were provided to patient.     [name], NP   05/14/2021    Virtual Visit via Telephone Note  I connected withNAME@ on 05/14/21 at  2:15 PM EDT by telephone and verified that I am speaking with the correct person using two identifiers.  Location: Patient: home Provider: [facility]    I discussed the limitations, risks, security and privacy concerns of performing an evaluation and management service by telephone and the availability of in person appointments. I also discussed with the patient that there may be a patient responsible charge related to this service. The patient expressed understanding and agreed to proceed.  I discussed the assessment and treatment plan with the patient. The patient was provided an opportunity to ask questions and all were answered. The patient agreed with the plan and demonstrated an understanding of the instructions.   The patient was advised to call back or seek an in-person evaluation if the symptoms worsen or if the condition fails to improve as anticipated.  I provided 18 minutes of non-face-to-face time during this encounter.  Carlos American. Harle Battiest Avs printed and mailed

## 2021-09-16 ENCOUNTER — Encounter: Payer: Self-pay | Admitting: Nurse Practitioner

## 2021-09-16 ENCOUNTER — Telehealth: Payer: Self-pay

## 2021-09-16 ENCOUNTER — Other Ambulatory Visit: Payer: Self-pay

## 2021-09-16 ENCOUNTER — Ambulatory Visit (INDEPENDENT_AMBULATORY_CARE_PROVIDER_SITE_OTHER): Payer: Medicare Other | Admitting: Nurse Practitioner

## 2021-09-16 VITALS — BP 136/84 | HR 78 | Temp 97.1°F | Ht 66.0 in | Wt 153.0 lb

## 2021-09-16 DIAGNOSIS — I10 Essential (primary) hypertension: Secondary | ICD-10-CM

## 2021-09-16 DIAGNOSIS — E782 Mixed hyperlipidemia: Secondary | ICD-10-CM | POA: Diagnosis not present

## 2021-09-16 DIAGNOSIS — F5104 Psychophysiologic insomnia: Secondary | ICD-10-CM

## 2021-09-16 DIAGNOSIS — R5383 Other fatigue: Secondary | ICD-10-CM | POA: Diagnosis not present

## 2021-09-16 DIAGNOSIS — D509 Iron deficiency anemia, unspecified: Secondary | ICD-10-CM

## 2021-09-16 DIAGNOSIS — F17200 Nicotine dependence, unspecified, uncomplicated: Secondary | ICD-10-CM

## 2021-09-16 DIAGNOSIS — C61 Malignant neoplasm of prostate: Secondary | ICD-10-CM

## 2021-09-16 DIAGNOSIS — E538 Deficiency of other specified B group vitamins: Secondary | ICD-10-CM | POA: Diagnosis not present

## 2021-09-16 DIAGNOSIS — L309 Dermatitis, unspecified: Secondary | ICD-10-CM

## 2021-09-16 DIAGNOSIS — R739 Hyperglycemia, unspecified: Secondary | ICD-10-CM

## 2021-09-16 MED ORDER — ALPRAZOLAM 0.5 MG PO TABS: 0.2500 mg | ORAL_TABLET | Freq: Every evening | ORAL | 5 refills | Status: DC | PRN

## 2021-09-16 MED ORDER — "SYRINGE 25G X 1"" 3 ML MISC": 1.0000 | 0 refills | Status: DC

## 2021-09-16 MED ORDER — BETAMETHASONE DIPROPIONATE 0.05 % EX CREA: TOPICAL_CREAM | Freq: Two times a day (BID) | CUTANEOUS | 3 refills | Status: DC

## 2021-09-16 MED ORDER — ALPRAZOLAM 0.5 MG PO TABS: 0.2500 mg | ORAL_TABLET | Freq: Every evening | ORAL | Status: DC | PRN

## 2021-09-16 NOTE — Patient Instructions (Signed)
To get shingrix at local pharmacy

## 2021-09-16 NOTE — Telephone Encounter (Signed)
I called [facility] on [address] and left a detailed message requesting to cancel rx's sent over today.

## 2021-09-16 NOTE — Progress Notes (Signed)
Careteam: Patient Care Team: [name], NP as PCP - General (Geriatric Medicine) Sable Feil, MD as Consulting Physician (Gastroenterology) Michael Boston, MD as Consulting Physician (General Surgery) Ceasar Mons, MD as Consulting Physician (Urology)  PLACE OF SERVICE:  [place]  Advanced Directive information    Allergies  Allergen Reactions   Bupropion Hcl    Shrimp [Shellfish Allergy]     Chief Complaint  Patient presents with   Medical Management of Chronic Issues    6 month follow-up. Discuss need for shingrix or post pone if patient refuses. Discuss b12 frequency and update rx to reflect specifics.      HPI: Patient is a 76 y.o. male for follow up  Vit b12- doing injection every 10-14 days. Generally every 2 weeks.   Iron def anemia- stopped iron tablets due to GI upset.   Insomnia- continues to use xanax 1 mg by mouth at bedtime. Has not tried to cut back. Continues to smoke, has cocktails in the evening.   Smoking- has not cut back. 1 with coffee and 1 with cocktails at night.   Prostate cancer- opted for surveillance at this time. Continues to follow up with urologist. No problems with urination or urinary symptoms.    Review of Systems:  Review of Systems  Constitutional:  Positive for malaise/fatigue. Negative for chills, fever and weight loss.  HENT:  Negative for tinnitus.   Respiratory:  Negative for cough, sputum production and shortness of breath.   Cardiovascular:  Negative for chest pain, palpitations and leg swelling.  Gastrointestinal:  Negative for abdominal pain, constipation, diarrhea and heartburn.  Genitourinary:  Negative for dysuria, frequency and urgency.  Musculoskeletal:  Negative for back pain, falls, joint pain and myalgias.  Skin: Negative.   Neurological:  Negative for dizziness and headaches.  Psychiatric/Behavioral:  Negative for depression and memory loss. The patient has insomnia.    Past  Medical History:  Diagnosis Date   Anemia    Arthritis    Depression    High blood pressure    High cholesterol    Panic attacks    Prostate cancer ([place]) 10/14/2018   Past Surgical History:  Procedure Laterality Date   CATARACT EXTRACTION     CHOLECYSTECTOMY     HERNIA REPAIR     Social History:   reports that he has been smoking cigarettes. He has never used smokeless tobacco. He reports current alcohol use. He reports that he does not use drugs.  Family History  Problem Relation Age of Onset   Heart disease Father        72   Diabetes Father    Cancer Father     Medications: Patient's Medications  New Prescriptions   No medications on file  Previous Medications   ALPRAZOLAM (XANAX) 1 MG TABLET    Take 1 tablet (1 mg total) by mouth at bedtime as needed for anxiety.   ASPIRIN EC 81 MG TABLET    Take 1 tablet (81 mg total) by mouth daily. Swallow whole.   ATORVASTATIN (LIPITOR) 40 MG TABLET    Take 1 tablet (40 mg total) by mouth daily.   BETAMETHASONE DIPROPIONATE 0.05 % CREAM    Apply topically 2 (two) times daily.   CHOLECALCIFEROL (VITAMIN D) 50 MCG (2000 UT) CAPS    Take 1 capsule (2,000 Units total) by mouth daily.   CYANOCOBALAMIN (,VITAMIN B-12,) 1000 MCG/ML INJECTION    Inject 1 mL every 14 days   FERROUS  SULFATE (IRON) 325 (65 FE) MG TABS    Take 1 tablet by mouth daily.   HYDROCHLOROTHIAZIDE (HYDRODIURIL) 25 MG TABLET    Take 1 tablet (25 mg total) by mouth daily.   SYRINGE/NEEDLE, DISP, (SYRINGE 3CC/25GX1") 25G X 1" 3 ML MISC    Use monthly for b12 injections  Modified Medications   No medications on file  Discontinued Medications   No medications on file    Physical Exam:  Vitals:   09/16/21 0827  BP: 136/84  Pulse: 78  Temp: (!) 97.1 F (36.2 C)  TempSrc: Temporal  SpO2: 99%  Weight: 153 lb (69.4 kg)  Height: 5' 6"  (1.676 m)   Body mass index is 24.69 kg/m. Wt Readings from Last 3 Encounters:  09/16/21 153 lb (69.4 kg)  03/13/21 149 lb 9.6  oz (67.9 kg)  09/19/20 147 lb (66.7 kg)    Physical Exam Constitutional:      General: He is not in acute distress.    Appearance: He is well-developed. He is not diaphoretic.  HENT:     Head: Normocephalic and atraumatic.     Right Ear: External ear normal.     Left Ear: External ear normal.     Mouth/Throat:     Pharynx: No oropharyngeal exudate.  Eyes:     Conjunctiva/sclera: Conjunctivae normal.     Pupils: Pupils are equal, round, and reactive to light.  Cardiovascular:     Rate and Rhythm: Normal rate and regular rhythm.     Heart sounds: Normal heart sounds.  Pulmonary:     Effort: Pulmonary effort is normal.     Breath sounds: Normal breath sounds.  Abdominal:     General: Bowel sounds are normal.     Palpations: Abdomen is soft.  Musculoskeletal:        General: No tenderness.     Cervical back: Normal range of motion and neck supple.     Right lower leg: No edema.     Left lower leg: No edema.  Skin:    General: Skin is warm and dry.  Neurological:     Mental Status: He is alert and oriented to person, place, and time.    Labs reviewed: Basic Metabolic Panel: Recent Labs    09/17/20 0817 03/13/21 0921  NA 141 139  K 4.5 4.3  CL 105 104  CO2 27 27  GLUCOSE 105* 89  BUN 17 15  CREATININE 1.00 1.03  CALCIUM 9.5 9.5   Liver Function Tests: Recent Labs    09/17/20 0817 03/13/21 0921  AST 23 20  ALT 24 18  BILITOT 0.5 0.4  PROT 6.7 6.6   No results for input(s): LIPASE, AMYLASE in the last 8760 hours. No results for input(s): AMMONIA in the last 8760 hours. CBC: Recent Labs    09/17/20 0817 03/13/21 0921 03/22/21 0816  WBC 7.5 7.9 8.7  NEUTROABS 5,385 5,664 6,569  HGB 13.0* 11.8* 11.9*  HCT 40.7 37.6* 38.3*  MCV 87.3 84.1 85.1  PLT 288 288 280   Lipid Panel: Recent Labs    09/17/20 0817  CHOL 130  HDL 53  LDLCALC 64  TRIG 45  CHOLHDL 2.5   TSH: No results for input(s): TSH in the last 8760 hours. A1C: Lab Results   Component Value Date   HGBA1C 5.5 03/13/2021     Assessment/Plan 1. Vitamin B12 deficiency -continues on injection every 2 weeks.  - Vitamin B12 - Syringe/Needle, Disp, (SYRINGE 3CC/25GX1") 25G X 1" 3  ML MISC; Inject 1 Device into the muscle every 14 (fourteen) days. Use monthly for b12 injections E53.8  Dispense: 50 each; Refill: 0  2. Iron deficiency anemia, unspecified iron deficiency anemia type -continues on supplement.  - Iron, TIBC and Ferritin Panel - CBC with Differential/Platelet  3. Mixed hyperlipidemia -continues on lipitor with dietary modifications.  - CMP with eGFR([facility]) - Lipid Panel  4. Essential hypertension -Blood pressure well controlled Continue current medications Recheck metabolic panel  5. Hyperglycemia -continue dietary modifications and increase in activity  6. Smoker -encouraged cessation.   7. Fatigue, unspecified type -likely multifactorial. Encouraged cutting back from alprazolam which could be contributing as well as ETOH use.  -will follow up labs to make sure  - TSH  8. Prostate cancer [facility]) -surveillance by urology at this time   9. Eczema, unspecified type Worse in the winter months. - betamethasone dipropionate 0.05 % cream; Apply topically 2 (two) times daily.  Dispense: 30 g; Refill: 3  10. Psychophysiological insomnia -will have him reduce xanax to 0.5 mg daily at bedtime at this time. Suspect some dependency to this since he has been taking for sometime. Could be contributing to daytime fatigue and would like to see him decrease dose and ultimately stop.  -he has been given information to help sleep as well. Cessation of ETOH and smoking prior to bedtime with sleep routine encouraged.  - ALPRAZolam (XANAX) 0.5 MG tablet; Take 0.5-1 tablets (0.25-0.5 mg total) by mouth at bedtime as needed for anxiety.  Dispense: 30 tablet; Refill: 5    Next appt: 6 months, labs at appt [place]. [place], [place]  [facility] [phone]

## 2021-09-17 LAB — COMPLETE METABOLIC PANEL WITH GFR
AG Ratio: 1.8 (calc) (ref 1.0–2.5)
ALT: 26 U/L (ref 9–46)
AST: 25 U/L (ref 10–35)
Albumin: 4.2 g/dL (ref 3.6–5.1)
Alkaline phosphatase (APISO): 64 U/L (ref 35–144)
BUN: 13 mg/dL (ref 7–25)
CO2: 28 mmol/L (ref 20–32)
Calcium: 9.3 mg/dL (ref 8.6–10.3)
Chloride: 104 mmol/L (ref 98–110)
Creat: 0.97 mg/dL (ref 0.70–1.28)
Globulin: 2.3 g/dL (calc) (ref 1.9–3.7)
Glucose, Bld: 94 mg/dL (ref 65–99)
Potassium: 4.1 mmol/L (ref 3.5–5.3)
Sodium: 142 mmol/L (ref 135–146)
Total Bilirubin: 0.6 mg/dL (ref 0.2–1.2)
Total Protein: 6.5 g/dL (ref 6.1–8.1)
eGFR: 81 mL/min/{1.73_m2} (ref >=60)

## 2021-09-17 LAB — TSH: TSH: 2.65 mIU/L (ref 0.40–4.50)

## 2021-09-17 LAB — CBC WITH DIFFERENTIAL/PLATELET
Absolute Monocytes: 566 cells/uL (ref 200–950)
Basophils Absolute: 39 cells/uL (ref 0–200)
Basophils Relative: 0.6 %
Eosinophils Absolute: 39 cells/uL (ref 15–500)
Eosinophils Relative: 0.6 %
HCT: 45.6 % (ref 38.5–50.0)
Hemoglobin: 15.2 g/dL (ref 13.2–17.1)
Lymphs Abs: 1053 cells/uL (ref 850–3900)
MCH: 31.2 pg (ref 27.0–33.0)
MCHC: 33.3 g/dL (ref 32.0–36.0)
MCV: 93.6 fL (ref 80.0–100.0)
MPV: 10.9 fL (ref 7.5–12.5)
Monocytes Relative: 8.7 %
Neutro Abs: 4804 cells/uL (ref 1500–7800)
Neutrophils Relative %: 73.9 %
Platelets: 199 10*3/uL (ref 140–400)
RBC: 4.87 10*6/uL (ref 4.20–5.80)
RDW: 12.7 % (ref 11.0–15.0)
Total Lymphocyte: 16.2 %
WBC: 6.5 10*3/uL (ref 3.8–10.8)

## 2021-09-17 LAB — LIPID PANEL
Cholesterol: 135 mg/dL (ref <200)
HDL: 46 mg/dL (ref >=40)
LDL Cholesterol (Calc): 74 mg/dL (calc)
Non-HDL Cholesterol (Calc): 89 mg/dL (calc) (ref <130)
Total CHOL/HDL Ratio: 2.9 (calc) (ref <5.0)
Triglycerides: 71 mg/dL (ref <150)

## 2021-09-17 LAB — VITAMIN B12: Vitamin B-12: 490 pg/mL (ref 200–1100)

## 2021-09-17 LAB — IRON,TIBC AND FERRITIN PANEL
%SAT: 22 % (calc) (ref 20–48)
Ferritin: 35 ng/mL (ref 24–380)
Iron: 73 ug/dL (ref 50–180)
TIBC: 327 mcg/dL (calc) (ref 250–425)

## 2021-09-18 ENCOUNTER — Ambulatory Visit: Payer: Medicare Other | Admitting: Nurse Practitioner

## 2022-02-21 DIAGNOSIS — H524 Presbyopia: Secondary | ICD-10-CM | POA: Diagnosis not present

## 2022-03-10 ENCOUNTER — Encounter: Payer: Self-pay | Admitting: Nurse Practitioner

## 2022-03-10 ENCOUNTER — Ambulatory Visit (INDEPENDENT_AMBULATORY_CARE_PROVIDER_SITE_OTHER): Payer: Medicare Other | Admitting: Nurse Practitioner

## 2022-03-10 VITALS — BP 132/88 | HR 89 | Temp 97.3°F | Ht 66.0 in | Wt 147.0 lb

## 2022-03-10 DIAGNOSIS — C61 Malignant neoplasm of prostate: Secondary | ICD-10-CM

## 2022-03-10 DIAGNOSIS — I10 Essential (primary) hypertension: Secondary | ICD-10-CM | POA: Diagnosis not present

## 2022-03-10 DIAGNOSIS — E538 Deficiency of other specified B group vitamins: Secondary | ICD-10-CM | POA: Diagnosis not present

## 2022-03-10 DIAGNOSIS — E782 Mixed hyperlipidemia: Secondary | ICD-10-CM

## 2022-03-10 DIAGNOSIS — F17200 Nicotine dependence, unspecified, uncomplicated: Secondary | ICD-10-CM

## 2022-03-10 DIAGNOSIS — D509 Iron deficiency anemia, unspecified: Secondary | ICD-10-CM | POA: Diagnosis not present

## 2022-03-10 DIAGNOSIS — F5104 Psychophysiologic insomnia: Secondary | ICD-10-CM

## 2022-03-10 MED ORDER — CYANOCOBALAMIN 1000 MCG/ML IJ SOLN: INTRAMUSCULAR | 3 refills | Status: DC

## 2022-03-10 MED ORDER — "SYRINGE 25G X 1"" 3 ML MISC": 1.0000 | 0 refills | Status: DC

## 2022-03-10 MED ORDER — LOSARTAN POTASSIUM 25 MG PO TABS: 25.0000 mg | ORAL_TABLET | Freq: Every day | ORAL | 1 refills | Status: DC

## 2022-03-10 MED ORDER — HYDROCHLOROTHIAZIDE 25 MG PO TABS: 25.0000 mg | ORAL_TABLET | Freq: Every day | ORAL | 3 refills | Status: DC

## 2022-03-10 MED ORDER — ATORVASTATIN CALCIUM 40 MG PO TABS: 40.0000 mg | ORAL_TABLET | Freq: Every day | ORAL | 3 refills | Status: DC

## 2022-03-10 NOTE — Progress Notes (Signed)
Careteam: Patient Care Team: [name], NP as PCP - General (Geriatric Medicine) Sable Feil, MD as Consulting Physician (Gastroenterology) Michael Boston, MD as Consulting Physician (General Surgery) Ceasar Mons, MD as Consulting Physician (Urology)  PLACE OF SERVICE:  [place] Directive information Does Patient Have a Medical Advance Directive?: Yes, Type of Advance Directive: [place], Does patient want to make changes to medical advance directive?: No - Patient declined  Allergies  Allergen Reactions   Bupropion Hcl    Shrimp [Shellfish Allergy]     Chief Complaint  Patient presents with   Medical Management of Chronic Issues    6 month follow-up and fasting labs. Discuss need for shingrix or post pone if patient refuses. [facility] verified. Patient denies receiving any vaccines since last visit. Patient stopped ASA and Iron supplement.       HPI: Patient is a 76 y.o. male for routine follow up.   Iron def anemia- not on supplement at this time, trying to eat well.   Prostate cancer- has not been back to urologist, symptoms have remained stable. No changes with urination. If something changes he plans to see urologist.  He watched his father go through prostate cancer treatment and he was miserable.    Continues to drink ETOH.   Reports his sleep is better. He has cut back considerable on xanax. Has gone weeks at times without taking. Overall feels better.   Has had teeth pulled and blood pressure was elevated- at home 170s   Review of Systems:  Review of Systems  Constitutional:  Negative for chills, fever and weight loss.  HENT:  Negative for tinnitus.   Respiratory:  Negative for cough, sputum production and shortness of breath.   Cardiovascular:  Negative for chest pain, palpitations and leg swelling.  Gastrointestinal:  Negative for abdominal pain, constipation, diarrhea and heartburn.   Genitourinary:  Negative for dysuria, frequency and urgency.  Musculoskeletal:  Negative for back pain, falls, joint pain and myalgias.  Skin: Negative.   Neurological:  Negative for dizziness and headaches.  Psychiatric/Behavioral:  Negative for depression and memory loss. The patient does not have insomnia.     Past Medical History:  Diagnosis Date   Anemia    Arthritis    Depression    High blood pressure    High cholesterol    Panic attacks    Prostate cancer ([place]) 10/14/2018   Past Surgical History:  Procedure Laterality Date   CATARACT EXTRACTION     CHOLECYSTECTOMY     HERNIA REPAIR     TOOTH EXTRACTION     Social History:   reports that he has been smoking cigarettes. He has never used smokeless tobacco. He reports current alcohol use. He reports that he does not use drugs.  Family History  Problem Relation Age of Onset   Heart disease Father        48   Diabetes Father    Cancer Father     Medications: Patient's Medications  New Prescriptions   No medications on file  Previous Medications   ALPRAZOLAM (XANAX) 0.5 MG TABLET    Take 0.5-1 tablets (0.25-0.5 mg total) by mouth at bedtime as needed for anxiety.   ATORVASTATIN (LIPITOR) 40 MG TABLET    Take 1 tablet (40 mg total) by mouth daily.   BETAMETHASONE DIPROPIONATE 0.05 % CREAM    Apply topically 2 (two) times daily.   CHOLECALCIFEROL (VITAMIN D) 50 MCG (2000  UT) CAPS    Take 1 capsule (2,000 Units total) by mouth daily.   CYANOCOBALAMIN (,VITAMIN B-12,) 1000 MCG/ML INJECTION    Inject 1 mL every 14 days   HYDROCHLOROTHIAZIDE (HYDRODIURIL) 25 MG TABLET    Take 1 tablet (25 mg total) by mouth daily.   SYRINGE/NEEDLE, DISP, (SYRINGE 3CC/25GX1") 25G X 1" 3 ML MISC    Inject 1 Device into the muscle every 14 (fourteen) days. Use monthly for b12 injections E53.8  Modified Medications   No medications on file  Discontinued Medications   ASPIRIN EC 81 MG TABLET    Take 1 tablet (81 mg total) by mouth daily.  Swallow whole.   FERROUS SULFATE (IRON) 325 (65 FE) MG TABS    Take 1 tablet by mouth daily.    Physical Exam:  Vitals:   03/10/22 0802  BP: 132/88  Pulse: 89  Temp: (!) 97.3 F (36.3 C)  TempSrc: Temporal  SpO2: 98%  Weight: 147 lb (66.7 kg)  Height: 5' 6"  (1.676 m)   Body mass index is 23.73 kg/m. Wt Readings from Last 3 Encounters:  03/10/22 147 lb (66.7 kg)  09/16/21 153 lb (69.4 kg)  03/13/21 149 lb 9.6 oz (67.9 kg)    Physical Exam Constitutional:      General: He is not in acute distress.    Appearance: He is well-developed. He is not diaphoretic.  HENT:     Head: Normocephalic and atraumatic.     Right Ear: External ear normal.     Left Ear: External ear normal.     Mouth/Throat:     Pharynx: No oropharyngeal exudate.  Eyes:     Conjunctiva/sclera: Conjunctivae normal.     Pupils: Pupils are equal, round, and reactive to light.  Cardiovascular:     Rate and Rhythm: Normal rate and regular rhythm.     Heart sounds: Normal heart sounds.  Pulmonary:     Effort: Pulmonary effort is normal.     Breath sounds: Normal breath sounds.  Abdominal:     General: Bowel sounds are normal.     Palpations: Abdomen is soft.  Musculoskeletal:        General: No tenderness.     Cervical back: Normal range of motion and neck supple.     Right lower leg: No edema.     Left lower leg: No edema.  Skin:    General: Skin is warm and dry.  Neurological:     Mental Status: He is alert and oriented to person, place, and time.     Labs reviewed: Basic Metabolic Panel: Recent Labs    03/13/21 0921 09/16/21 0850  NA 139 142  K 4.3 4.1  CL 104 104  CO2 27 28  GLUCOSE 89 94  BUN 15 13  CREATININE 1.03 0.97  CALCIUM 9.5 9.3  TSH  --  2.65   Liver Function Tests: Recent Labs    03/13/21 0921 09/16/21 0850  AST 20 25  ALT 18 26  BILITOT 0.4 0.6  PROT 6.6 6.5   No results for input(s): "LIPASE", "AMYLASE" in the last 8760 hours. No results for input(s):  "AMMONIA" in the last 8760 hours. CBC: Recent Labs    03/13/21 0921 03/22/21 0816 09/16/21 0850  WBC 7.9 8.7 6.5  NEUTROABS 5,664 6,569 4,804  HGB 11.8* 11.9* 15.2  HCT 37.6* 38.3* 45.6  MCV 84.1 85.1 93.6  PLT 288 280 199   Lipid Panel: Recent Labs    09/16/21 0850  CHOL 135  HDL 46  LDLCALC 74  TRIG 71  CHOLHDL 2.9   TSH: Recent Labs    09/16/21 0850  TSH 2.65   A1C: Lab Results  Component Value Date   HGBA1C 5.5 03/13/2021     Assessment/Plan .1. Vitamin B12 deficiency -stable on last labs, continues on b12 - cyanocobalamin (,VITAMIN B-12,) 1000 MCG/ML injection; Inject 1 mL every 14 days  Dispense: 10 mL; Refill: 3 - Syringe/Needle, Disp, (SYRINGE 3CC/25GX1") 25G X 1" 3 ML MISC; Inject 1 Device into the muscle every 14 (fourteen) days. Use monthly for b12 injections E53.8  Dispense: 50 each; Refill: 0  2. Iron deficiency anemia, unspecified iron deficiency anemia type -no longer on supplement, continues on dietary modifications -follow up cbc  3. Mixed hyperlipidemia -continue dietary modifications.  - atorvastatin (LIPITOR) 40 MG tablet; Take 1 tablet (40 mg total) by mouth daily.  Dispense: 90 tablet; Refill: 3  4. Essential hypertension -elevated blood pressure at home and on recheck -to add losartan to hctz -dash diet given - CBC with Differential/Platelet - CMP with eGFR([facility]) - losartan (COZAAR) 25 MG tablet; Take 1 tablet (25 mg total) by mouth daily.  Dispense: 30 tablet; Refill: 1 - hydrochlorothiazide (HYDRODIURIL) 25 MG tablet; Take 1 tablet (25 mg total) by mouth daily.  Dispense: 90 tablet; Refill: 3  5. Smoker Encouraged cessation  6. Prostate cancer ([place]) -continues to monitor for increase in symptoms.  -discussed routine follow up with urologist for surveillance/progression but he will make appt if symptoms occur.  7. Psychophysiological insomnia -improved at this time.     Return in about 4 weeks (around 04/07/2022) for  blood pressure follow up. Carlos American. [place], [place] [phone]

## 2022-03-10 NOTE — Patient Instructions (Signed)

## 2022-03-11 LAB — CBC WITH DIFFERENTIAL/PLATELET
Absolute Monocytes: 550 cells/uL (ref 200–950)
Basophils Absolute: 20 cells/uL (ref 0–200)
Basophils Relative: 0.2 %
Eosinophils Absolute: 60 cells/uL (ref 15–500)
Eosinophils Relative: 0.6 %
HCT: 45.3 % (ref 38.5–50.0)
Hemoglobin: 14.9 g/dL (ref 13.2–17.1)
Lymphs Abs: 1150 cells/uL (ref 850–3900)
MCH: 30.5 pg (ref 27.0–33.0)
MCHC: 32.9 g/dL (ref 32.0–36.0)
MCV: 92.6 fL (ref 80.0–100.0)
MPV: 10.8 fL (ref 7.5–12.5)
Monocytes Relative: 5.5 %
Neutro Abs: 8220 cells/uL — ABNORMAL HIGH (ref 1500–7800)
Neutrophils Relative %: 82.2 %
Platelets: 269 10*3/uL (ref 140–400)
RBC: 4.89 10*6/uL (ref 4.20–5.80)
RDW: 12.8 % (ref 11.0–15.0)
Total Lymphocyte: 11.5 %
WBC: 10 10*3/uL (ref 3.8–10.8)

## 2022-03-11 LAB — COMPLETE METABOLIC PANEL WITH GFR
AG Ratio: 1.6 (calc) (ref 1.0–2.5)
ALT: 18 U/L (ref 9–46)
AST: 20 U/L (ref 10–35)
Albumin: 4.3 g/dL (ref 3.6–5.1)
Alkaline phosphatase (APISO): 82 U/L (ref 35–144)
BUN: 15 mg/dL (ref 7–25)
CO2: 27 mmol/L (ref 20–32)
Calcium: 9.5 mg/dL (ref 8.6–10.3)
Chloride: 103 mmol/L (ref 98–110)
Creat: 1.09 mg/dL (ref 0.70–1.28)
Globulin: 2.7 g/dL (calc) (ref 1.9–3.7)
Glucose, Bld: 96 mg/dL (ref 65–99)
Potassium: 4.1 mmol/L (ref 3.5–5.3)
Sodium: 139 mmol/L (ref 135–146)
Total Bilirubin: 0.6 mg/dL (ref 0.2–1.2)
Total Protein: 7 g/dL (ref 6.1–8.1)
eGFR: 70 mL/min/{1.73_m2} (ref >=60)

## 2022-03-12 ENCOUNTER — Telehealth: Payer: Self-pay

## 2022-03-12 NOTE — Telephone Encounter (Signed)
Patient wants to confirm whether the losartan is to replace his HCTZ or take in addition to.   Confirmed it is in addition to HCTZ according to 7/3 note.

## 2022-03-24 ENCOUNTER — Ambulatory Visit: Payer: Medicare Other | Admitting: Nurse Practitioner

## 2022-04-12 ENCOUNTER — Other Ambulatory Visit: Payer: Self-pay | Admitting: Nurse Practitioner

## 2022-04-12 DIAGNOSIS — F5104 Psychophysiologic insomnia: Secondary | ICD-10-CM

## 2022-04-14 ENCOUNTER — Encounter: Payer: Self-pay | Admitting: Nurse Practitioner

## 2022-04-14 ENCOUNTER — Ambulatory Visit (INDEPENDENT_AMBULATORY_CARE_PROVIDER_SITE_OTHER): Payer: Medicare Other | Admitting: Nurse Practitioner

## 2022-04-14 VITALS — BP 134/92 | HR 67 | Temp 97.3°F | Ht 66.0 in | Wt 149.0 lb

## 2022-04-14 DIAGNOSIS — C61 Malignant neoplasm of prostate: Secondary | ICD-10-CM

## 2022-04-14 DIAGNOSIS — I10 Essential (primary) hypertension: Secondary | ICD-10-CM

## 2022-04-14 DIAGNOSIS — E782 Mixed hyperlipidemia: Secondary | ICD-10-CM

## 2022-04-14 DIAGNOSIS — F419 Anxiety disorder, unspecified: Secondary | ICD-10-CM | POA: Diagnosis not present

## 2022-04-14 DIAGNOSIS — R42 Dizziness and giddiness: Secondary | ICD-10-CM | POA: Diagnosis not present

## 2022-04-14 DIAGNOSIS — D509 Iron deficiency anemia, unspecified: Secondary | ICD-10-CM

## 2022-04-14 DIAGNOSIS — F5104 Psychophysiologic insomnia: Secondary | ICD-10-CM | POA: Diagnosis not present

## 2022-04-14 MED ORDER — CITALOPRAM HYDROBROMIDE 10 MG PO TABS: 10.0000 mg | ORAL_TABLET | Freq: Every day | ORAL | 3 refills | Status: DC

## 2022-04-14 MED ORDER — ALPRAZOLAM 0.5 MG PO TABS: 0.2500 mg | ORAL_TABLET | Freq: Every evening | ORAL | 2 refills | Status: DC | PRN

## 2022-04-14 NOTE — Patient Instructions (Addendum)
Start half tablet of celexa daily for 2 weeks and then increase to whole tablet   Follow up in 4-6 weeks- can be virtual for bp and anxiety follow up.  Blood pressure goal <140/90.  To notify if blood pressure is staying higher with checking at home.  Check blood pressure 3-4 times week. Sitting down for at least 5 minutes Make sure you take your medication at least 1 hour before taking blood pressure

## 2022-04-14 NOTE — Progress Notes (Signed)
Careteam: Patient Care Team: [name], NP as PCP - General (Geriatric Medicine) Sable Feil, MD as Consulting Physician (Gastroenterology) Michael Boston, MD as Consulting Physician (General Surgery) Ceasar Mons, MD as Consulting Physician (Urology)  PLACE OF SERVICE:  [place]  Advanced Directive information    Allergies  Allergen Reactions   Bupropion Hcl    Shrimp [Shellfish Allergy]     Chief Complaint  Patient presents with   Follow-up    4 week b/p follow-up. Dizzy spells since last week. Refill alprazolam. Did not take B/P at home due to travels.      HPI: Patient is a 76 y.o. male who presents today for blood pressure follow up. He has not been checking his blood pressure at home due to recent travels. Blood pressure is elevated in office today on three different checks. Patient states he is in a constant state of anxiety and always worries about the worse in every situation. We talked about adding something daily for anxiety to help, instead of just using xanax PRN.   Dizzy spells have occurred twice in one week. Once when he was bending over to get his grandson out of the shower and once when he was outside gardening in the morning. Lasted a short time and then resolved.  Denies chest pain, dyspnea, palpitations, leg swelling.   States his diet is well. He avoids processed foods and does not add salt to his food. Continues to have about 2 drinks a night.   Denies any urologic symptoms.   States his sleep has not been well d/t being out of his xanax.   Review of Systems:  Review of Systems  Constitutional:  Negative for chills, fever and weight loss.  HENT:  Negative for congestion, ear pain and sinus pain.   Eyes:  Negative for blurred vision, double vision and photophobia.  Respiratory:  Negative for cough, shortness of breath and wheezing.   Cardiovascular:  Negative for chest pain, palpitations and leg swelling.   Gastrointestinal:  Negative for abdominal pain, constipation and diarrhea.  Genitourinary:  Negative for dysuria, hematuria and urgency.  Musculoskeletal:  Negative for falls.  Skin:  Negative for itching and rash.  Neurological:  Positive for dizziness. Negative for tingling and headaches.  Psychiatric/Behavioral:  Negative for depression. The patient is nervous/anxious and has insomnia.     Past Medical History:  Diagnosis Date   Anemia    Arthritis    Depression    High blood pressure    High cholesterol    Panic attacks    Prostate cancer ([place]) 10/14/2018   Past Surgical History:  Procedure Laterality Date   CATARACT EXTRACTION     CHOLECYSTECTOMY     HERNIA REPAIR     TOOTH EXTRACTION     Social History:   reports that he has been smoking cigarettes. He has never used smokeless tobacco. He reports current alcohol use. He reports that he does not use drugs.  Family History  Problem Relation Age of Onset   Heart disease Father        34   Diabetes Father    Cancer Father     Medications: Patient's Medications  New Prescriptions   No medications on file  Previous Medications   ALPRAZOLAM (XANAX) 0.5 MG TABLET    Take 0.5-1 tablets (0.25-0.5 mg total) by mouth at bedtime as needed for anxiety.   ATORVASTATIN (LIPITOR) 40 MG TABLET    Take 1 tablet (40  mg total) by mouth daily.   BETAMETHASONE DIPROPIONATE 0.05 % CREAM    Apply topically 2 (two) times daily.   CHOLECALCIFEROL (VITAMIN D) 50 MCG (2000 UT) CAPS    Take 1 capsule (2,000 Units total) by mouth daily.   CYANOCOBALAMIN (,VITAMIN B-12,) 1000 MCG/ML INJECTION    Inject 1 mL every 14 days   HYDROCHLOROTHIAZIDE (HYDRODIURIL) 25 MG TABLET    Take 1 tablet (25 mg total) by mouth daily.   LOSARTAN (COZAAR) 25 MG TABLET    Take 1 tablet (25 mg total) by mouth daily.   SYRINGE/NEEDLE, DISP, (SYRINGE 3CC/25GX1") 25G X 1" 3 ML MISC    Inject 1 Device into the muscle every 14 (fourteen) days. Use monthly for b12  injections E53.8  Modified Medications   No medications on file  Discontinued Medications   No medications on file    Physical Exam:  Vitals:   04/14/22 0802  BP: (!) 134/92  Pulse: 67  Temp: (!) 97.3 F (36.3 C)  TempSrc: Temporal  SpO2: 99%  Weight: 149 lb (67.6 kg)  Height: '5\' 6"'$  (1.676 m)   Body mass index is 24.05 kg/m. Wt Readings from Last 3 Encounters:  04/14/22 149 lb (67.6 kg)  03/10/22 147 lb (66.7 kg)  09/16/21 153 lb (69.4 kg)    Physical Exam Constitutional:      General: He is not in acute distress.    Appearance: He is not toxic-appearing.  HENT:     Right Ear: Ear canal and external ear normal. There is no impacted cerumen.     Left Ear: Tympanic membrane, ear canal and external ear normal. There is no impacted cerumen.     Nose: No congestion.     Mouth/Throat:     Mouth: Mucous membranes are moist.     Pharynx: Oropharynx is clear.  Eyes:     Conjunctiva/sclera: Conjunctivae normal.  Cardiovascular:     Rate and Rhythm: Normal rate and regular rhythm.     Pulses: Normal pulses.     Heart sounds: Normal heart sounds. No murmur heard. Pulmonary:     Effort: Pulmonary effort is normal. No respiratory distress.     Breath sounds: Normal breath sounds.  Abdominal:     General: Bowel sounds are normal.     Palpations: Abdomen is soft.     Tenderness: There is no abdominal tenderness. There is no guarding.  Musculoskeletal:        General: Normal range of motion.  Neurological:     Mental Status: He is alert and oriented to person, place, and time.  Psychiatric:        Mood and Affect: Mood normal.        Behavior: Behavior normal.     Labs reviewed: Basic Metabolic Panel: Recent Labs    09/16/21 0850 03/10/22 0851  NA 142 139  K 4.1 4.1  CL 104 103  CO2 28 27  GLUCOSE 94 96  BUN 13 15  CREATININE 0.97 1.09  CALCIUM 9.3 9.5  TSH 2.65  --    Liver Function Tests: Recent Labs    09/16/21 0850 03/10/22 0851  AST 25 20  ALT  26 18  BILITOT 0.6 0.6  PROT 6.5 7.0   No results for input(s): "LIPASE", "AMYLASE" in the last 8760 hours. No results for input(s): "AMMONIA" in the last 8760 hours. CBC: Recent Labs    09/16/21 0850 03/10/22 0851  WBC 6.5 10.0  NEUTROABS 4,804 8,220*  HGB 15.2  14.9  HCT 45.6 45.3  MCV 93.6 92.6  PLT 199 269   Lipid Panel: Recent Labs    09/16/21 0850  CHOL 135  HDL 46  LDLCALC 74  TRIG 71  CHOLHDL 2.9   TSH: Recent Labs    09/16/21 0850  TSH 2.65   A1C: Lab Results  Component Value Date   HGBA1C 5.5 03/13/2021     Assessment/Plan  1. Essential hypertension - BP goal less than 140/90 - recheck bloop pressure at home at least 3-4 times a week, after taking medication -low sodium diet - continue current regime for now - encouraged low-salt diet and alcohol cessation - follow-up in 4-6 weeks for bp check - BASIC METABOLIC PANEL WITH GFR   2. Psychophysiological insomnia - has been out of xanax and worsened recently with travel - will refill xanax in addition to adding celexa. Instructed pt to only take xanax if needed with hopes that celexa will do better in managing anxiety on a daily basis.  - ALPRAZolam (XANAX) 0.5 MG tablet; Take 0.5 tablets (0.25 mg total) by mouth at bedtime as needed for anxiety.  Dispense: 30 tablet; Refill: 2  3. Anxiety - constantly thinks the worse of every situation. States this has been a chronic problem for forever.  - start with 5 mg for one week then increase to 10 mg.  - citalopram (CELEXA) 10 MG tablet; Take 1 tablet (10 mg total) by mouth daily.  Dispense: 30 tablet; Refill: 3   4. Dizziness - encourage hydration - educated on slow position changes - advised to report back if re-occurring frequently  5. Mixed Hyperlipidemia - LDL 74, HDL 46, Triglycerides 71 (09/2021) - Continue Lipitor  6. IDA - Anemia profile WNL - Will continue to monitor   7. Prostate Cancer - Denies any increase in  symptoms   virtual follow-up in 6 weeks   Student- Waunita Schooner, RN I personally was present during the history, physical exam and medical decision-making activities of this service and have verified that the service and findings are accurately documented in the student's note Naeemah Jasmer K. [place], [facility] [phone]

## 2022-04-15 LAB — BASIC METABOLIC PANEL WITH GFR
BUN: 15 mg/dL (ref 7–25)
CO2: 28 mmol/L (ref 20–32)
Calcium: 9.1 mg/dL (ref 8.6–10.3)
Chloride: 103 mmol/L (ref 98–110)
Creat: 0.88 mg/dL (ref 0.70–1.28)
Glucose, Bld: 92 mg/dL (ref 65–99)
Potassium: 4.1 mmol/L (ref 3.5–5.3)
Sodium: 138 mmol/L (ref 135–146)
eGFR: 89 mL/min/{1.73_m2} (ref >=60)

## 2022-05-07 ENCOUNTER — Other Ambulatory Visit: Payer: Self-pay | Admitting: Nurse Practitioner

## 2022-05-07 DIAGNOSIS — I10 Essential (primary) hypertension: Secondary | ICD-10-CM

## 2022-05-16 ENCOUNTER — Encounter: Payer: Self-pay | Admitting: Nurse Practitioner

## 2022-05-19 ENCOUNTER — Telehealth: Payer: Self-pay

## 2022-05-19 ENCOUNTER — Encounter: Payer: Self-pay | Admitting: Nurse Practitioner

## 2022-05-19 ENCOUNTER — Telehealth: Payer: Medicare Other | Admitting: Nurse Practitioner

## 2022-05-19 VITALS — Ht 66.0 in

## 2022-05-19 DIAGNOSIS — I10 Essential (primary) hypertension: Secondary | ICD-10-CM

## 2022-05-19 DIAGNOSIS — L309 Dermatitis, unspecified: Secondary | ICD-10-CM

## 2022-05-19 DIAGNOSIS — R55 Syncope and collapse: Secondary | ICD-10-CM

## 2022-05-19 DIAGNOSIS — F419 Anxiety disorder, unspecified: Secondary | ICD-10-CM

## 2022-05-19 MED ORDER — BETAMETHASONE DIPROPIONATE 0.05 % EX CREA: TOPICAL_CREAM | Freq: Two times a day (BID) | CUTANEOUS | 11 refills | Status: DC

## 2022-05-19 MED ORDER — LOSARTAN POTASSIUM 50 MG PO TABS: 50.0000 mg | ORAL_TABLET | Freq: Every day | ORAL | 1 refills | Status: DC

## 2022-05-19 MED ORDER — ESCITALOPRAM OXALATE 10 MG PO TABS: 10.0000 mg | ORAL_TABLET | Freq: Every day | ORAL | 0 refills | Status: DC

## 2022-05-19 NOTE — Progress Notes (Signed)
   This service is provided via telemedicine  No vital signs collected/recorded due to the encounter was a telemedicine visit.   Location of patient (ex: home, work):  Home  Patient consents to a telephone visit: Yes, see telephone visit dated 05/19/22  Location of the provider (ex: office, home):  [facility], Office   Name of any referring provider:  N/A  Names of all persons participating in the telemedicine service and their role in the encounter:  S.Chrae B/CMA, Sherrie Mustache, NP, and Patient   Time spent on call:  9 min with medical assistant

## 2022-05-19 NOTE — Telephone Encounter (Signed)
[name], Jackson Perry are scheduled for a virtual visit with your provider today.    Just as we do with appointments in the office, we must obtain your consent to participate.  Your consent will be active for this visit and any virtual visit you may have with one of our providers in the next 365 days.    If you have a MyChart account, I can also send a copy of this consent to you electronically.  All virtual visits are billed to your insurance company just like a traditional visit in the office.  As this is a virtual visit, video technology does not allow for your provider to perform a traditional examination.  This may limit your provider's ability to fully assess your condition.  If your provider identifies any concerns that need to be evaluated in person or the need to arrange testing such as labs, EKG, etc, we will make arrangements to do so.    Although advances in technology are sophisticated, we cannot ensure that it will always work on either your end or our end.  If the connection with a video visit is poor, we may have to switch to a telephone visit.  With either a video or telephone visit, we are not always able to ensure that we have a secure connection.   I need to obtain your verbal consent now.   Are you willing to proceed with your visit today?   Jackson Perry has provided verbal consent on 05/19/2022 for a virtual visit (video or telephone).   Jackson Perry, [place] 05/19/2022  8:10 AM

## 2022-05-19 NOTE — Progress Notes (Signed)
Careteam: Patient Care Team: [name], NP as PCP - General (Geriatric Medicine) Sable Feil, MD as Consulting Physician (Gastroenterology) Michael Boston, MD as Consulting Physician (General Surgery) Ceasar Mons, MD as Consulting Physician (Urology)  Advanced Directive information Does Patient Have a Medical Advance Directive?: Yes, Type of Advance Directive: [place], Does patient want to make changes to medical advance directive?: No - Patient declined  Allergies  Allergen Reactions   Bupropion Hcl    Shrimp [Shellfish Allergy]     Chief Complaint  Patient presents with   Follow-up    4-6 week follow-up on blood pressure and anxiety. Patient does not think medication is working.      HPI: Patient is a 76 y.o. male   Blood pressure this morning 148/88- has been pretty much consistent with this.   Reports he is disappointed with the effect of celexa- not feeling much at all. He has monitored his symptoms and reports the things that were bothering him are still bothering him.  Making him somewhat drowsy  Using alprazolam rarely at night.   Reports about 3 weeks ago he was in the bathroom and the next thing he remembered he was on the floor. Has not had another episode since. No injury but did feel like he had a brief LOC. No chest pains or palpitations.  No headaches, blurred vision.    Review of Systems:  Review of Systems  Constitutional:  Negative for chills, fever and weight loss.  HENT:  Negative for tinnitus.   Respiratory:  Negative for cough, sputum production and shortness of breath.   Cardiovascular:  Negative for chest pain, palpitations and leg swelling.  Gastrointestinal:  Negative for abdominal pain, constipation, diarrhea and heartburn.  Genitourinary:  Negative for dysuria, frequency and urgency.  Musculoskeletal:  Negative for back pain, falls, joint pain and myalgias.  Skin: Negative.    Neurological:  Negative for dizziness and headaches.  Psychiatric/Behavioral:  Negative for depression and memory loss. The patient is nervous/anxious. The patient does not have insomnia.     Past Medical History:  Diagnosis Date   Anemia    Arthritis    Depression    High blood pressure    High cholesterol    Panic attacks    Prostate cancer ([place]) 10/14/2018   Past Surgical History:  Procedure Laterality Date   CATARACT EXTRACTION     CHOLECYSTECTOMY     HERNIA REPAIR     TOOTH EXTRACTION     Social History:   reports that he has been smoking cigarettes. He has never used smokeless tobacco. He reports current alcohol use. He reports that he does not use drugs.  Family History  Problem Relation Age of Onset   Heart disease Father        19   Diabetes Father    Cancer Father     Medications: Patient's Medications  New Prescriptions   No medications on file  Previous Medications   ALPRAZOLAM (XANAX) 0.5 MG TABLET    Take 0.5 tablets (0.25 mg total) by mouth at bedtime as needed for anxiety.   ATORVASTATIN (LIPITOR) 40 MG TABLET    Take 1 tablet (40 mg total) by mouth daily.   BETAMETHASONE DIPROPIONATE 0.05 % CREAM    Apply topically 2 (two) times daily.   CHOLECALCIFEROL (VITAMIN D) 50 MCG (2000 UT) CAPS    Take 1 capsule (2,000 Units total) by mouth daily.   CITALOPRAM (CELEXA) 10 MG  TABLET    Take 1 tablet (10 mg total) by mouth daily.   CYANOCOBALAMIN (,VITAMIN B-12,) 1000 MCG/ML INJECTION    Inject 1 mL every 14 days   HYDROCHLOROTHIAZIDE (HYDRODIURIL) 25 MG TABLET    Take 1 tablet (25 mg total) by mouth daily.   LOSARTAN (COZAAR) 25 MG TABLET    TAKE 1 TABLET BY MOUTH DAILY   MULTIPLE VITAMINS-MINERALS (PRESERVISION AREDS 2 PO)    Take 1 capsule by mouth daily.   SYRINGE/NEEDLE, DISP, (SYRINGE 3CC/25GX1") 25G X 1" 3 ML MISC    Inject 1 Device into the muscle every 14 (fourteen) days. Use monthly for b12 injections E53.8  Modified Medications   No medications on  file  Discontinued Medications   No medications on file    Physical Exam:  Vitals:   05/16/22 1510  Height: '5\' 6"'$  (1.676 m)   Body mass index is 24.05 kg/m. Wt Readings from Last 3 Encounters:  04/14/22 149 lb (67.6 kg)  03/10/22 147 lb (66.7 kg)  09/16/21 153 lb (69.4 kg)    Physical Exam Constitutional:      Appearance: Normal appearance.  Neurological:     Mental Status: He is alert. Mental status is at baseline.  Psychiatric:        Mood and Affect: Mood normal.     Labs reviewed: Basic Metabolic Panel: Recent Labs    09/16/21 0850 03/10/22 0851 04/14/22 0858  NA 142 139 138  K 4.1 4.1 4.1  CL 104 103 103  CO2 '28 27 28  '$ GLUCOSE 94 96 92  BUN '13 15 15  '$ CREATININE 0.97 1.09 0.88  CALCIUM 9.3 9.5 9.1  TSH 2.65  --   --    Liver Function Tests: Recent Labs    09/16/21 0850 03/10/22 0851  AST 25 20  ALT 26 18  BILITOT 0.6 0.6  PROT 6.5 7.0   No results for input(s): "LIPASE", "AMYLASE" in the last 8760 hours. No results for input(s): "AMMONIA" in the last 8760 hours. CBC: Recent Labs    09/16/21 0850 03/10/22 0851  WBC 6.5 10.0  NEUTROABS 4,804 8,220*  HGB 15.2 14.9  HCT 45.6 45.3  MCV 93.6 92.6  PLT 199 269   Lipid Panel: Recent Labs    09/16/21 0850  CHOL 135  HDL 46  LDLCALC 74  TRIG 71  CHOLHDL 2.9   TSH: Recent Labs    09/16/21 0850  TSH 2.65   A1C: Lab Results  Component Value Date   HGBA1C 5.5 03/13/2021     Assessment/Plan 1. Eczema, unspecified type - betamethasone dipropionate 0.05 % cream; Apply topically 2 (two) times daily as needed for flare.  Dispense: 45 g; Refill: 11  2. Essential hypertension -continues to be elevated.  Sill increase losartan to get blood pressure to goal. -continue HCTZ -continue low sodium diet.  - losartan (COZAAR) 50 MG tablet; Take 1 tablet (50 mg total) by mouth daily.  Dispense: 90 tablet; Refill: 1  3. Anxiety -unchanged, will stop celexa and start lexapro at this  time. - escitalopram (LEXAPRO) 10 MG tablet; Take 1 tablet (10 mg total) by mouth daily.  Dispense: 60 tablet; Refill: 0  4. Syncope, unspecified syncope type Likely vasovagal, he will report anymore episode and come to office for evaluation.   Next appt: 4 weeks for bp and anxiety.  Jackson Perry. Harle Battiest  [facility] [phone]    Virtual Visit via mychart  I connected with patient  on 05/19/22 at  8:20 AM EDT by video and verified that I am speaking with the correct person using two identifiers.  Location: Patient: home Provider: [place]   I discussed the limitations, risks, security and privacy concerns of performing an evaluation and management service by telephone and the availability of in person appointments. I also discussed with the patient that there may be a patient responsible charge related to this service. The patient expressed understanding and agreed to proceed.   I discussed the assessment and treatment plan with the patient. The patient was provided an opportunity to ask questions and all were answered. The patient agreed with the plan and demonstrated an understanding of the instructions.   The patient was advised to call back or seek an in-person evaluation if the symptoms worsen or if the condition fails to improve as anticipated.  I provided 20 minutes of non-face-to-face time during this encounter.  Jackson Perry. Harle Battiest Avs printed and mailed

## 2022-05-19 NOTE — Patient Instructions (Addendum)
To increase losartan to 50 mg daily - continue to monitor blood pressure after you take medication.   To stop celexa- tomorrow start lexapro (escitalopram ) If making you sleepy take in the evening.

## 2022-05-20 ENCOUNTER — Encounter: Payer: Medicare Other | Admitting: Nurse Practitioner

## 2022-05-27 ENCOUNTER — Encounter: Payer: Self-pay | Admitting: Nurse Practitioner

## 2022-05-27 ENCOUNTER — Ambulatory Visit (INDEPENDENT_AMBULATORY_CARE_PROVIDER_SITE_OTHER): Payer: Medicare Other | Admitting: Nurse Practitioner

## 2022-05-27 DIAGNOSIS — Z0000 Encounter for general adult medical examination without abnormal findings: Secondary | ICD-10-CM | POA: Diagnosis not present

## 2022-05-27 NOTE — Progress Notes (Signed)
This service is provided via telemedicine  No vital signs collected/recorded due to the encounter was a telemedicine visit.   Location of patient (ex: home, work): Home  Patient consents to a telephone visit:  Yes, see encounter dated 05/19/2022  Location of the provider (ex: office, home):  [place]   Name of any referring provider:  N/A  Names of all persons participating in the telemedicine service and their role in the encounter:  Sherrie Mustache, Nurse Practitioner, Carroll Kinds, CMA, and patient.    Time spent on call:  10 minutes with medical assistant

## 2022-05-27 NOTE — Patient Instructions (Signed)
[name] , Thank you for taking time to come for your Medicare Wellness Visit. I appreciate your ongoing commitment to your health goals. Please review the following plan we discussed and let me know if I can assist you in the future.   Screening recommendations/referrals: Colonoscopy aged out Recommended yearly ophthalmology/optometry visit for glaucoma screening and checkup Recommended yearly dental visit for hygiene and checkup  Vaccinations: Influenza vaccine due annually in September/October Pneumococcal vaccine up to date Tdap vaccine up to date Shingles vaccine DUE- recommend to get at your local pharmacy       Advanced directives: on file.   Conditions/risks identified: advanced age, hypertension, hyperlipidemia   Next appointment: yearly, next in person  Preventive Care 32 Years and Older, Male Preventive care refers to lifestyle choices and visits with your health care provider that can promote health and wellness. What does preventive care include? A yearly physical exam. This is also called an annual well check. Dental exams once or twice a year. Routine eye exams. Ask your health care provider how often you should have your eyes checked. Personal lifestyle choices, including: Daily care of your teeth and gums. Regular physical activity. Eating a healthy diet. Avoiding tobacco and drug use. Limiting alcohol use. Practicing safe sex. Taking low doses of aspirin every day. Taking vitamin and mineral supplements as recommended by your health care provider. What happens during an annual well check? The services and screenings done by your health care provider during your annual well check will depend on your age, overall health, lifestyle risk factors, and family history of disease. Counseling  Your health care provider may ask you questions about your: Alcohol use. Tobacco use. Drug use. Emotional well-being. Home and relationship well-being. Sexual  activity. Eating habits. History of falls. Memory and ability to understand (cognition). Work and work Statistician. Screening  You may have the following tests or measurements: Height, weight, and BMI. Blood pressure. Lipid and cholesterol levels. These may be checked every 5 years, or more frequently if you are over 4 years old. Skin check. Lung cancer screening. You may have this screening every year starting at age 75 if you have a 30-pack-year history of smoking and currently smoke or have quit within the past 15 years. Fecal occult blood test (FOBT) of the stool. You may have this test every year starting at age 32. Flexible sigmoidoscopy or colonoscopy. You may have a sigmoidoscopy every 5 years or a colonoscopy every 10 years starting at age 52. Prostate cancer screening. Recommendations will vary depending on your family history and other risks. Hepatitis C blood test. Hepatitis B blood test. Sexually transmitted disease (STD) testing. Diabetes screening. This is done by checking your blood sugar (glucose) after you have not eaten for a while (fasting). You may have this done every 1-3 years. Abdominal aortic aneurysm (AAA) screening. You may need this if you are a current or former smoker. Osteoporosis. You may be screened starting at age 17 if you are at high risk. Talk with your health care provider about your test results, treatment options, and if necessary, the need for more tests. Vaccines  Your health care provider may recommend certain vaccines, such as: Influenza vaccine. This is recommended every year. Tetanus, diphtheria, and acellular pertussis (Tdap, Td) vaccine. You may need a Td booster every 10 years. Zoster vaccine. You may need this after age 42. Pneumococcal 13-valent conjugate (PCV13) vaccine. One dose is recommended after age 14. Pneumococcal polysaccharide (PPSV23) vaccine. One dose is recommended  after age 33. Talk to your health care provider about which  screenings and vaccines you need and how often you need them. This information is not intended to replace advice given to you by your health care provider. Make sure you discuss any questions you have with your health care provider. Document Released: 09/21/2015 Document Revised: 05/14/2016 Document Reviewed: 06/26/2015 Elsevier Interactive Patient Education  2017 [employer] Prevention in the Home Falls can cause injuries. They can happen to people of all ages. There are many things you can do to make your home safe and to help prevent falls. What can I do on the outside of my home? Regularly fix the edges of walkways and driveways and fix any cracks. Remove anything that might make you trip as you walk through a door, such as a raised step or threshold. Trim any bushes or trees on the path to your home. Use bright outdoor lighting. Clear any walking paths of anything that might make someone trip, such as rocks or tools. Regularly check to see if handrails are loose or broken. Make sure that both sides of any steps have handrails. Any raised decks and porches should have guardrails on the edges. Have any leaves, snow, or ice cleared regularly. Use sand or salt on walking paths during winter. Clean up any spills in your garage right away. This includes oil or grease spills. What can I do in the bathroom? Use night lights. Install grab bars by the toilet and in the tub and shower. Do not use towel bars as grab bars. Use non-skid mats or decals in the tub or shower. If you need to sit down in the shower, use a plastic, non-slip stool. Keep the floor dry. Clean up any water that spills on the floor as soon as it happens. Remove soap buildup in the tub or shower regularly. Attach bath mats securely with double-sided non-slip rug tape. Do not have throw rugs and other things on the floor that can make you trip. What can I do in the bedroom? Use night lights. Make sure that you have a  light by your bed that is easy to reach. Do not use any sheets or blankets that are too big for your bed. They should not hang down onto the floor. Have a firm chair that has side arms. You can use this for support while you get dressed. Do not have throw rugs and other things on the floor that can make you trip. What can I do in the kitchen? Clean up any spills right away. Avoid walking on wet floors. Keep items that you use a lot in easy-to-reach places. If you need to reach something above you, use a strong step stool that has a grab bar. Keep electrical cords out of the way. Do not use floor polish or wax that makes floors slippery. If you must use wax, use non-skid floor wax. Do not have throw rugs and other things on the floor that can make you trip. What can I do with my stairs? Do not leave any items on the stairs. Make sure that there are handrails on both sides of the stairs and use them. Fix handrails that are broken or loose. Make sure that handrails are as long as the stairways. Check any carpeting to make sure that it is firmly attached to the stairs. Fix any carpet that is loose or worn. Avoid having throw rugs at the top or bottom of the stairs. If you  do have throw rugs, attach them to the floor with carpet tape. Make sure that you have a light switch at the top of the stairs and the bottom of the stairs. If you do not have them, ask someone to add them for you. What else can I do to help prevent falls? Wear shoes that: Do not have high heels. Have rubber bottoms. Are comfortable and fit you well. Are closed at the toe. Do not wear sandals. If you use a stepladder: Make sure that it is fully opened. Do not climb a closed stepladder. Make sure that both sides of the stepladder are locked into place. Ask someone to hold it for you, if possible. Clearly mark and make sure that you can see: Any grab bars or handrails. First and last steps. Where the edge of each step  is. Use tools that help you move around (mobility aids) if they are needed. These include: Canes. Walkers. Scooters. Crutches. Turn on the lights when you go into a dark area. Replace any light bulbs as soon as they burn out. Set up your furniture so you have a clear path. Avoid moving your furniture around. If any of your floors are uneven, fix them. If there are any pets around you, be aware of where they are. Review your medicines with your doctor. Some medicines can make you feel dizzy. This can increase your chance of falling. Ask your doctor what other things that you can do to help prevent falls. This information is not intended to replace advice given to you by your health care provider. Make sure you discuss any questions you have with your health care provider. Document Released: 06/21/2009 Document Revised: 01/31/2016 Document Reviewed: 09/29/2014 Elsevier Interactive Patient Education  2017 [employer].

## 2022-05-27 NOTE — Progress Notes (Signed)
Subjective:   Connery Shiffler Pennypacker is a 76 y.o. male who presents for Medicare Annual/Subsequent preventive examination.  Review of Systems           Objective:    There were no vitals filed for this visit. There is no height or weight on file to calculate BMI.     05/16/2022    3:11 PM 03/10/2022    8:01 AM 05/14/2021   11:37 AM 03/13/2021    8:33 AM 09/13/2020    1:22 PM 11/22/2019   11:09 AM 10/19/2018    8:19 AM  Advanced Directives  Does Patient Have a Medical Advance Directive? Yes Yes Yes Yes Yes Yes Yes  Type of [place] administrator Power of [place];Living will;Out of facility DNR (pink MOST or yellow form) [place];Out of facility DNR (pink MOST or yellow form);Living will [place];Living will Living will;Healthcare Power of Attorney  Does patient want to make changes to medical advance directive? No - Patient declined No - Patient declined No - Patient declined No - Patient declined No - Patient declined Yes (ED - Information included in AVS) No - Patient declined  Copy of [place] in Chart? Yes - validated most recent copy scanned in chart (See row information) Yes - validated most recent copy scanned in chart (See row information)  No - copy requested No - copy requested No - copy requested No - copy requested    Current Medications (verified) Outpatient Encounter Medications as of 05/27/2022  Medication Sig   ALPRAZolam (XANAX) 0.5 MG tablet Take 0.5 tablets (0.25 mg total) by mouth at bedtime as needed for anxiety.   atorvastatin (LIPITOR) 40 MG tablet Take 1 tablet (40 mg total) by mouth daily.   betamethasone dipropionate 0.05 % cream Apply topically 2 (two) times daily.   Cholecalciferol (VITAMIN D) 50 MCG (2000 UT) CAPS Take 1 capsule (2,000 Units total) by mouth daily.   cyanocobalamin (,VITAMIN B-12,) 1000 MCG/ML  injection Inject 1 mL every 14 days   escitalopram (LEXAPRO) 10 MG tablet Take 1 tablet (10 mg total) by mouth daily.   hydrochlorothiazide (HYDRODIURIL) 25 MG tablet Take 1 tablet (25 mg total) by mouth daily.   losartan (COZAAR) 50 MG tablet Take 1 tablet (50 mg total) by mouth daily.   Multiple Vitamins-Minerals (PRESERVISION AREDS 2 PO) Take 1 capsule by mouth daily.   Syringe/Needle, Disp, (SYRINGE 3CC/25GX1") 25G X 1" 3 ML MISC Inject 1 Device into the muscle every 14 (fourteen) days. Use monthly for b12 injections E53.8   No facility-administered encounter medications on file as of 05/27/2022.    Allergies (verified) Bupropion hcl and Shrimp [shellfish allergy]   History: Past Medical History:  Diagnosis Date   Anemia    Arthritis    Depression    High blood pressure    High cholesterol    Panic attacks    Prostate cancer ([place]) 10/14/2018   Past Surgical History:  Procedure Laterality Date   CATARACT EXTRACTION     CHOLECYSTECTOMY     HERNIA REPAIR     TOOTH EXTRACTION     Family History  Problem Relation Age of Onset   Heart disease Father        25   Diabetes Father    Cancer Father    Social History   Socioeconomic History   Marital status: Married    Spouse name:  Not on file   Number of children: Not on file   Years of education: Not on file   Highest education level: Not on file  Occupational History   Not on file  Tobacco Use   Smoking status: Every Day    Types: Cigarettes   Smokeless tobacco: Never   Tobacco comments:    2 per day, sometimes patient will go 1 week or longer without any   Vaping Use   Vaping Use: Never used  Substance and Sexual Activity   Alcohol use: Yes    Comment: 2 drinks daily- [place]   Drug use: Never   Sexual activity: Not Currently  Other Topics Concern   Not on file  Social History Narrative   Diet: Blank      Do you drink/ eat things with caffeine?  Coffee      Marital status:    M                            What year were you married ? 1969      Do you live in a house, apartment,assistred living, condo, trailer, etc.)? Condo      Is it one or more stories? 1      How many persons live in your home ? 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   Yes                           Type & how often 1 and 1/2- 2 mile brisk walk      ADVANCED DIRECTIVES (Please bring copies)      Do you have a living will? Yes      Do you have a DNR form?  Yes                     If not, do you want to discuss one?       Do you have signed POA?HPOA forms?   Yes              If so, please bring to your appointment      FUNCTIONAL STATUS- To be completed by Spouse / child / Staff       Do you have difficulty bathing or dressing yourself ?  No      Do you have difficulty preparing food or eating ? No      Do you have difficulty managing your mediation ? No      Do you have difficulty managing your finances ? No      Do you have difficulty affording your medication ? No      Social Determinants of Radio broadcast assistant Strain: Not on file  Food Insecurity: Not on file  Transportation Needs: Not on file  Physical Activity: Not on file  Stress: Not on file  Social Connections: Not on file    Tobacco Counseling Ready to quit: Not Answered Counseling given: Not Answered Tobacco comments: 2 per day, sometimes patient will go 1 week or longer without any    Clinical Intake:  Diabetic?no         Activities of Daily Living     No data to display          Patient Care Team: [name], NP as PCP - General (Geriatric Medicine) Sable Feil, MD as Consulting Physician (Gastroenterology) Michael Boston, MD as Consulting Physician (General Surgery) Ceasar Mons, MD as Consulting  Physician (Urology)  Indicate any recent [place] you may have received from other than [facility] providers in the past year (date may be approximate).     Assessment:   This is a routine wellness examination for Bryse.  Hearing/Vision screen Hearing Screening - Comments:: Patient has no hearing problems Vision Screening - Comments:: Patient has vision problems. Patient had yearly eye exam. Patient goes to [facility].   Dietary issues and exercise activities discussed:     Goals Addressed   None    Depression Screen    05/27/2022    8:16 AM 03/10/2022    8:25 AM 05/14/2021   11:35 AM 09/13/2020    1:20 PM 11/22/2019   11:12 AM 08/15/2019    8:10 AM 02/16/2019    4:13 PM  PHQ 2/9 Scores  PHQ - 2 Score 0 0 0 2 0 0 0  PHQ- 9 Score    4       Fall Risk    05/27/2022    8:17 AM 05/19/2022    8:07 AM 04/14/2022    8:07 AM 03/10/2022    8:24 AM 05/14/2021   11:36 AM  Fall Risk   Falls in the past year? 1 1 0 0 0  Number falls in past yr: 1 0 0 0 0  Injury with Fall? 0 0 0 0 0  Risk for fall due to : History of fall(s) No Fall Risks No Fall Risks No Fall Risks No Fall Risks  Follow up Falls evaluation completed Falls evaluation completed Falls evaluation completed Falls evaluation completed Falls evaluation completed    [place]:  Any stairs in or around the home? No  If so, are there any without handrails?  na Home free of loose throw rugs in walkways, pet beds, electrical cords, etc? Yes  Adequate lighting in your home to reduce risk of falls? Yes   ASSISTIVE DEVICES UTILIZED TO PREVENT FALLS:  Life alert? No  Use of a cane, walker or w/c? No  Grab bars in the bathroom? Yes  Shower chair or bench in shower? Yes  Elevated toilet seat or a handicapped toilet? No   TIMED UP AND GO:  Was the test performed? No .    Cognitive Function:        05/27/2022    8:13 AM 05/14/2021   11:37 AM 11/22/2019   11:10 AM 10/19/2018     8:15 AM  6CIT Screen  What Year? 0 points 0 points 0 points 0 points  What month? 0 points 0 points 0 points 0 points  What time? 0 points 0 points  0 points  Count back from 20 0 points 0 points 0 points 0 points  Months in reverse 0 points 0 points 0 points 0 points  Repeat phrase 0 points 4 points 0 points 0 points  Total Score 0 points 4 points  0 points    Immunizations Immunization History  Administered Date(s) Administered   Fluad Quad(high Dose 65+) 04/29/2019   Influenza, High Dose Seasonal PF 07/26/2018, 06/06/2020, 05/28/2021   Influenza,inj,Quad PF,6+  Mos 07/10/2015, 05/22/2016, 07/25/2017   PFIZER(Purple Top)SARS-COV-2 Vaccination 09/27/2019, 10/18/2019, 05/12/2020, 10/25/2020   Pfizer Covid-19 Vaccine Bivalent Booster 33yr & up 07/03/2021, 02/23/2022   Pneumococcal Conjugate-13 07/03/2016   Pneumococcal Polysaccharide-23 02/04/2013   Tdap 03/28/2020   Tetanus 02/04/2013    TDAP status: Up to date  Flu Vaccine status: Due, Education has been provided regarding the importance of this vaccine. Advised may receive this vaccine at local pharmacy or Health Dept. Aware to provide a copy of the vaccination record if obtained from local pharmacy or Health Dept. Verbalized acceptance and understanding.  Pneumococcal vaccine status: Up to date  Covid-19 vaccine status: Information provided on how to obtain vaccines.   Qualifies for Shingles Vaccine? Yes   Zostavax completed No   Shingrix Completed?: No.    Education has been provided regarding the importance of this vaccine. Patient has been advised to call insurance company to determine out of pocket expense if they have not yet received this vaccine. Advised may also receive vaccine at local pharmacy or Health Dept. Verbalized acceptance and understanding.  Screening Tests Health Maintenance  Topic Date Due   Zoster Vaccines- Shingrix (1 of 2) Never done   INFLUENZA VACCINE  04/08/2022   COVID-19 Vaccine (7 - Pfizer  risk series) 04/20/2022   TETANUS/TDAP  03/28/2030   Pneumonia Vaccine 76 Years old  Completed   Hepatitis C Screening  Completed   HPV VACCINES  Aged Out   COLONOSCOPY (Pts 45-429yrInsurance coverage will need to be confirmed)  Discontinued    Health Maintenance  Health Maintenance Due  Topic Date Due   Zoster Vaccines- Shingrix (1 of 2) Never done   INFLUENZA VACCINE  04/08/2022   COVID-19 Vaccine (7 - Pfizer risk series) 04/20/2022    Colorectal cancer screening: No longer required.   Lung Cancer Screening: (Low Dose CT Chest recommended if Age 77-80ears, 30 pack-year currently smoking OR have quit w/in 15years.) does qualify.   Lung Cancer Screening Referral: pt declines  Additional Screening:  Hepatitis C Screening: does qualify; Completed 2017   Vision Screening: Recommended annual ophthalmology exams for early detection of glaucoma and other disorders of the eye. Is the patient up to date with their annual eye exam?  Yes  Who is the provider or what is the name of the office in which the patient [facility]ends [facility] If pt is not established with a provider, would they like to be referred to a provider to establish care? No .   Dental Screening: Recommended annual dental exams for proper oral hygiene  Community Resource Referral / Chronic Care Management: CRR required this visit?  No   CCM required this visit?  No      Plan:     I have personally reviewed and noted the following in the patient's chart:   Medical and social history Use of alcohol, tobacco or illicit drugs  Current medications and supplements including opioid prescriptions. Patient is not currently taking opioid prescriptions. Functional ability and status Nutritional status Physical activity Advanced directives List of other physicians Hospitalizations, surgeries, and ER visits in previous 12 months Vitals Screenings to include cognitive, depression, and  falls Referrals and appointments  In addition, I have reviewed and discussed with patient certain preventive protocols, quality metrics, and best practice recommendations. A written personalized care plan for preventive services as well as general preventive health recommendations were provided to patient.     JeLauree ChandlerNP   05/27/2022  Virtual Visit via Telephone Note  I connected with patient 05/27/22 at  8:20 AM EDT by telephone and verified that I am speaking with the correct person using two identifiers.  Location: Patient: home Provider: [place]   I discussed the limitations, risks, security and privacy concerns of performing an evaluation and management service by telephone and the availability of in person appointments. I also discussed with the patient that there may be a patient responsible charge related to this service. The patient expressed understanding and agreed to proceed.   I discussed the assessment and treatment plan with the patient. The patient was provided an opportunity to ask questions and all were answered. The patient agreed with the plan and demonstrated an understanding of the instructions.   The patient was advised to call back or seek an in-person evaluation if the symptoms worsen or if the condition fails to improve as anticipated.  I provided 15 minutes of non-face-to-face time during this encounter.  Carlos American. Harle Battiest Avs printed and mailed

## 2022-06-08 DEATH — deceased

## 2022-06-19 ENCOUNTER — Telehealth (INDEPENDENT_AMBULATORY_CARE_PROVIDER_SITE_OTHER): Payer: Medicare Other | Admitting: Nurse Practitioner

## 2022-06-19 DIAGNOSIS — I10 Essential (primary) hypertension: Secondary | ICD-10-CM | POA: Diagnosis not present

## 2022-06-19 DIAGNOSIS — F419 Anxiety disorder, unspecified: Secondary | ICD-10-CM

## 2022-06-19 NOTE — Progress Notes (Signed)
This service is provided via telemedicine  No vital signs collected/recorded due to the encounter was a telemedicine visit.   Location of patient (ex: home, work):  Home  Patient consents to a telephone visit:  Yes, see encounter dated 05/19/2022  Location of the provider (ex: office, home):  [place]   Name of any referring provider:  N/A  Names of all persons participating in the telemedicine service and their role in the encounter:  Sherrie Mustache, Nurse Practitioner, Carroll Kinds, CMA, and patient.    Time spent on call:  7 minutes with medical assistant

## 2022-06-19 NOTE — Progress Notes (Signed)
Careteam: Patient Care Team: [name], NP as PCP - General (Geriatric Medicine) Sable Feil, MD as Consulting Physician (Gastroenterology) Michael Boston, MD as Consulting Physician (General Surgery) Ceasar Mons, MD as Consulting Physician (Urology)  Advanced Directive information    Allergies  Allergen Reactions   Bupropion Hcl    Shrimp [facility] Allergy]     Chief Complaint  Patient presents with   Acute Visit    Follow up on anxiety. Patient states that he is doing better.Patient states that medication is working for him. Patient has no concerns     HPI: Patient is a 76 y.o. male for anxiety follow up Reports it took medication a while to work but feels like he has benefit.  Anxiety is mild at this time but able to manage better.  He is sleeping well. Using alprazolam half tablet rarely  He is taking blood pressure medication at night because it makes him sleepy.  Blood pressure 134/82, in the afternoon sbp in the 120s but first thing in the morning elevated.   He has not had any syncopal episodes.    Review of Systems:  Review of Systems  Constitutional:  Negative for chills, fever and weight loss.  HENT:  Negative for tinnitus.   Respiratory:  Negative for cough, sputum production and shortness of breath.   Cardiovascular:  Negative for chest pain, palpitations and leg swelling.  Gastrointestinal:  Negative for abdominal pain, constipation, diarrhea and heartburn.  Genitourinary:  Negative for dysuria, frequency and urgency.  Musculoskeletal:  Negative for back pain, falls, joint pain and myalgias.  Skin: Negative.   Neurological:  Negative for dizziness and headaches.  Psychiatric/Behavioral:  Negative for depression and memory loss. The patient does not have insomnia.     Past Medical History:  Diagnosis Date   Anemia    Arthritis    Depression    High blood pressure    High cholesterol    Panic attacks    Prostate  cancer ([place]) 10/14/2018   Past Surgical History:  Procedure Laterality Date   CATARACT EXTRACTION     CHOLECYSTECTOMY     HERNIA REPAIR     TOOTH EXTRACTION     Social History:   reports that he has been smoking cigarettes. He has never used smokeless tobacco. He reports current alcohol use. He reports that he does not use drugs.  Family History  Problem Relation Age of Onset   Heart disease Father        49   Diabetes Father    Cancer Father     Medications: Patient's Medications  New Prescriptions   No medications on file  Previous Medications   ALPRAZOLAM (XANAX) 0.5 MG TABLET    Take 0.5 tablets (0.25 mg total) by mouth at bedtime as needed for anxiety.   ATORVASTATIN (LIPITOR) 40 MG TABLET    Take 1 tablet (40 mg total) by mouth daily.   BETAMETHASONE DIPROPIONATE 0.05 % CREAM    Apply topically 2 (two) times daily.   CHOLECALCIFEROL (VITAMIN D) 50 MCG (2000 UT) CAPS    Take 1 capsule (2,000 Units total) by mouth daily.   CYANOCOBALAMIN (,VITAMIN B-12,) 1000 MCG/ML INJECTION    Inject 1 mL every 14 days   ESCITALOPRAM (LEXAPRO) 10 MG TABLET    Take 1 tablet (10 mg total) by mouth daily.   HYDROCHLOROTHIAZIDE (HYDRODIURIL) 25 MG TABLET    Take 1 tablet (25 mg total) by mouth daily.   LOSARTAN (  COZAAR) 50 MG TABLET    Take 1 tablet (50 mg total) by mouth daily.   MULTIPLE VITAMINS-MINERALS (PRESERVISION AREDS 2 PO)    Take 1 capsule by mouth daily.   SYRINGE/NEEDLE, DISP, (SYRINGE 3CC/25GX1") 25G X 1" 3 ML MISC    Inject 1 Device into the muscle every 14 (fourteen) days. Use monthly for b12 injections E53.8  Modified Medications   No medications on file  Discontinued Medications   No medications on file    Physical Exam:  There were no vitals filed for this visit. There is no height or weight on file to calculate BMI. Wt Readings from Last 3 Encounters:  04/14/22 149 lb (67.6 kg)  03/10/22 147 lb (66.7 kg)  09/16/21 153 lb (69.4 kg)    Physical  Exam Constitutional:      Appearance: Normal appearance.  Pulmonary:     Effort: Pulmonary effort is normal.  Neurological:     Mental Status: He is alert. Mental status is at baseline.  Psychiatric:        Mood and Affect: Mood normal.     Labs reviewed: Basic Metabolic Panel: Recent Labs    09/16/21 0850 03/10/22 0851 04/14/22 0858  NA 142 139 138  K 4.1 4.1 4.1  CL 104 103 103  CO2 '28 27 28  '$ GLUCOSE 94 96 92  BUN '13 15 15  '$ CREATININE 0.97 1.09 0.88  CALCIUM 9.3 9.5 9.1  TSH 2.65  --   --    Liver Function Tests: Recent Labs    09/16/21 0850 03/10/22 0851  AST 25 20  ALT 26 18  BILITOT 0.6 0.6  PROT 6.5 7.0   No results for input(s): "LIPASE", "AMYLASE" in the last 8760 hours. No results for input(s): "AMMONIA" in the last 8760 hours. CBC: Recent Labs    09/16/21 0850 03/10/22 0851  WBC 6.5 10.0  NEUTROABS 4,804 8,220*  HGB 15.2 14.9  HCT 45.6 45.3  MCV 93.6 92.6  PLT 199 269   Lipid Panel: Recent Labs    09/16/21 0850  CHOL 135  HDL 46  LDLCALC 74  TRIG 71  CHOLHDL 2.9   TSH: Recent Labs    09/16/21 0850  TSH 2.65   A1C: Lab Results  Component Value Date   HGBA1C 5.5 03/13/2021     Assessment/Plan 1. Anxiety -controlled on lexapro, to continue with lifestyle modifications   2. Essential hypertension -much better control on current regimen. Continue medication with dietary modifications.    Next appt: 09/22/2022 routine follow up  [place]. Harle Battiest  [facility] [phone]    Virtual Visit via mychart video  I connected with patient on 06/19/22 at  8:20 AM EDT by video and verified that I am speaking with the correct person using two identifiers.  Location: Patient: home Provider: [place]   I discussed the limitations, risks, security and privacy concerns of performing an evaluation and management service by telephone and the availability of in person appointments. I also discussed  with the patient that there may be a patient responsible charge related to this service. The patient expressed understanding and agreed to proceed.   I discussed the assessment and treatment plan with the patient. The patient was provided an opportunity to ask questions and all were answered. The patient agreed with the plan and demonstrated an understanding of the instructions.   The patient was advised to call back or seek an in-person evaluation if the symptoms  worsen or if the condition fails to improve as anticipated.  I provided 15 minutes of non-face-to-face time during this encounter.  Carlos American. Harle Battiest Avs printed and mailed

## 2022-07-14 ENCOUNTER — Other Ambulatory Visit: Payer: Self-pay | Admitting: Nurse Practitioner

## 2022-07-14 DIAGNOSIS — F419 Anxiety disorder, unspecified: Secondary | ICD-10-CM

## 2022-08-10 ENCOUNTER — Encounter: Payer: Self-pay | Admitting: Nurse Practitioner

## 2022-08-11 ENCOUNTER — Telehealth (INDEPENDENT_AMBULATORY_CARE_PROVIDER_SITE_OTHER): Payer: Medicare Other | Admitting: Nurse Practitioner

## 2022-08-11 ENCOUNTER — Encounter: Payer: Self-pay | Admitting: Nurse Practitioner

## 2022-08-11 VITALS — Temp 96.7°F | Ht 66.0 in | Wt 149.0 lb

## 2022-08-11 DIAGNOSIS — U071 COVID-19: Secondary | ICD-10-CM | POA: Diagnosis not present

## 2022-08-11 NOTE — Progress Notes (Signed)
Careteam: Patient Care Team: [name], NP as PCP - General (Geriatric Medicine) Sable Feil, MD as Consulting Physician (Gastroenterology) Michael Boston, MD as Consulting Physician (General Surgery) Ceasar Mons, MD as Consulting Physician (Urology)  Advanced Directive information    Allergies  Allergen Reactions   Bupropion Hcl    Shrimp [Shellfish Allergy]     Chief Complaint  Patient presents with   Covid Positive    Positive covid  tested + on Saturday , he is having , sore throat , headache, some coughing, fever , chills, body aches. Taking otc Tylenol     HPI: Patient is a 76 y.o. male for video visit. Pt tested positive for COVID. 5 days ago he started to have a sore throat then that night had bed chills and then was very fatigued, decrease appetite.  He took vaccine in November.  Today seems like things are better.  Today he is still fatigue.  No additional fever, temp now is 96.9 A scratchy throat and nose is congestion.   Review of Systems:  Review of Systems  Constitutional:  Positive for malaise/fatigue. Negative for chills, fever and weight loss.  HENT:  Positive for congestion and sore throat. Negative for tinnitus.   Respiratory:  Negative for cough, sputum production and shortness of breath.   Cardiovascular:  Negative for chest pain, palpitations and leg swelling.  Gastrointestinal:  Negative for abdominal pain, constipation, diarrhea and heartburn.  Genitourinary:  Negative for dysuria, frequency and urgency.  Musculoskeletal:  Negative for back pain, falls, joint pain and myalgias.  Skin: Negative.   Neurological:  Negative for dizziness and headaches.  Psychiatric/Behavioral:  Negative for depression and memory loss. The patient does not have insomnia.     Past Medical History:  Diagnosis Date   Anemia    Arthritis    Depression    High blood pressure    High cholesterol    Panic attacks    Prostate cancer  ([place]) 10/14/2018   Past Surgical History:  Procedure Laterality Date   CATARACT EXTRACTION     CHOLECYSTECTOMY     HERNIA REPAIR     TOOTH EXTRACTION     Social History:   reports that he has been smoking cigarettes. He has never used smokeless tobacco. He reports current alcohol use. He reports that he does not use drugs.  Family History  Problem Relation Age of Onset   Heart disease Father        80   Diabetes Father    Cancer Father     Medications: Patient's Medications  New Prescriptions   No medications on file  Previous Medications   ALPRAZOLAM (XANAX) 0.5 MG TABLET    Take 0.5 tablets (0.25 mg total) by mouth at bedtime as needed for anxiety.   ATORVASTATIN (LIPITOR) 40 MG TABLET    Take 1 tablet (40 mg total) by mouth daily.   BETAMETHASONE DIPROPIONATE 0.05 % CREAM    Apply topically 2 (two) times daily.   CHOLECALCIFEROL (VITAMIN D) 50 MCG (2000 UT) CAPS    Take 1 capsule (2,000 Units total) by mouth daily.   CYANOCOBALAMIN (,VITAMIN B-12,) 1000 MCG/ML INJECTION    Inject 1 mL every 14 days   ESCITALOPRAM (LEXAPRO) 10 MG TABLET    TAKE 1 TABLET BY MOUTH DAILY   HYDROCHLOROTHIAZIDE (HYDRODIURIL) 25 MG TABLET    Take 1 tablet (25 mg total) by mouth daily.   LOSARTAN (COZAAR) 50 MG TABLET  Take 1 tablet (50 mg total) by mouth daily.   MULTIPLE VITAMINS-MINERALS (PRESERVISION AREDS 2 PO)    Take 1 capsule by mouth daily.   SYRINGE/NEEDLE, DISP, (SYRINGE 3CC/25GX1") 25G X 1" 3 ML MISC    Inject 1 Device into the muscle every 14 (fourteen) days. Use monthly for b12 injections E53.8  Modified Medications   No medications on file  Discontinued Medications   No medications on file    Physical Exam:  Vitals:   08/11/22 0918  Temp: (!) 96.7 F (35.9 C)  Weight: 149 lb (67.6 kg)  Height: '5\' 6"'$  (1.676 m)   Body mass index is 24.05 kg/m. Wt Readings from Last 3 Encounters:  08/11/22 149 lb (67.6 kg)  04/14/22 149 lb (67.6 kg)  03/10/22 147 lb (66.7 kg)     Physical Exam Constitutional:      Appearance: Normal appearance.  Neurological:     Mental Status: He is alert. Mental status is at baseline.  Psychiatric:        Mood and Affect: Mood normal.     Labs reviewed: Basic Metabolic Panel: Recent Labs    09/16/21 0850 03/10/22 0851 04/14/22 0858  NA 142 139 138  K 4.1 4.1 4.1  CL 104 103 103  CO2 '28 27 28  '$ GLUCOSE 94 96 92  BUN '13 15 15  '$ CREATININE 0.97 1.09 0.88  CALCIUM 9.3 9.5 9.1  TSH 2.65  --   --    Liver Function Tests: Recent Labs    09/16/21 0850 03/10/22 0851  AST 25 20  ALT 26 18  BILITOT 0.6 0.6  PROT 6.5 7.0   No results for input(s): "LIPASE", "AMYLASE" in the last 8760 hours. No results for input(s): "AMMONIA" in the last 8760 hours. CBC: Recent Labs    09/16/21 0850 03/10/22 0851  WBC 6.5 10.0  NEUTROABS 4,804 8,220*  HGB 15.2 14.9  HCT 45.6 45.3  MCV 93.6 92.6  PLT 199 269   Lipid Panel: Recent Labs    09/16/21 0850  CHOL 135  HDL 46  LDLCALC 74  TRIG 71  CHOLHDL 2.9   TSH: Recent Labs    09/16/21 0850  TSH 2.65   A1C: Lab Results  Component Value Date   HGBA1C 5.5 03/13/2021     Assessment/Plan 1. COVID-19 -doing better at this time.  -increase hydration -saline to nose.  -continue tylenol PRN -  Please continue isolation at home for at least 5 days since the start of your symptoms. On day 5, as long as your symptoms are improving and you are not having a fever, you can return to normal activities, ensuring you are wearing a mask at all times when not at home for 5 additional days. Once you complete your 10 days you are good to resume normal acitivities.    Jackson Perry. Harle Battiest  [facility] [phone]    Virtual Visit via video  I connected with patient on 08/11/22 at  9:40 AM EST by mychart and verified that I am speaking with the correct person using two identifiers.  Location: Patient: home Provider: office    I  discussed the limitations, risks, security and privacy concerns of performing an evaluation and management service by telephone and the availability of in person appointments. I also discussed with the patient that there may be a patient responsible charge related to this service. The patient expressed understanding and agreed to proceed.   I discussed the assessment  and treatment plan with the patient. The patient was provided an opportunity to ask questions and all were answered. The patient agreed with the plan and demonstrated an understanding of the instructions.   The patient was advised to call back or seek an in-person evaluation if the symptoms worsen or if the condition fails to improve as anticipated.  I provided 15 minutes of non-face-to-face time during this encounter.  Jackson Perry. Harle Battiest Avs printed and mailed

## 2022-09-01 IMAGING — MR MR PROSTATE WO/W CM
12 series · 48 of 48 positions shown · IV contrast (14ml multihance)
Comparison: None.

CLINICAL DATA: Prostate carcinoma, Gleason score 3+4=7. Active
surveillance.

EXAM:
MR PROSTATE WITHOUT AND WITH CONTRAST
TECHNIQUE: Multiplanar multisequence MRI images were obtained of the pelvis
centered about the prostate. Pre and post contrast images were
obtained.
CONTRAST:  14mL MULTIHANCE GADOBENATE DIMEGLUMINE 529 MG/ML IV SOLN

[Series 3: T2 · coronal · 3.0mm · 0.56mm/px · 1 of 23 slices shown (1 of 3)]
[im 1/23]
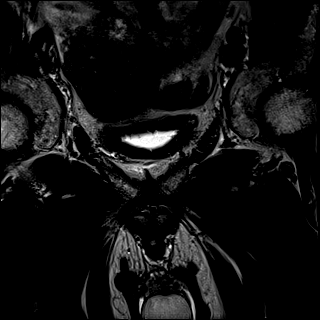

[Series 4: T1 · axial · 5.0mm · 1.25mm/px · 1 of 80 slices shown]
[im 1/80]
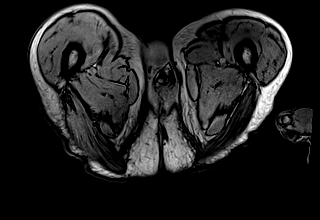

[Series 5: DWI · axial · 3.0mm · 1.75mm/px · z∈[-54,+12]mm · 2 of 69 slices shown (1 of 3)]
[im 1/69]
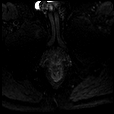
[im 69/69]
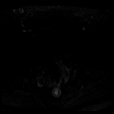

[Series 6: DWI · axial · 3.0mm · 1.75mm/px · 1 of 23 slices shown (2 of 3)]
[im 1/23]
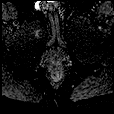

[Series 7: DWI · axial · 3.0mm · 1.75mm/px · 1 of 23 slices shown (3 of 3)]
[im 1/23]
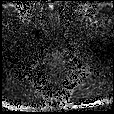

[Series 8: T2 · axial · 3.0mm · 0.56mm/px · 1 of 23 slices shown (2 of 3)]
[im 1/23]
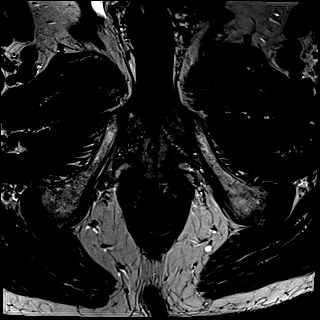

[Series 9: T2 · axial · 1.0mm · 1.04mm/px · z∈[-56,+15]mm · 2 of 72 slices shown (3 of 3)]
[im 1/72]
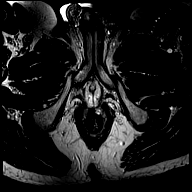
[im 72/72]
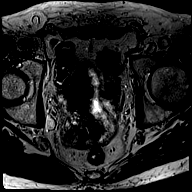

[Series 10: pre t1_twist_tra_dyn · axial · non-contrast · 3.5mm · 0.83mm/px · 1 of 20 slices shown]
[im 1/20]
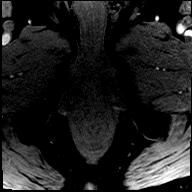

[Series 11: post t1_twist_tra_dyn-copy center · axial · non-contrast · 3.5mm · 0.83mm/px · z∈[-54,+12]mm · 17 of 600 slices shown]
[im 1/600]
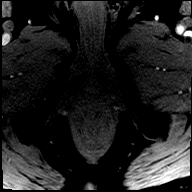
[im 38/600]
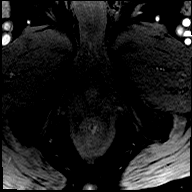
[im 75/600]
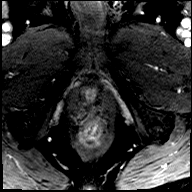
[im 113/600]
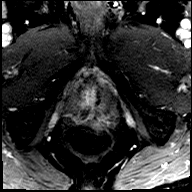
[im 150/600]
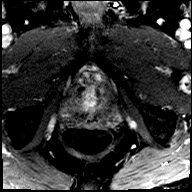
[im 188/600]
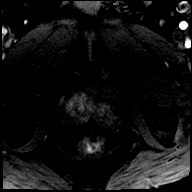
[im 225/600]
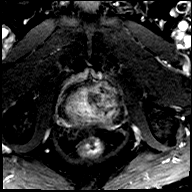
[im 263/600]
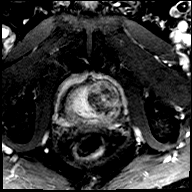
[im 300/600]
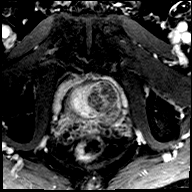
[im 337/600]
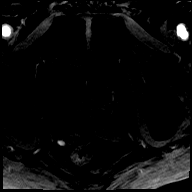
[im 375/600]
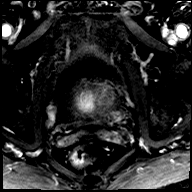
[im 412/600]
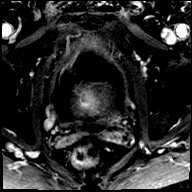
[im 450/600]
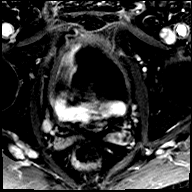
[im 487/600]
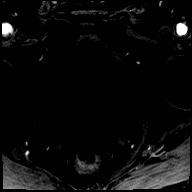
[im 525/600]
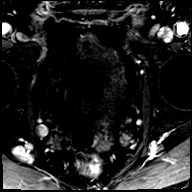
[im 562/600]
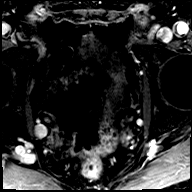
[im 600/600]
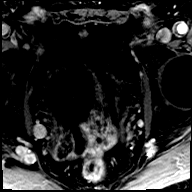

[Series 12: post t1_twist_tra_dyn-copy cent_sub · axial · 3.5mm · 0.83mm/px · z∈[-54,+12]mm · 17 of 575 slices shown]
[im 1/575]
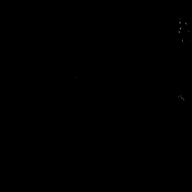
[im 36/575]
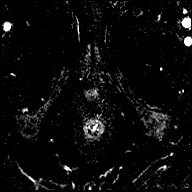
[im 72/575]
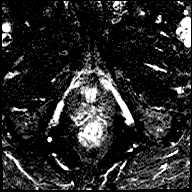
[im 108/575]
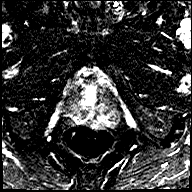
[im 144/575]
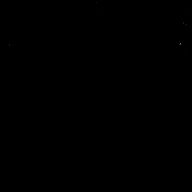
[im 180/575]
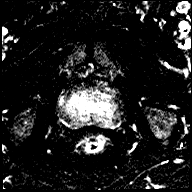
[im 216/575]
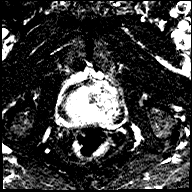
[im 252/575]
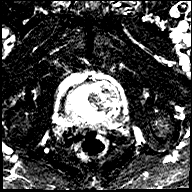
[im 288/575]
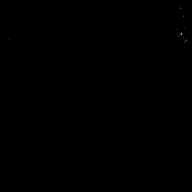
[im 323/575]
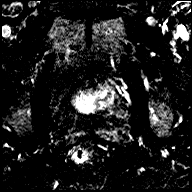
[im 359/575]
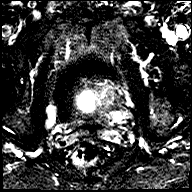
[im 395/575]
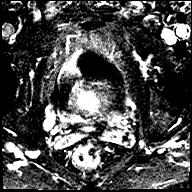
[im 431/575]
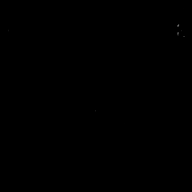
[im 467/575]
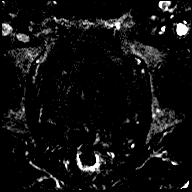
[im 503/575]
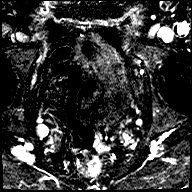
[im 539/575]
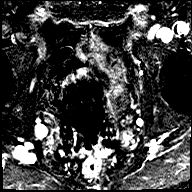
[im 575/575]
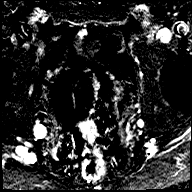

[Series 13: t1_vibe_dixon_tra_f · axial · 2.5mm · 0.91mm/px · z∈[-89,+108]mm · 2 of 80 slices shown]
[im 1/80]
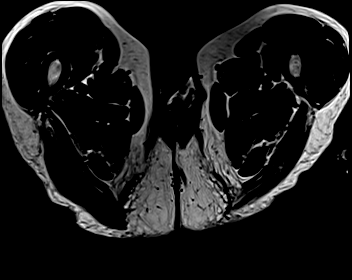
[im 80/80]
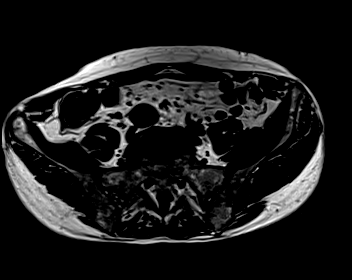

[Series 14: t1_vibe_dixon_tra_w · axial · 2.5mm · 0.91mm/px · z∈[-89,+108]mm · 2 of 80 slices shown]
[im 1/80]
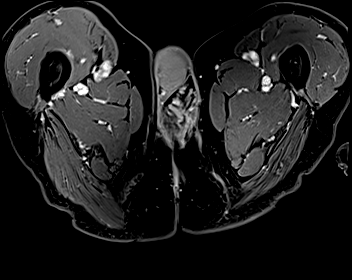
[im 80/80]
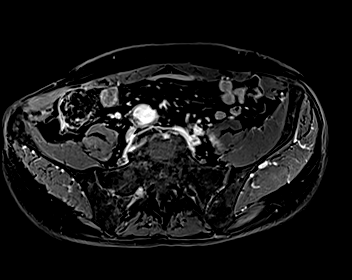

[48 of 48 positions shown; findings below may reference images not displayed]

FINDINGS: Prostate:

-- Peripheral Zone: Linear/wedge shaped hypointensities are noted on
ADC; however, no focal ADC hypointense or high b-value DWI
hyperintense nodules are identified.

-- Transition/Central Zone: Mildly enlarged with dominant
encapsulated BPH nodule on the left; However, no suspicious nodules
with obscured margins or significantly restricted diffusion seen.

-- Measurements/Volume:  4.3 by 3.7 x 5.7 cm (volume = 47 cm^3)

Transcapsular spread:  Absent

Seminal vesicle involvement:  Absent

Neurovascular bundle involvement:  Absent

Pelvic adenopathy: None visualized

Bone metastasis: None visualized

Other: Mild diffuse bladder wall thickening, consistent with chronic
bladder outlet obstruction. Sigmoid diverticulosis, without evidence
of diverticulitis.
IMPRESSION: No radiographic evidence of high-grade prostate carcinoma. PI-RADS 2
(v.2.1): Low (clinically significant cancer unlikely)

## 2022-09-11 DIAGNOSIS — K080 Exfoliation of teeth due to systemic causes: Secondary | ICD-10-CM | POA: Diagnosis not present

## 2022-09-12 ENCOUNTER — Other Ambulatory Visit: Payer: Self-pay | Admitting: Nurse Practitioner

## 2022-09-12 DIAGNOSIS — F419 Anxiety disorder, unspecified: Secondary | ICD-10-CM

## 2022-09-18 ENCOUNTER — Encounter: Payer: Self-pay | Admitting: Nurse Practitioner

## 2022-09-22 ENCOUNTER — Encounter: Payer: Self-pay | Admitting: Nurse Practitioner

## 2022-09-22 ENCOUNTER — Ambulatory Visit (INDEPENDENT_AMBULATORY_CARE_PROVIDER_SITE_OTHER): Payer: BLUE CROSS/BLUE SHIELD | Admitting: Nurse Practitioner

## 2022-09-22 VITALS — BP 136/84 | HR 61 | Temp 98.1°F | Resp 17 | Ht 68.0 in | Wt 151.6 lb

## 2022-09-22 DIAGNOSIS — D508 Other iron deficiency anemias: Secondary | ICD-10-CM | POA: Diagnosis not present

## 2022-09-22 DIAGNOSIS — E538 Deficiency of other specified B group vitamins: Secondary | ICD-10-CM

## 2022-09-22 DIAGNOSIS — E559 Vitamin D deficiency, unspecified: Secondary | ICD-10-CM | POA: Diagnosis not present

## 2022-09-22 DIAGNOSIS — C61 Malignant neoplasm of prostate: Secondary | ICD-10-CM

## 2022-09-22 DIAGNOSIS — F5104 Psychophysiologic insomnia: Secondary | ICD-10-CM

## 2022-09-22 DIAGNOSIS — I10 Essential (primary) hypertension: Secondary | ICD-10-CM | POA: Diagnosis not present

## 2022-09-22 DIAGNOSIS — F419 Anxiety disorder, unspecified: Secondary | ICD-10-CM | POA: Diagnosis not present

## 2022-09-22 DIAGNOSIS — E782 Mixed hyperlipidemia: Secondary | ICD-10-CM | POA: Diagnosis not present

## 2022-09-22 MED ORDER — ALPRAZOLAM 0.5 MG PO TABS: 0.2500 mg | ORAL_TABLET | Freq: Every evening | ORAL | 2 refills | Status: DC | PRN

## 2022-09-22 NOTE — Progress Notes (Signed)
Careteam: Patient Care Team: [name], NP as PCP - General (Geriatric Medicine) Sable Feil, MD as Consulting Physician (Gastroenterology) Michael Boston, MD as Consulting Physician (General Surgery) Ceasar Mons, MD as Consulting Physician (Urology)  PLACE OF SERVICE:  [facility] Directive information Does Patient Have a Medical Advance Directive?: Yes, Type of Advance Directive: [place];Living will;Out of facility DNR (pink MOST or yellow form), Does patient want to make changes to medical advance directive?: No - Patient declined  Allergies  Allergen Reactions   Bupropion Hcl    Shrimp [Shellfish Allergy]     Chief Complaint  Patient presents with   Medical Management of Chronic Issues    6 month follow-up. Discuss need for shingrix and additional covid boosters or post pone if patient refuses or is not a candidate.      HPI: Patient is a 77 y.o. male for routine follow up.   He went to [place] and missed injection for b12 and didn't take the next month and feels the same   Prostate cancer- was diagnosised in 2000 and wanted him to do radiation seeds but he did not want to do anything to lower immunity.  Now he has decided to watch and wait- last urologist reported there as not much change. He had MRI and has not followed up since.  Will follow up PSA today   Continues to use xanax for sleep - taking half a tablet.  Mood has been controlled on lexapro 10 mg daily, no increase in anxiety or depression   Htn- controlled on losartan and hctz   Reports he is doing dry January and has not had his evening cocktail.   Review of Systems:  Review of Systems  Constitutional:  Negative for chills, fever and weight loss.  HENT:  Negative for tinnitus.   Respiratory:  Negative for cough, sputum production and shortness of breath.   Cardiovascular:  Negative for chest pain, palpitations and leg swelling.   Gastrointestinal:  Negative for abdominal pain, constipation, diarrhea and heartburn.  Genitourinary:  Negative for dysuria, frequency and urgency.  Musculoskeletal:  Negative for back pain, falls, joint pain and myalgias.  Skin: Negative.   Neurological:  Negative for dizziness and headaches.  Psychiatric/Behavioral:  Negative for depression and memory loss. The patient does not have insomnia.     Past Medical History:  Diagnosis Date   Anemia    Arthritis    Depression    High blood pressure    High cholesterol    Panic attacks    Prostate cancer ([place]) 10/14/2018   Past Surgical History:  Procedure Laterality Date   CATARACT EXTRACTION     CHOLECYSTECTOMY     HERNIA REPAIR     TOOTH EXTRACTION     Social History:   reports that he has been smoking cigarettes. He has never used smokeless tobacco. He reports current alcohol use. He reports that he does not use drugs.  Family History  Problem Relation Age of Onset   Heart disease Father        55   Diabetes Father    Cancer Father     Medications: Patient's Medications  New Prescriptions   No medications on file  Previous Medications   ALPRAZOLAM (XANAX) 0.5 MG TABLET    Take 0.5 tablets (0.25 mg total) by mouth at bedtime as needed for anxiety.   ATORVASTATIN (LIPITOR) 40 MG TABLET    Take 1 tablet (40  mg total) by mouth daily.   BETAMETHASONE DIPROPIONATE 0.05 % CREAM    Apply topically 2 (two) times daily.   CHOLECALCIFEROL (VITAMIN D) 50 MCG (2000 UT) CAPS    Take 1 capsule (2,000 Units total) by mouth daily.   CYANOCOBALAMIN (,VITAMIN B-12,) 1000 MCG/ML INJECTION    Inject 1 mL every 14 days   ESCITALOPRAM (LEXAPRO) 10 MG TABLET    TAKE 1 TABLET BY MOUTH DAILY   HYDROCHLOROTHIAZIDE (HYDRODIURIL) 25 MG TABLET    Take 1 tablet (25 mg total) by mouth daily.   LOSARTAN (COZAAR) 50 MG TABLET    Take 1 tablet (50 mg total) by mouth daily.   MULTIPLE VITAMINS-MINERALS (PRESERVISION AREDS 2 PO)    Take 1 capsule by  mouth daily.   SYRINGE/NEEDLE, DISP, (SYRINGE 3CC/25GX1") 25G X 1" 3 ML MISC    Inject 1 Device into the muscle every 14 (fourteen) days. Use monthly for b12 injections E53.8  Modified Medications   No medications on file  Discontinued Medications   No medications on file    Physical Exam:  Vitals:   09/22/22 0813  BP: 136/84  Pulse: 61  Resp: 17  Temp: 98.1 F (36.7 C)  TempSrc: Temporal  Weight: 151 lb 9.6 oz (68.8 kg)  Height: '5\' 8"'$  (1.727 m)   Body mass index is 23.05 kg/m. Wt Readings from Last 3 Encounters:  09/22/22 151 lb 9.6 oz (68.8 kg)  08/11/22 149 lb (67.6 kg)  04/14/22 149 lb (67.6 kg)    Physical Exam Constitutional:      General: He is not in acute distress.    Appearance: He is well-developed. He is not diaphoretic.  HENT:     Head: Normocephalic and atraumatic.     Right Ear: External ear normal.     Left Ear: External ear normal.     Mouth/Throat:     Pharynx: No oropharyngeal exudate.  Eyes:     Conjunctiva/sclera: Conjunctivae normal.     Pupils: Pupils are equal, round, and reactive to light.  Cardiovascular:     Rate and Rhythm: Normal rate and regular rhythm.     Heart sounds: Normal heart sounds.  Pulmonary:     Effort: Pulmonary effort is normal.     Breath sounds: Normal breath sounds.  Abdominal:     General: Bowel sounds are normal.     Palpations: Abdomen is soft.  Musculoskeletal:        General: No tenderness.     Cervical back: Normal range of motion and neck supple.     Right lower leg: No edema.     Left lower leg: No edema.  Skin:    General: Skin is warm and dry.  Neurological:     Mental Status: He is alert and oriented to person, place, and time.     Labs reviewed: Basic Metabolic Panel: Recent Labs    03/10/22 0851 04/14/22 0858  NA 139 138  K 4.1 4.1  CL 103 103  CO2 27 28  GLUCOSE 96 92  BUN 15 15  CREATININE 1.09 0.88  CALCIUM 9.5 9.1   Liver Function Tests: Recent Labs    03/10/22 0851  AST  20  ALT 18  BILITOT 0.6  PROT 7.0   No results for input(s): "LIPASE", "AMYLASE" in the last 8760 hours. No results for input(s): "AMMONIA" in the last 8760 hours. CBC: Recent Labs    03/10/22 0851  WBC 10.0  NEUTROABS 8,220*  HGB 14.9  HCT 45.3  MCV 92.6  PLT 269   Lipid Panel: No results for input(s): "CHOL", "HDL", "LDLCALC", "TRIG", "CHOLHDL", "LDLDIRECT" in the last 8760 hours. TSH: No results for input(s): "TSH" in the last 8760 hours. A1C: Lab Results  Component Value Date   HGBA1C 5.5 03/13/2021     Assessment/Plan 1. Psychophysiological insomnia -lifestyle modifications given to help improve insomnia - ALPRAZolam (XANAX) 0.5 MG tablet; Take 0.5 tablets (0.25 mg total) by mouth at bedtime as needed for anxiety.  Dispense: 30 tablet; Refill: 2  2. Anxiety -controlled on lexapro daily with lifestyle changes  3. Essential hypertension -Blood pressure well controlled, goal bp <140/90 Continue current medications and dietary modifications follow metabolic panel - CMP with eGFR([facility]) - CBC with Differential/Platelet  4. Prostate cancer [facility]) -last follow up with urology was Feb 2022, after MRI felt like everything was stable so opted not to follow up last year. Will folow up PSA today - PSA  5. Mixed hyperlipidemia -continues on lipitor with dietary modifications  - Lipid panel  6. Vitamin B12 deficiency -has not had injection in 2 months, will follow up b12 - Vitamin B12  7. Vitamin D deficiency Continues on supplement - Vitamin D, 25-hydroxy  8. Iron deficiency anemia secondary to inadequate dietary iron intake -not currently on supplement, will follow up iron levels.  - Iron, TIBC and Ferritin Panel    Return in about 6 months (around 03/23/2023) for routine follow up, labs at appt.  Carlos American. [place], [facility] [phone]

## 2022-09-23 DIAGNOSIS — K080 Exfoliation of teeth due to systemic causes: Secondary | ICD-10-CM | POA: Diagnosis not present

## 2022-09-23 LAB — COMPLETE METABOLIC PANEL WITH GFR
AG Ratio: 1.5 (calc) (ref 1.0–2.5)
ALT: 17 U/L (ref 9–46)
AST: 21 U/L (ref 10–35)
Albumin: 4 g/dL (ref 3.6–5.1)
Alkaline phosphatase (APISO): 75 U/L (ref 35–144)
BUN: 15 mg/dL (ref 7–25)
CO2: 29 mmol/L (ref 20–32)
Calcium: 9.3 mg/dL (ref 8.6–10.3)
Chloride: 103 mmol/L (ref 98–110)
Creat: 0.93 mg/dL (ref 0.70–1.28)
Globulin: 2.7 g/dL (calc) (ref 1.9–3.7)
Glucose, Bld: 94 mg/dL (ref 65–99)
Potassium: 4.3 mmol/L (ref 3.5–5.3)
Sodium: 139 mmol/L (ref 135–146)
Total Bilirubin: 0.6 mg/dL (ref 0.2–1.2)
Total Protein: 6.7 g/dL (ref 6.1–8.1)
eGFR: 85 mL/min/{1.73_m2} (ref >=60)

## 2022-09-23 LAB — LIPID PANEL
Cholesterol: 142 mg/dL (ref <200)
HDL: 44 mg/dL (ref >=40)
LDL Cholesterol (Calc): 82 mg/dL (calc)
Non-HDL Cholesterol (Calc): 98 mg/dL (calc) (ref <130)
Total CHOL/HDL Ratio: 3.2 (calc) (ref <5.0)
Triglycerides: 82 mg/dL (ref <150)

## 2022-09-23 LAB — VITAMIN D 25 HYDROXY (VIT D DEFICIENCY, FRACTURES): Vit D, 25-Hydroxy: 93 ng/mL (ref 30–100)

## 2022-09-23 LAB — CBC WITH DIFFERENTIAL/PLATELET
Absolute Monocytes: 502 cells/uL (ref 200–950)
Basophils Absolute: 33 cells/uL (ref 0–200)
Basophils Relative: 0.5 %
Eosinophils Absolute: 73 cells/uL (ref 15–500)
Eosinophils Relative: 1.1 %
HCT: 39.1 % (ref 38.5–50.0)
Hemoglobin: 12.7 g/dL — ABNORMAL LOW (ref 13.2–17.1)
Lymphs Abs: 1505 cells/uL (ref 850–3900)
MCH: 29.9 pg (ref 27.0–33.0)
MCHC: 32.5 g/dL (ref 32.0–36.0)
MCV: 92 fL (ref 80.0–100.0)
MPV: 10.3 fL (ref 7.5–12.5)
Monocytes Relative: 7.6 %
Neutro Abs: 4488 cells/uL (ref 1500–7800)
Neutrophils Relative %: 68 %
Platelets: 258 10*3/uL (ref 140–400)
RBC: 4.25 10*6/uL (ref 4.20–5.80)
RDW: 13.5 % (ref 11.0–15.0)
Total Lymphocyte: 22.8 %
WBC: 6.6 10*3/uL (ref 3.8–10.8)

## 2022-09-23 LAB — IRON,TIBC AND FERRITIN PANEL
%SAT: 18 % (calc) — ABNORMAL LOW (ref 20–48)
Ferritin: 10 ng/mL — ABNORMAL LOW (ref 24–380)
Iron: 71 ug/dL (ref 50–180)
TIBC: 400 mcg/dL (calc) (ref 250–425)

## 2022-09-23 LAB — PSA: PSA: 2.57 ng/mL (ref <=4.00)

## 2022-09-23 LAB — VITAMIN B12: Vitamin B-12: 308 pg/mL (ref 200–1100)

## 2022-09-24 ENCOUNTER — Encounter: Payer: Self-pay | Admitting: Nurse Practitioner

## 2022-09-25 ENCOUNTER — Other Ambulatory Visit: Payer: Self-pay | Admitting: *Deleted

## 2022-09-25 DIAGNOSIS — D508 Other iron deficiency anemias: Secondary | ICD-10-CM

## 2022-10-02 DIAGNOSIS — K080 Exfoliation of teeth due to systemic causes: Secondary | ICD-10-CM | POA: Diagnosis not present

## 2022-11-08 ENCOUNTER — Other Ambulatory Visit: Payer: Self-pay | Admitting: Nurse Practitioner

## 2022-11-08 DIAGNOSIS — F419 Anxiety disorder, unspecified: Secondary | ICD-10-CM

## 2022-11-10 NOTE — Telephone Encounter (Signed)
Patient has request refill on medication Escitalopram '10mg'$ . Patient only given 60 tablets and zero refills. Medication pend and sent to PCP Dewaine Oats Carlos American, NP for approval.

## 2022-11-12 ENCOUNTER — Other Ambulatory Visit: Payer: Self-pay | Admitting: Nurse Practitioner

## 2022-11-12 DIAGNOSIS — I10 Essential (primary) hypertension: Secondary | ICD-10-CM

## 2023-01-07 ENCOUNTER — Other Ambulatory Visit: Payer: Self-pay | Admitting: Nurse Practitioner

## 2023-01-07 DIAGNOSIS — K080 Exfoliation of teeth due to systemic causes: Secondary | ICD-10-CM | POA: Diagnosis not present

## 2023-01-07 DIAGNOSIS — F419 Anxiety disorder, unspecified: Secondary | ICD-10-CM

## 2023-01-07 NOTE — Telephone Encounter (Signed)
Patient has request refill on medication Lexapro 10mg . Patient was only given 2 month supply on last refill. Medication pend and sent to PCP Jackson Perry Jackson Harvey, NP for further refill.

## 2023-03-05 ENCOUNTER — Other Ambulatory Visit: Payer: Self-pay | Admitting: Nurse Practitioner

## 2023-03-05 DIAGNOSIS — E782 Mixed hyperlipidemia: Secondary | ICD-10-CM

## 2023-03-05 DIAGNOSIS — I10 Essential (primary) hypertension: Secondary | ICD-10-CM

## 2023-03-13 ENCOUNTER — Other Ambulatory Visit: Payer: Self-pay | Admitting: Nurse Practitioner

## 2023-03-13 DIAGNOSIS — F5104 Psychophysiologic insomnia: Secondary | ICD-10-CM

## 2023-03-16 NOTE — Telephone Encounter (Signed)
Patient has request refill on medication Xanax. Patient last refill 09/24/2021. Patient has Non Opioid Contact on file dated 06/12/2023. Update Contract added to patient appointment note. Medication pend and sent to PCP Janyth Contes Janene Harvey, NP for approval.

## 2023-04-03 ENCOUNTER — Ambulatory Visit (INDEPENDENT_AMBULATORY_CARE_PROVIDER_SITE_OTHER): Payer: Medicare Other | Admitting: Nurse Practitioner

## 2023-04-03 ENCOUNTER — Encounter: Payer: Self-pay | Admitting: Nurse Practitioner

## 2023-04-03 VITALS — BP 138/88 | HR 66 | Temp 97.3°F | Ht 68.0 in | Wt 155.0 lb

## 2023-04-03 DIAGNOSIS — D508 Other iron deficiency anemias: Secondary | ICD-10-CM | POA: Diagnosis not present

## 2023-04-03 DIAGNOSIS — E782 Mixed hyperlipidemia: Secondary | ICD-10-CM

## 2023-04-03 DIAGNOSIS — F419 Anxiety disorder, unspecified: Secondary | ICD-10-CM | POA: Diagnosis not present

## 2023-04-03 DIAGNOSIS — I2510 Atherosclerotic heart disease of native coronary artery without angina pectoris: Secondary | ICD-10-CM

## 2023-04-03 DIAGNOSIS — L309 Dermatitis, unspecified: Secondary | ICD-10-CM | POA: Diagnosis not present

## 2023-04-03 DIAGNOSIS — I10 Essential (primary) hypertension: Secondary | ICD-10-CM

## 2023-04-03 DIAGNOSIS — C61 Malignant neoplasm of prostate: Secondary | ICD-10-CM

## 2023-04-03 DIAGNOSIS — H6123 Impacted cerumen, bilateral: Secondary | ICD-10-CM | POA: Diagnosis not present

## 2023-04-03 DIAGNOSIS — F5104 Psychophysiologic insomnia: Secondary | ICD-10-CM

## 2023-04-03 DIAGNOSIS — H6122 Impacted cerumen, left ear: Secondary | ICD-10-CM

## 2023-04-03 DIAGNOSIS — E538 Deficiency of other specified B group vitamins: Secondary | ICD-10-CM

## 2023-04-03 DIAGNOSIS — F17209 Nicotine dependence, unspecified, with unspecified nicotine-induced disorders: Secondary | ICD-10-CM

## 2023-04-03 LAB — CBC WITH DIFFERENTIAL/PLATELET
Absolute Monocytes: 525 cells/uL (ref 200–950)
Basophils Absolute: 38 cells/uL (ref 0–200)
Basophils Relative: 0.6 %
Eosinophils Absolute: 83 cells/uL (ref 15–500)
Eosinophils Relative: 1.3 %
HCT: 41.8 % (ref 38.5–50.0)
Hemoglobin: 13.8 g/dL (ref 13.2–17.1)
Lymphs Abs: 1197 cells/uL (ref 850–3900)
MCH: 30.9 pg (ref 27.0–33.0)
MCHC: 33 g/dL (ref 32.0–36.0)
MCV: 93.7 fL (ref 80.0–100.0)
MPV: 10.1 fL (ref 7.5–12.5)
Monocytes Relative: 8.2 %
Neutro Abs: 4557 cells/uL (ref 1500–7800)
Neutrophils Relative %: 71.2 %
Platelets: 239 10*3/uL (ref 140–400)
RBC: 4.46 10*6/uL (ref 4.20–5.80)
RDW: 13 % (ref 11.0–15.0)
Total Lymphocyte: 18.7 %
WBC: 6.4 10*3/uL (ref 3.8–10.8)

## 2023-04-03 MED ORDER — BETAMETHASONE DIPROPIONATE 0.05 % EX CREA: TOPICAL_CREAM | Freq: Two times a day (BID) | CUTANEOUS | 11 refills | Status: DC

## 2023-04-03 MED ORDER — ESCITALOPRAM OXALATE 10 MG PO TABS
10.0000 mg | ORAL_TABLET | Freq: Every day | ORAL | 3 refills | Status: DC
Start: 2023-04-03 — End: 2023-10-05

## 2023-04-03 MED ORDER — ALPRAZOLAM 0.5 MG PO TABS
0.2500 mg | ORAL_TABLET | Freq: Every evening | ORAL | 2 refills | Status: DC | PRN
Start: 2023-04-03 — End: 2023-10-05

## 2023-04-03 MED ORDER — ASPIRIN 325 MG PO TBEC
325.0000 mg | DELAYED_RELEASE_TABLET | Freq: Every day | ORAL | Status: DC
Start: 2023-04-03 — End: 2024-04-04

## 2023-04-03 MED ORDER — LOSARTAN POTASSIUM 50 MG PO TABS
50.0000 mg | ORAL_TABLET | Freq: Every day | ORAL | 3 refills | Status: DC
Start: 2023-04-03 — End: 2023-10-05

## 2023-04-03 NOTE — Patient Instructions (Signed)
To use heating pad to neck and then can follow with rub.

## 2023-04-03 NOTE — Progress Notes (Signed)
Careteam: Patient Care Team: Sharon Seller, NP as PCP - General (Geriatric Medicine) Mardella Layman, MD as Consulting Physician (Gastroenterology) Karie Soda, MD as Consulting Physician (General Surgery) Rene Paci, MD as Consulting Physician (Urology)  PLACE OF SERVICE:  [facility]  Advanced Directive information Does Patient Have a Medical Advance Directive?: Yes, Type of Advance Directive: Healthcare Power of [place];Living will, Does patient want to make changes to medical advance directive?: No - Patient declined  Allergies  Allergen Reactions   Bupropion Hcl    Shrimp [Shellfish Allergy]     Chief Complaint  Patient presents with   Medical Management of Chronic Issues    6 month follow-up. AWV pending for 06/12/23. Discuss need for additional covid boosters. [facility] verified. Discuss getting additional refills on Xanax RX. Discuss b12 injection. Patient c/o ache in neck and shoulders, questions if related to arthritis.       HPI: Patient is a 77 y.o. male for routine follow up.   B12 def- used to have a significant decline in function when he was due for injection but now does not notice a difference. Will go 4-6 weeks between  Iron def anemia- no abnormal stools, palpitations or fatigue.   He has gained 5 lbs but has not changed the way he eats.   Mood has been doing good. Has to used half xanax every other night and sometimes every night. Getting about 7 hours of sleep.   Reports numbness going down right neck and shoulder. Feels his neck creek and crack.  No numbness or tingling in hands or fingers. He does have arthritis.   No difference in urination after diagnosis with prostate cancer 5 years ago. He does not wish to do anything at this time, plans to watch and wait. Following PSA yearly.   He continues to smoke twice daily, not waiting to quit at this time.   Review of Systems:  Review of Systems  Constitutional:  Negative for  chills, fever and weight loss.  HENT:  Negative for tinnitus.   Respiratory:  Negative for cough, sputum production and shortness of breath.   Cardiovascular:  Negative for chest pain, palpitations and leg swelling.  Gastrointestinal:  Negative for abdominal pain, constipation, diarrhea and heartburn.  Genitourinary:  Negative for dysuria, frequency and urgency.  Musculoskeletal:  Negative for back pain, falls, joint pain and myalgias.  Skin: Negative.   Neurological:  Positive for sensory change. Negative for dizziness and headaches.  Psychiatric/Behavioral:  Negative for depression and memory loss. The patient is nervous/anxious and has insomnia.     Past Medical History:  Diagnosis Date   Anemia    Arthritis    Depression    High blood pressure    High cholesterol    Panic attacks    Prostate cancer (HCC) 10/14/2018   Past Surgical History:  Procedure Laterality Date   CATARACT EXTRACTION     CHOLECYSTECTOMY     HERNIA REPAIR     TOOTH EXTRACTION     Social History:   reports that he has been smoking cigarettes. He has never used smokeless tobacco. He reports current alcohol use. He reports that he does not use drugs.  Family History  Problem Relation Age of Onset   Heart disease Father        59   Diabetes Father    Cancer Father     Medications: Patient's Medications  New Prescriptions   No medications on file  Previous Medications  ALPRAZOLAM (XANAX) 0.5 MG TABLET    TAKE 1/2 TABLET BY MOUTH EVERY NIGHT AT BEDTIME AS NEEDED FOR ANXIETY   ATORVASTATIN (LIPITOR) 40 MG TABLET    TAKE 1 TABLET BY MOUTH DAILY   BETAMETHASONE DIPROPIONATE 0.05 % CREAM    Apply topically 2 (two) times daily.   CHOLECALCIFEROL (VITAMIN D) 50 MCG (2000 UT) CAPS    Take 1 capsule (2,000 Units total) by mouth daily.   CYANOCOBALAMIN (,VITAMIN B-12,) 1000 MCG/ML INJECTION    Inject 1 mL every 14 days   ESCITALOPRAM (LEXAPRO) 10 MG TABLET    TAKE 1 TABLET BY MOUTH DAILY    HYDROCHLOROTHIAZIDE (HYDRODIURIL) 25 MG TABLET    TAKE 1 TABLET BY MOUTH DAILY   LOSARTAN (COZAAR) 50 MG TABLET    TAKE 1 TABLET BY MOUTH DAILY   MULTIPLE VITAMINS-MINERALS (PRESERVISION AREDS 2 PO)    Take 1 capsule by mouth daily.   SYRINGE/NEEDLE, DISP, (SYRINGE 3CC/25GX1") 25G X 1" 3 ML MISC    Inject 1 Device into the muscle every 14 (fourteen) days. Use monthly for b12 injections E53.8  Modified Medications   No medications on file  Discontinued Medications   No medications on file    Physical Exam:  Vitals:   04/03/23 0803 04/03/23 0810  BP: (!) 138/90 138/88  Pulse: 66   Temp: (!) 97.3 F (36.3 C)   TempSrc: Temporal   SpO2: 96%   Weight: 155 lb (70.3 kg)   Height: 5\' 8"  (1.727 m)    Body mass index is 23.57 kg/m. Wt Readings from Last 3 Encounters:  04/03/23 155 lb (70.3 kg)  09/22/22 151 lb 9.6 oz (68.8 kg)  08/11/22 149 lb (67.6 kg)    Physical Exam Constitutional:      General: He is not in acute distress.    Appearance: He is well-developed. He is not diaphoretic.  HENT:     Head: Normocephalic and atraumatic.     Right Ear: External ear normal.     Left Ear: External ear normal. There is impacted cerumen.     Mouth/Throat:     Pharynx: No oropharyngeal exudate.  Eyes:     Conjunctiva/sclera: Conjunctivae normal.     Pupils: Pupils are equal, round, and reactive to light.  Cardiovascular:     Rate and Rhythm: Normal rate and regular rhythm.     Heart sounds: Normal heart sounds.  Pulmonary:     Effort: Pulmonary effort is normal.     Breath sounds: Normal breath sounds.  Abdominal:     General: Bowel sounds are normal.     Palpations: Abdomen is soft.  Musculoskeletal:        General: No tenderness.     Cervical back: Normal range of motion and neck supple.     Right lower leg: No edema.     Left lower leg: No edema.  Skin:    General: Skin is warm and dry.  Neurological:     Mental Status: He is alert and oriented to person, place, and time.      Labs reviewed: Basic Metabolic Panel: Recent Labs    04/14/22 0858 09/22/22 0835  NA 138 139  K 4.1 4.3  CL 103 103  CO2 28 29  GLUCOSE 92 94  BUN 15 15  CREATININE 0.88 0.93  CALCIUM 9.1 9.3   Liver Function Tests: Recent Labs    09/22/22 0835  AST 21  ALT 17  BILITOT 0.6  PROT 6.7  No results for input(s): "LIPASE", "AMYLASE" in the last 8760 hours. No results for input(s): "AMMONIA" in the last 8760 hours. CBC: Recent Labs    09/22/22 0835  WBC 6.6  NEUTROABS 4,488  HGB 12.7*  HCT 39.1  MCV 92.0  PLT 258   Lipid Panel: Recent Labs    09/22/22 0835  CHOL 142  HDL 44  LDLCALC 82  TRIG 82  CHOLHDL 3.2   TSH: No results for input(s): "TSH" in the last 8760 hours. A1C: Lab Results  Component Value Date   HGBA1C 5.5 03/13/2021     Assessment/Plan 1. Eczema, unspecified type Controlled at this time.  - betamethasone dipropionate 0.05 % cream; Apply topically 2 (two) times daily.  Dispense: 45 g; Refill: 11  2. Anxiety -stable, continues on lexapro - escitalopram (LEXAPRO) 10 MG tablet; Take 1 tablet (10 mg total) by mouth daily.  Dispense: 90 tablet; Refill: 3  3. Essential hypertension -Blood pressure well controlled, goal bp <140/90 Continue current medications and dietary modifications follow metabolic panel - losartan (COZAAR) 50 MG tablet; Take 1 tablet (50 mg total) by mouth daily.  Dispense: 90 tablet; Refill: 3 - COMPLETE METABOLIC PANEL WITH GFR - CBC with Differential/Platelet  4. Iron deficiency anemia secondary to inadequate dietary iron intake -continues on iron supplement dialy - CBC with Differential/Platelet - Iron, TIBC and Ferritin Panel  5. Vitamin B12 deficiency -continues on supplement, only using every 4-6 weeks. Will follow up lab - Vitamin B12  6. Mixed hyperlipidemia -continue on lipitor 40 mg daily  - Lipid panel  7. Psychophysiological insomnia - ALPRAZolam (XANAX) 0.5 MG tablet; Take 0.5 tablets  (0.25 mg total) by mouth at bedtime as needed for anxiety.  Dispense: 30 tablet; Refill: 2  8. Impacted cerumen of left ear -lavage and curette used for removal. Pt tolerated well.   9. Tobacco use disorder, continuous -encouraged cessation but not ready to quit.   10. Prostate cancer (HCC) -PSA yearly, stable on last lab  11. Coronary artery disease involving native coronary artery of native heart without angina pectoris No current chest pains, he reports he has been taking regular asa 325 mg for years  - aspirin EC 325 MG tablet; Take 1 tablet (325 mg total) by mouth daily. Swallow whole.  Return in about 6 months (around 10/04/2023) for routine follow up .  Janene Harvey. Biagio Borg [facility] [phone]

## 2023-04-14 DIAGNOSIS — K080 Exfoliation of teeth due to systemic causes: Secondary | ICD-10-CM | POA: Diagnosis not present

## 2023-05-08 ENCOUNTER — Other Ambulatory Visit: Payer: Self-pay | Admitting: Nurse Practitioner

## 2023-05-08 DIAGNOSIS — E538 Deficiency of other specified B group vitamins: Secondary | ICD-10-CM

## 2023-06-10 ENCOUNTER — Ambulatory Visit: Payer: Medicare Other | Admitting: Nurse Practitioner

## 2023-06-10 DIAGNOSIS — Z0000 Encounter for general adult medical examination without abnormal findings: Secondary | ICD-10-CM

## 2023-06-10 NOTE — Patient Instructions (Signed)
  Jackson Perry , Thank you for taking time to come for your Medicare Wellness Visit. I appreciate your ongoing commitment to your health goals. Please review the following plan we discussed and let me know if I can assist you in the future.   These are the goals we discussed:  Goals   None     This is a list of the screening recommended for you and due dates:  Health Maintenance  Topic Date Due   COVID-19 Vaccine (8 - 2023-24 season) 05/10/2023   Medicare Annual Wellness Visit  06/09/2024   DTaP/Tdap/Td vaccine (2 - Td or Tdap) 03/28/2030   Pneumonia Vaccine  Completed   Flu Shot  Completed   Hepatitis C Screening  Completed   Zoster (Shingles) Vaccine  Completed   HPV Vaccine  Aged Out   Colon Cancer Screening  Discontinued

## 2023-06-10 NOTE — Progress Notes (Signed)
Subjective:   Jackson Perry is a 77 y.o. male who presents for Medicare Annual/Subsequent preventive examination.  Visit Complete: Virtual  I connected with  Jackson Perry on 06/10/23 by a video and audio enabled telemedicine application and verified that I am speaking with the correct person using two identifiers.  Patient Location: Home  Provider Location: Office/Clinic  I discussed the limitations of evaluation and management by telemedicine. The patient expressed understanding and agreed to proceed.   Cardiac Risk Factors include: advanced age (>77men, >49 women);male gender;dyslipidemia     Objective:    There were no vitals filed for this visit. There is no height or weight on file to calculate BMI.     04/03/2023    8:18 AM 09/22/2022    8:13 AM 09/18/2022    9:27 AM 05/16/2022    3:11 PM 03/10/2022    8:01 AM 05/14/2021   11:37 AM 03/13/2021    8:33 AM  Advanced Directives  Does Patient Have a Medical Advance Directive? Yes Yes Yes Yes Yes Yes Yes  Type of Estate agent of [place];Living will Healthcare Power of [place];Living will;Out of facility DNR (pink MOST or yellow form) Healthcare Power of [employer] Power of [employer] Power of [employer] Power of [employer] Power of [place];Living will;Out of facility DNR (pink MOST or yellow form)  Does patient want to make changes to medical advance directive? No - Patient declined No - Patient declined No - Patient declined No - Patient declined No - Patient declined No - Patient declined No - Patient declined  Copy of Healthcare Power of Attorney in Chart? No - copy requested Yes - validated most recent copy scanned in chart (See row information) Yes - validated most recent copy scanned in chart (See row information) Yes - validated most recent copy scanned in chart (See row information) Yes - validated most recent copy scanned in chart (See row information)  No - copy  requested    Current Medications (verified) Outpatient Encounter Medications as of 06/10/2023  Medication Sig   ALPRAZolam (XANAX) 0.5 MG tablet Take 0.5 tablets (0.25 mg total) by mouth at bedtime as needed for anxiety.   aspirin EC 325 MG tablet Take 1 tablet (325 mg total) by mouth daily. Swallow whole.   atorvastatin (LIPITOR) 40 MG tablet TAKE 1 TABLET BY MOUTH DAILY   betamethasone dipropionate 0.05 % cream Apply topically 2 (two) times daily.   Cholecalciferol (VITAMIN D) 50 MCG (2000 UT) CAPS Take 1 capsule (2,000 Units total) by mouth daily.   cyanocobalamin (VITAMIN B12) 1000 MCG/ML injection INJECT 1 ML INTRAMUSCULARLY EVERY 14 DAYS   escitalopram (LEXAPRO) 10 MG tablet Take 1 tablet (10 mg total) by mouth daily.   hydrochlorothiazide (HYDRODIURIL) 25 MG tablet TAKE 1 TABLET BY MOUTH DAILY   losartan (COZAAR) 50 MG tablet Take 1 tablet (50 mg total) by mouth daily.   Multiple Vitamins-Minerals (PRESERVISION AREDS 2 PO) Take 1 capsule by mouth daily.   SYRINGE-NEEDLE, DISP, 3 ML (B-D 3CC LUER-LOK SYR 25GX1") 25G X 1" 3 ML MISC INJECT B12 INTRAMUSCULARLY EVERY 14 DAYS   No facility-administered encounter medications on file as of 06/10/2023.    Allergies (verified) Bupropion hcl and Shrimp [shellfish allergy]   History: Past Medical History:  Diagnosis Date   Anemia    Arthritis    Depression    High blood pressure    High cholesterol    Panic attacks    Prostate cancer (  HCC) 10/14/2018   Past Surgical History:  Procedure Laterality Date   CATARACT EXTRACTION     CHOLECYSTECTOMY     HERNIA REPAIR     TOOTH EXTRACTION     Family History  Problem Relation Age of Onset   Heart disease Father        56   Diabetes Father    Cancer Father    Social History   Socioeconomic History   Marital status: Married    Spouse name: Not on file   Number of children: Not on file   Years of education: Not on file   Highest education level: Not on file  Occupational History    Not on file  Tobacco Use   Smoking status: Every Day    Types: Cigarettes   Smokeless tobacco: Never   Tobacco comments:    2 per day, sometimes patient will go 1 week or longer without any   Vaping Use   Vaping status: Never Used  Substance and Sexual Activity   Alcohol use: Yes    Comment: 2 drinks daily- [place]   Drug use: Never   Sexual activity: Not Currently  Other Topics Concern   Not on file  Social History Narrative   Diet: Blank      Do you drink/ eat things with caffeine?  Coffee      Marital status:    M                           What year were you married ? 1969      Do you live in a house, apartment,assistred living, condo, trailer, etc.)? Condo      Is it one or more stories? 1      How many persons live in your home ? 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   Yes                           Type & how often 1 and 1/2- 2 mile brisk walk      ADVANCED DIRECTIVES (Please bring copies)      Do you have a living will? Yes      Do you have a DNR form?  Yes                     If not, do you want to discuss one?       Do you have signed POA?HPOA forms?   Yes              If so, please bring to your appointment      FUNCTIONAL STATUS- To be completed by Spouse / child / Staff       Do you have difficulty bathing or dressing yourself ?  No      Do you have difficulty preparing food or eating ? No      Do you have difficulty managing your mediation ? No      Do you have difficulty managing your finances ? No      Do you have difficulty affording your medication ? No  Social Determinants of Health   Financial Resource Strain: Not on file  Food Insecurity: Not on file  Transportation Needs: Not on file  Physical Activity: Not on file  Stress: Not on file  Social Connections: Not on  file    Tobacco Counseling Ready to quit: Not Answered Counseling given: Not Answered Tobacco comments: 2 per day, sometimes patient will go 1 week or longer without any    Clinical Intake:  Pre-visit preparation completed: Yes  Pain : No/denies pain     BMI - recorded: 23 Nutritional Status: BMI of 19-24  Normal Diabetes: No  How often do you need to have someone help you when you read instructions, pamphlets, or other written materials from your doctor or pharmacy?: 1 - Never         Activities of Daily Living    06/10/2023    2:28 PM 06/10/2023    2:01 PM  In your present state of health, do you have any difficulty performing the following activities:  Hearing?  0  Vision?  0  Difficulty concentrating or making decisions?  0  Walking or climbing stairs?  0  Dressing or bathing?  0  Doing errands, shopping?  0  Preparing Food and eating ? N   Using the Toilet? N   In the past six months, have you accidently leaked urine? N   Do you have problems with loss of bowel control? N   Managing your Medications? N   Managing your Finances? N     Patient Care Team: Sharon Seller, NP as PCP - General (Geriatric Medicine) Mardella Layman, MD as Consulting Physician (Gastroenterology) Karie Soda, MD as Consulting Physician (General Surgery) Rene Paci, MD as Consulting Physician (Urology)  Indicate any recent Medical Services you may have received from other than [facility] providers in the past year (date may be approximate).     Assessment:   This is a routine wellness examination for Jackson Perry.  Hearing/Vision screen No results found.   Goals Addressed   None    Depression Screen    09/22/2022    8:12 AM 08/11/2022    9:22 AM 05/27/2022    8:16 AM 03/10/2022    8:25 AM 05/14/2021   11:35 AM 09/13/2020    1:20 PM 11/22/2019   11:12 AM  PHQ 2/9 Scores  PHQ - 2 Score 0 0 0 0 0 2 0  PHQ- 9 Score      4     Fall Risk    06/10/2023    2:00 PM  04/03/2023    8:08 AM 09/22/2022    8:12 AM 08/11/2022    9:21 AM 05/27/2022    8:17 AM  Fall Risk   Falls in the past year? 0 0 0 0 1  Number falls in past yr: 0 0 0  1  Injury with Fall? 0 0 0  0  Risk for fall due to : No Fall Risks No Fall Risks History of fall(s)  History of fall(s)  Follow up Falls evaluation completed Falls evaluation completed Falls evaluation completed Falls evaluation completed Falls evaluation completed    MEDICARE RISK AT HOME:    TIMED UP AND GO:  Was the test performed?  No    Cognitive Function:        06/10/2023    2:00 PM 05/27/2022    8:13 AM 05/14/2021   11:37 AM 11/22/2019   11:10 AM 10/19/2018    8:15 AM  6CIT Screen  What Year? 0 points 0 points 0 points 0 points 0 points  What month? 0 points 0 points 0 points 0 points 0 points  What time? 0 points 0 points 0 points -- 0 points  Count back from 20 0 points 0 points 0 points 0 points 0 points  Months in reverse 0 points 0 points 0 points 0 points 0 points  Repeat phrase 0 points 0 points 4 points 0 points 0 points  Total Score 0 points 0 points 4 points  0 points    Immunizations Immunization History  Administered Date(s) Administered   Fluad Quad(high Dose 65+) 04/29/2019   Influenza, High Dose Seasonal PF 07/26/2018, 06/06/2020, 05/28/2021, 07/12/2022, 05/12/2023   Influenza,inj,Quad PF,6+ Mos 07/10/2015, 05/22/2016, 07/25/2017   Moderna Covid-19 Fall Seasonal Vaccine 26yrs & older 07/12/2022   PFIZER(Purple Top)SARS-COV-2 Vaccination 09/27/2019, 10/18/2019, 05/12/2020, 10/25/2020   Pfizer Covid-19 Vaccine Bivalent Booster 49yrs & up 07/03/2021, 02/23/2022   Pneumococcal Conjugate-13 07/03/2016   Pneumococcal Polysaccharide-23 02/04/2013   RSV,unspecified 07/25/2022, 09/24/2022   Tdap 03/28/2020   Tetanus 02/04/2013   Unspecified SARS-COV-2 Vaccination 07/20/2022   Zoster Recombinant(Shingrix) 07/25/2022, 12/11/2022    TDAP status: Up to date  Flu Vaccine status: Up to  date  Pneumococcal vaccine status: Up to date  Covid-19 vaccine status: Completed vaccines  Qualifies for Shingles Vaccine? Yes   Zostavax completed No   Shingrix Completed?: Yes  Screening Tests Health Maintenance  Topic Date Due   COVID-19 Vaccine (8 - 2023-24 season) 05/10/2023   Medicare Annual Wellness (AWV)  06/09/2024   DTaP/Tdap/Td (2 - Td or Tdap) 03/28/2030   Pneumonia Vaccine 4+ Years old  Completed   INFLUENZA VACCINE  Completed   Hepatitis C Screening  Completed   Zoster Vaccines- Shingrix  Completed   HPV VACCINES  Aged Out   Colonoscopy  Discontinued    Health Maintenance  Health Maintenance Due  Topic Date Due   COVID-19 Vaccine (8 - 2023-24 season) 05/10/2023    Colorectal cancer screening: No longer required.   Lung Cancer Screening: (Low Dose CT Chest recommended if Age 81-80 years, 20 pack-year currently smoking OR have quit w/in 15years.) does not qualify.   Lung Cancer Screening Referral: na  Additional Screening:  Hepatitis C Screening: does qualify; Completed   Vision Screening: Recommended annual ophthalmology exams for early detection of glaucoma and other disorders of the eye. Is the patient up to date with their annual eye exam?  Yes  Who is the provider or what is the name of the office in which the patient attends annual eye exams? [facility]. If pt is not established with a provider, would they like to be referred to a provider to establish care? No .   Dental Screening: Recommended annual dental exams for proper oral hygiene   Community Resource Referral / Chronic Care Management: CRR required this visit?  No   CCM required this visit?  No     Plan:     I have personally reviewed and noted the following in the patient's chart:   Medical and social history Use of alcohol, tobacco or illicit drugs  Current medications and supplements including opioid prescriptions. Patient is not currently taking opioid  prescriptions. Functional ability and status Nutritional status Physical activity Advanced directives List of other physicians Hospitalizations, surgeries, and ER visits in previous 12 months Vitals Screenings to include cognitive, depression, and falls Referrals and appointments  In addition, I have reviewed and discussed with patient certain  preventive protocols, quality metrics, and best practice recommendations. A written personalized care plan for preventive services as well as general preventive health recommendations were provided to patient.     Sharon Seller, NP   06/10/2023   After Visit Summary: (MyChart) Due to this being a telephonic visit, the after visit summary with patients personalized plan was offered to patient via MyChart

## 2023-06-10 NOTE — Progress Notes (Signed)
This service is provided via telemedicine  No vital signs collected/recorded due to the encounter was a telemedicine visit.   Location of patient (ex: home, work):  Home   Patient consents to a telephone visit:  Yes, 05/19/2022  Location of the provider (ex: office, home):  [facility]  Name of any referring provider: Sharon Seller, NP   Names of all persons participating in the telemedicine service and their role in the encounter: Tressy Kunzman B/CMA, Sharon Seller, NP , and patient  Time spent on call:  11 minutes

## 2023-06-12 ENCOUNTER — Encounter: Payer: Medicare Other | Admitting: Nurse Practitioner

## 2023-10-05 ENCOUNTER — Ambulatory Visit (INDEPENDENT_AMBULATORY_CARE_PROVIDER_SITE_OTHER): Payer: Medicare Other | Admitting: Nurse Practitioner

## 2023-10-05 ENCOUNTER — Encounter: Payer: Self-pay | Admitting: Nurse Practitioner

## 2023-10-05 VITALS — BP 122/78 | HR 86 | Temp 97.1°F | Ht 68.0 in | Wt 156.0 lb

## 2023-10-05 DIAGNOSIS — E538 Deficiency of other specified B group vitamins: Secondary | ICD-10-CM | POA: Diagnosis not present

## 2023-10-05 DIAGNOSIS — D508 Other iron deficiency anemias: Secondary | ICD-10-CM | POA: Diagnosis not present

## 2023-10-05 DIAGNOSIS — C61 Malignant neoplasm of prostate: Secondary | ICD-10-CM

## 2023-10-05 DIAGNOSIS — E782 Mixed hyperlipidemia: Secondary | ICD-10-CM | POA: Diagnosis not present

## 2023-10-05 DIAGNOSIS — I10 Essential (primary) hypertension: Secondary | ICD-10-CM

## 2023-10-05 DIAGNOSIS — F419 Anxiety disorder, unspecified: Secondary | ICD-10-CM

## 2023-10-05 DIAGNOSIS — I2510 Atherosclerotic heart disease of native coronary artery without angina pectoris: Secondary | ICD-10-CM

## 2023-10-05 DIAGNOSIS — F5104 Psychophysiologic insomnia: Secondary | ICD-10-CM

## 2023-10-05 DIAGNOSIS — D649 Anemia, unspecified: Secondary | ICD-10-CM | POA: Diagnosis not present

## 2023-10-05 MED ORDER — ALPRAZOLAM 0.5 MG PO TABS
0.2500 mg | ORAL_TABLET | Freq: Every evening | ORAL | 2 refills | Status: DC | PRN
Start: 2023-10-05 — End: 2024-04-04

## 2023-10-05 MED ORDER — HYDROCHLOROTHIAZIDE 25 MG PO TABS
25.0000 mg | ORAL_TABLET | Freq: Every day | ORAL | 3 refills | Status: AC
Start: 2023-10-05 — End: ?

## 2023-10-05 MED ORDER — ESCITALOPRAM OXALATE 10 MG PO TABS
10.0000 mg | ORAL_TABLET | Freq: Every day | ORAL | 3 refills | Status: AC
Start: 2023-10-05 — End: ?

## 2023-10-05 MED ORDER — LOSARTAN POTASSIUM 50 MG PO TABS: 50.0000 mg | ORAL_TABLET | Freq: Every day | ORAL | 3 refills | Status: DC

## 2023-10-05 NOTE — Progress Notes (Signed)
Careteam: Patient Care Team: Sharon Seller, NP as PCP - General (Geriatric Medicine) Mardella Layman, MD as Consulting Physician (Gastroenterology) Karie Soda, MD as Consulting Physician (General Surgery) Rene Paci, MD as Consulting Physician (Urology)  PLACE OF SERVICE:  [facility]  Advanced Directive information Does Patient Have a Medical Advance Directive?: Yes, Type of Advance Directive: Healthcare Power of [place];Living will, Does patient want to make changes to medical advance directive?: No - Patient declined  Allergies  Allergen Reactions   Bupropion Hcl    Shrimp [Shellfish Allergy]     Chief Complaint  Patient presents with   Medical Management of Chronic Issues    6 month follow-up. Moderate fall risk      HPI: Patient is a 78 y.o. male for routine follow up.  He had a fall in the house 7-8 days ago Hit his right shoulder and was a bad bruise. Had full range of motion and everyday has gotten better.  In 2022 he had a fall and hit rocks to the back, he has current pain in his neck and shoulder from that. Continues to have discomfort but "can live with it"  Reports he notices his balance is not as good as it used to be.  He walks routinely.  Does not feel like he needs PT.   Anxiety- well controlled  Sleeping well.   Review of Systems:  Review of Systems  Constitutional:  Negative for chills, fever and weight loss.  HENT:  Negative for tinnitus.   Respiratory:  Negative for cough, sputum production and shortness of breath.   Cardiovascular:  Negative for chest pain, palpitations and leg swelling.  Gastrointestinal:  Negative for abdominal pain, constipation, diarrhea and heartburn.  Genitourinary:  Negative for dysuria, frequency and urgency.  Musculoskeletal:  Positive for joint pain and myalgias. Negative for back pain and falls.  Skin: Negative.   Neurological:  Negative for dizziness and headaches.   Psychiatric/Behavioral:  Negative for depression and memory loss. The patient does not have insomnia.     Past Medical History:  Diagnosis Date   Anemia    Arthritis    Depression    High blood pressure    High cholesterol    Panic attacks    Prostate cancer (HCC) 10/14/2018   Past Surgical History:  Procedure Laterality Date   CATARACT EXTRACTION     CHOLECYSTECTOMY     HERNIA REPAIR     TOOTH EXTRACTION     Social History:   reports that he has been smoking cigarettes. He has never used smokeless tobacco. He reports current alcohol use. He reports that he does not use drugs.  Family History  Problem Relation Age of Onset   Heart disease Father        46   Diabetes Father    Cancer Father     Medications: Patient's Medications  New Prescriptions   No medications on file  Previous Medications   ALPRAZOLAM (XANAX) 0.5 MG TABLET    Take 0.5 tablets (0.25 mg total) by mouth at bedtime as needed for anxiety.   ASPIRIN EC 325 MG TABLET    Take 1 tablet (325 mg total) by mouth daily. Swallow whole.   ATORVASTATIN (LIPITOR) 40 MG TABLET    TAKE 1 TABLET BY MOUTH DAILY   BETAMETHASONE DIPROPIONATE 0.05 % CREAM    Apply topically 2 (two) times daily.   CYANOCOBALAMIN (VITAMIN B12) 1000 MCG/ML INJECTION    INJECT 1 ML INTRAMUSCULARLY  EVERY 14 DAYS   ESCITALOPRAM (LEXAPRO) 10 MG TABLET    Take 1 tablet (10 mg total) by mouth daily.   HYDROCHLOROTHIAZIDE (HYDRODIURIL) 25 MG TABLET    TAKE 1 TABLET BY MOUTH DAILY   LOSARTAN (COZAAR) 50 MG TABLET    Take 1 tablet (50 mg total) by mouth daily.   MULTIPLE VITAMINS-MINERALS (PRESERVISION AREDS 2 PO)    Take 1 capsule by mouth daily.   SYRINGE-NEEDLE, DISP, 3 ML (B-D 3CC LUER-LOK SYR 25GX1") 25G X 1" 3 ML MISC    INJECT B12 INTRAMUSCULARLY EVERY 14 DAYS  Modified Medications   No medications on file  Discontinued Medications   CHOLECALCIFEROL (VITAMIN D) 50 MCG (2000 UT) CAPS    Take 1 capsule (2,000 Units total) by mouth daily.     Physical Exam:  Vitals:   10/05/23 0815  BP: 122/78  Pulse: 86  Temp: (!) 97.1 F (36.2 C)  TempSrc: Temporal  Weight: 156 lb (70.8 kg)  Height: 5\' 8"  (1.727 m)   Body mass index is 23.72 kg/m. Wt Readings from Last 3 Encounters:  10/05/23 156 lb (70.8 kg)  04/03/23 155 lb (70.3 kg)  09/22/22 151 lb 9.6 oz (68.8 kg)    Physical Exam Constitutional:      General: He is not in acute distress.    Appearance: He is well-developed. He is not diaphoretic.  HENT:     Head: Normocephalic and atraumatic.     Right Ear: External ear normal.     Left Ear: External ear normal.     Mouth/Throat:     Pharynx: No oropharyngeal exudate.  Eyes:     Conjunctiva/sclera: Conjunctivae normal.     Pupils: Pupils are equal, round, and reactive to light.  Cardiovascular:     Rate and Rhythm: Normal rate and regular rhythm.     Heart sounds: Normal heart sounds.  Pulmonary:     Effort: Pulmonary effort is normal.     Breath sounds: Normal breath sounds.  Abdominal:     General: Bowel sounds are normal.     Palpations: Abdomen is soft.  Musculoskeletal:        General: No tenderness. Normal range of motion.     Left shoulder: Normal range of motion.     Cervical back: Normal range of motion and neck supple.     Right lower leg: No edema.     Left lower leg: No edema.  Skin:    General: Skin is warm and dry.  Neurological:     Mental Status: He is alert and oriented to person, place, and time.     Labs reviewed: Basic Metabolic Panel: Recent Labs    04/03/23 0935  NA 138  K 4.6  CL 103  CO2 28  GLUCOSE 104*  BUN 13  CREATININE 0.94  CALCIUM 9.3   Liver Function Tests: Recent Labs    04/03/23 0935  AST 23  ALT 22  BILITOT 0.6  PROT 6.6   No results for input(s): "LIPASE", "AMYLASE" in the last 8760 hours. No results for input(s): "AMMONIA" in the last 8760 hours. CBC: Recent Labs    04/03/23 0935  WBC 6.4  NEUTROABS 4,557  HGB 13.8  HCT 41.8  MCV 93.7   PLT 239   Lipid Panel: Recent Labs    04/03/23 0935  CHOL 149  HDL 54  LDLCALC 80  TRIG 72  CHOLHDL 2.8   TSH: No results for input(s): "TSH" in the last 8760  hours. A1C: Lab Results  Component Value Date   HGBA1C 5.5 03/13/2021     Assessment/Plan 1. Essential hypertension (Primary) -Blood pressure well controlled, goal bp <140/90 Continue current medications and dietary modifications follow metabolic panel - CBC with Differential/Platelet - Complete Metabolic Panel with eGFR - losartan (COZAAR) 50 MG tablet; Take 1 tablet (50 mg total) by mouth daily.  Dispense: 90 tablet; Refill: 3 - hydrochlorothiazide (HYDRODIURIL) 25 MG tablet; Take 1 tablet (25 mg total) by mouth daily.  Dispense: 90 tablet; Refill: 3  2. Iron deficiency anemia secondary to inadequate dietary iron intake -continues on supplement - CBC with Differential/Platelet  3. Vitamin B12 deficiency B12 at goal on last lab, continues supplement   4. Mixed hyperlipidemia LDL goal <70, willfollow up lab today, may need change in medication if not at goal   5. Psychophysiological insomnia -stable at this time - ALPRAZolam (XANAX) 0.5 MG tablet; Take 0.5 tablets (0.25 mg total) by mouth at bedtime as needed for anxiety.  Dispense: 30 tablet; Refill: 2  6. Coronary artery disease involving native coronary artery of native heart without angina pectoris Stable without chest pains - CBC with Differential/Platelet - Lipid panel - Complete Metabolic Panel with eGFR  7. Anxiety Well controlled at this time - escitalopram (LEXAPRO) 10 MG tablet; Take 1 tablet (10 mg total) by mouth daily.  Dispense: 90 tablet; Refill: 3  8. Prostate cancer [facility]) He has not seen a urologist in years, no changes in symptoms. Yearly PSA - PSA   Return in about 6 months (around 04/03/2024) for routine follow up, labs at appt .  Janene Harvey. Biagio Borg [facility] [phone]

## 2023-10-06 ENCOUNTER — Other Ambulatory Visit: Payer: Self-pay | Admitting: Nurse Practitioner

## 2023-10-06 ENCOUNTER — Encounter: Payer: Self-pay | Admitting: Nurse Practitioner

## 2023-10-06 DIAGNOSIS — E782 Mixed hyperlipidemia: Secondary | ICD-10-CM

## 2023-10-06 MED ORDER — ATORVASTATIN CALCIUM 80 MG PO TABS: 80.0000 mg | ORAL_TABLET | Freq: Every day | ORAL | 1 refills | Status: DC

## 2023-10-07 LAB — COMPLETE METABOLIC PANEL WITH GFR
AG Ratio: 1.6 (calc) (ref 1.0–2.5)
ALT: 21 U/L (ref 9–46)
AST: 24 U/L (ref 10–35)
Albumin: 4.1 g/dL (ref 3.6–5.1)
Alkaline phosphatase (APISO): 63 U/L (ref 35–144)
BUN: 18 mg/dL (ref 7–25)
CO2: 28 mmol/L (ref 20–32)
Calcium: 9.1 mg/dL (ref 8.6–10.3)
Chloride: 102 mmol/L (ref 98–110)
Creat: 1 mg/dL (ref 0.70–1.28)
Globulin: 2.5 g/dL (ref 1.9–3.7)
Glucose, Bld: 125 mg/dL — ABNORMAL HIGH (ref 65–99)
Potassium: 4.3 mmol/L (ref 3.5–5.3)
Sodium: 139 mmol/L (ref 135–146)
Total Bilirubin: 0.4 mg/dL (ref 0.2–1.2)
Total Protein: 6.6 g/dL (ref 6.1–8.1)
eGFR: 77 mL/min/{1.73_m2} (ref >=60)

## 2023-10-07 LAB — CBC WITH DIFFERENTIAL/PLATELET
Absolute Lymphocytes: 1210 {cells}/uL (ref 850–3900)
Absolute Monocytes: 497 {cells}/uL (ref 200–950)
Basophils Absolute: 22 {cells}/uL (ref 0–200)
Basophils Relative: 0.3 %
Eosinophils Absolute: 72 {cells}/uL (ref 15–500)
Eosinophils Relative: 1 %
HCT: 38 % — ABNORMAL LOW (ref 38.5–50.0)
Hemoglobin: 12 g/dL — ABNORMAL LOW (ref 13.2–17.1)
MCH: 28.2 pg (ref 27.0–33.0)
MCHC: 31.6 g/dL — ABNORMAL LOW (ref 32.0–36.0)
MCV: 89.4 fL (ref 80.0–100.0)
MPV: 10.4 fL (ref 7.5–12.5)
Monocytes Relative: 6.9 %
Neutro Abs: 5400 {cells}/uL (ref 1500–7800)
Neutrophils Relative %: 75 %
Platelets: 299 10*3/uL (ref 140–400)
RBC: 4.25 10*6/uL (ref 4.20–5.80)
RDW: 14.3 % (ref 11.0–15.0)
Total Lymphocyte: 16.8 %
WBC: 7.2 10*3/uL (ref 3.8–10.8)

## 2023-10-07 LAB — TEST AUTHORIZATION

## 2023-10-07 LAB — LIPID PANEL
Cholesterol: 151 mg/dL (ref <200)
HDL: 50 mg/dL (ref >=40)
LDL Cholesterol (Calc): 80 mg/dL
Non-HDL Cholesterol (Calc): 101 mg/dL (ref <130)
Total CHOL/HDL Ratio: 3 (calc) (ref <5.0)
Triglycerides: 113 mg/dL (ref <150)

## 2023-10-07 LAB — PSA: PSA: 2.45 ng/mL (ref <=4.00)

## 2023-10-07 LAB — IRON,TIBC AND FERRITIN PANEL
%SAT: 11 % — ABNORMAL LOW (ref 20–48)
Ferritin: 13 ng/mL — ABNORMAL LOW (ref 24–380)
Iron: 45 ug/dL — ABNORMAL LOW (ref 50–180)
TIBC: 414 ug/dL (ref 250–425)

## 2023-10-07 LAB — VITAMIN B12: Vitamin B-12: 364 pg/mL (ref 200–1100)

## 2023-10-27 DIAGNOSIS — K080 Exfoliation of teeth due to systemic causes: Secondary | ICD-10-CM | POA: Diagnosis not present

## 2023-11-24 DIAGNOSIS — H524 Presbyopia: Secondary | ICD-10-CM | POA: Diagnosis not present

## 2023-11-30 DIAGNOSIS — H5310 Unspecified subjective visual disturbances: Secondary | ICD-10-CM | POA: Diagnosis not present

## 2024-01-27 DIAGNOSIS — K080 Exfoliation of teeth due to systemic causes: Secondary | ICD-10-CM | POA: Diagnosis not present

## 2024-03-28 ENCOUNTER — Other Ambulatory Visit: Payer: Self-pay | Admitting: Nurse Practitioner

## 2024-03-28 DIAGNOSIS — E782 Mixed hyperlipidemia: Secondary | ICD-10-CM

## 2024-04-04 ENCOUNTER — Encounter: Payer: Self-pay | Admitting: Nurse Practitioner

## 2024-04-04 ENCOUNTER — Ambulatory Visit (INDEPENDENT_AMBULATORY_CARE_PROVIDER_SITE_OTHER): Payer: Medicare Other | Admitting: Nurse Practitioner

## 2024-04-04 VITALS — BP 128/80 | Temp 97.5°F | Resp 64 | Ht 68.0 in | Wt 155.6 lb

## 2024-04-04 DIAGNOSIS — I10 Essential (primary) hypertension: Secondary | ICD-10-CM | POA: Diagnosis not present

## 2024-04-04 DIAGNOSIS — E782 Mixed hyperlipidemia: Secondary | ICD-10-CM

## 2024-04-04 DIAGNOSIS — I2510 Atherosclerotic heart disease of native coronary artery without angina pectoris: Secondary | ICD-10-CM | POA: Diagnosis not present

## 2024-04-04 DIAGNOSIS — E538 Deficiency of other specified B group vitamins: Secondary | ICD-10-CM | POA: Diagnosis not present

## 2024-04-04 DIAGNOSIS — D508 Other iron deficiency anemias: Secondary | ICD-10-CM

## 2024-04-04 DIAGNOSIS — Z720 Tobacco use: Secondary | ICD-10-CM

## 2024-04-04 DIAGNOSIS — C61 Malignant neoplasm of prostate: Secondary | ICD-10-CM

## 2024-04-04 DIAGNOSIS — D649 Anemia, unspecified: Secondary | ICD-10-CM | POA: Diagnosis not present

## 2024-04-04 DIAGNOSIS — F419 Anxiety disorder, unspecified: Secondary | ICD-10-CM | POA: Insufficient documentation

## 2024-04-04 MED ORDER — ALPRAZOLAM 0.25 MG PO TABS: 0.1250 mg | ORAL_TABLET | Freq: Every evening | ORAL | 0 refills | Status: AC | PRN

## 2024-04-04 MED ORDER — ASPIRIN 81 MG PO TBEC
81.0000 mg | DELAYED_RELEASE_TABLET | Freq: Every day | ORAL | Status: AC
Start: 2024-04-04 — End: ?

## 2024-04-04 NOTE — Assessment & Plan Note (Signed)
Follow up cbc

## 2024-04-04 NOTE — Assessment & Plan Note (Signed)
 Blood pressure well controlled, goal bp <140/90 Continue current medications and dietary modifications follow metabolic panel

## 2024-04-04 NOTE — Assessment & Plan Note (Signed)
 Encouraged cessation.

## 2024-04-04 NOTE — Assessment & Plan Note (Signed)
 Not taking supplement at this time No symptoms Follow up lab

## 2024-04-04 NOTE — Patient Instructions (Addendum)
 Start decreasing xanax  to every other night for 2 weeks Then every 3 nights for 2 weeks. Then twice weekly then stop.   IF needed I have called into lower dose to the pharmacy to help with the weaning process- please reach out for any help.    Start ASA EC 81 mg daily

## 2024-04-04 NOTE — Progress Notes (Signed)
 Careteam: Patient Care Team: Caro Harlene POUR, NP as PCP - General (Geriatric Medicine) Jakie Alm SAUNDERS, MD as Consulting Physician (Gastroenterology) Sheldon Standing, MD as Consulting Physician (General Surgery) Devere Lonni Righter, MD as Consulting Physician (Urology)  PLACE OF SERVICE:  [facility]  Advanced Directive information    Allergies  Allergen Reactions   Bupropion Hcl    Shrimp [Shellfish Allergy]     Chief Complaint  Patient presents with   Medical Management of Chronic Issues    6 month follow up  - pt wants to talk to pcp regarding B12 mdication     HPI:  Discussed the use of AI scribe software for clinical note transcription with the patient, who gave verbal consent to proceed.  History of Present Illness Jackson Perry is a 78 year old male with prostate cancer and coronary artery disease who presents for a six-month follow-up.  He has a history of prostate cancer diagnosed five years ago. He chose not to undergo treatment, stating he did not want radiation during the COVID-19 pandemic. He last saw a urologist four years ago and reports no current symptoms related to prostate cancer. Does not wish to follow back up with urology- would not want treatment.   He has coronary artery disease and is currently taking losartan  50 mg and hydrochlorothiazide  25 mg daily for blood pressure, as well as atorvastatin  80 mg for cholesterol. He had stopped taking aspirin . He stopped taking 325 mg aspirin . No chest pain or shortness of breath.   He has a history of vitamin B12 deficiency, previously associated with gallbladder removal affecting absorption. He has not taken vitamin B12 supplements for 3-4 months and is currently asymptomatic. He has a stockpile of vitamin B12 vials.  He smokes cigarettes but is cutting back to three per day. He experiences anxiety and takes alprazolam  (Xanax ) every night, half a tablet, for sleep and anxiety. He is currently on  Lexapro  for anxiety and depression. Feels like he could titrate off some of his medication  No issues with constipation, diarrhea, or atypical pains. He visits the dentist four times a year.    Review of Systems:  Review of Systems  Constitutional:  Negative for chills, fever and weight loss.  HENT:  Negative for tinnitus.   Respiratory:  Negative for cough, sputum production and shortness of breath.   Cardiovascular:  Negative for chest pain, palpitations and leg swelling.  Gastrointestinal:  Negative for abdominal pain, constipation, diarrhea and heartburn.  Genitourinary:  Negative for dysuria, frequency and urgency.  Musculoskeletal:  Negative for back pain, falls, joint pain and myalgias.  Skin: Negative.   Neurological:  Negative for dizziness and headaches.  Psychiatric/Behavioral:  Negative for depression and memory loss. The patient does not have insomnia.     Past Medical History:  Diagnosis Date   Anemia    Arthritis    Depression    High blood pressure    High cholesterol    Panic attacks    Prostate cancer (HCC) 10/14/2018   Past Surgical History:  Procedure Laterality Date   CATARACT EXTRACTION     CHOLECYSTECTOMY     HERNIA REPAIR     TOOTH EXTRACTION     Social History:   reports that he has been smoking cigarettes. He has never used smokeless tobacco. He reports current alcohol use. He reports that he does not use drugs.  Family History  Problem Relation Age of Onset   Heart disease Father  71   Diabetes Father    Cancer Father     Medications: Patient's Medications  New Prescriptions   ALPRAZOLAM  (XANAX ) 0.25 MG TABLET    Take 0.5 tablets (0.125 mg total) by mouth at bedtime as needed for anxiety.   ASPIRIN  EC 81 MG TABLET    Take 1 tablet (81 mg total) by mouth daily. Swallow whole.  Previous Medications   ATORVASTATIN  (LIPITOR) 80 MG TABLET    TAKE 1 TABLET BY MOUTH DAILY   BETAMETHASONE  DIPROPIONATE 0.05 % CREAM    Apply topically 2  (two) times daily.   ESCITALOPRAM  (LEXAPRO ) 10 MG TABLET    Take 1 tablet (10 mg total) by mouth daily.   HYDROCHLOROTHIAZIDE  (HYDRODIURIL ) 25 MG TABLET    Take 1 tablet (25 mg total) by mouth daily.   LOSARTAN  (COZAAR ) 50 MG TABLET    Take 1 tablet (50 mg total) by mouth daily.   MULTIPLE VITAMINS-MINERALS (PRESERVISION AREDS 2 PO)    Take 1 capsule by mouth daily.  Modified Medications   No medications on file  Discontinued Medications   ALPRAZOLAM  (XANAX ) 0.5 MG TABLET    Take 0.5 tablets (0.25 mg total) by mouth at bedtime as needed for anxiety.   ASPIRIN  EC 325 MG TABLET    Take 1 tablet (325 mg total) by mouth daily. Swallow whole.   CYANOCOBALAMIN  (VITAMIN B12) 1000 MCG/ML INJECTION    INJECT 1 ML INTRAMUSCULARLY EVERY 14 DAYS   SYRINGE-NEEDLE, DISP, 3 ML (B-D 3CC LUER-LOK SYR 25GX1) 25G X 1 3 ML MISC    INJECT B12 INTRAMUSCULARLY EVERY 14 DAYS    Physical Exam:  Vitals:   04/04/24 0838  BP: 128/80  Resp: (!) 64  Temp: (!) 97.5 F (36.4 C)  Weight: 155 lb 9.6 oz (70.6 kg)  Height: 5' 8 (1.727 m)   Body mass index is 23.66 kg/m. Wt Readings from Last 3 Encounters:  04/04/24 155 lb 9.6 oz (70.6 kg)  10/05/23 156 lb (70.8 kg)  04/03/23 155 lb (70.3 kg)    Physical Exam Constitutional:      General: He is not in acute distress.    Appearance: He is well-developed. He is not diaphoretic.  HENT:     Head: Normocephalic and atraumatic.     Right Ear: External ear normal.     Left Ear: External ear normal.     Mouth/Throat:     Pharynx: No oropharyngeal exudate.  Eyes:     Conjunctiva/sclera: Conjunctivae normal.     Pupils: Pupils are equal, round, and reactive to light.  Cardiovascular:     Rate and Rhythm: Normal rate and regular rhythm.     Heart sounds: Normal heart sounds.  Pulmonary:     Effort: Pulmonary effort is normal.     Breath sounds: Normal breath sounds.  Abdominal:     General: Bowel sounds are normal.     Palpations: Abdomen is soft.   Musculoskeletal:        General: No tenderness.     Cervical back: Normal range of motion and neck supple.     Right lower leg: No edema.     Left lower leg: No edema.  Skin:    General: Skin is warm and dry.  Neurological:     Mental Status: He is alert and oriented to person, place, and time.     Labs reviewed: Basic Metabolic Panel: Recent Labs    10/05/23 0845  NA 139  K 4.3  CL  102  CO2 28  GLUCOSE 125*  BUN 18  CREATININE 1.00  CALCIUM  9.1   Liver Function Tests: Recent Labs    10/05/23 0845  AST 24  ALT 21  BILITOT 0.4  PROT 6.6   No results for input(s): LIPASE, AMYLASE in the last 8760 hours. No results for input(s): AMMONIA in the last 8760 hours. CBC: Recent Labs    10/05/23 0845  WBC 7.2  NEUTROABS 5,400  HGB 12.0*  HCT 38.0*  MCV 89.4  PLT 299   Lipid Panel: Recent Labs    10/05/23 0845  CHOL 151  HDL 50  LDLCALC 80  TRIG 113  CHOLHDL 3.0   TSH: No results for input(s): TSH in the last 8760 hours. A1C: Lab Results  Component Value Date   HGBA1C 5.5 03/13/2021     Assessment/Plan  Vitamin B12 deficiency Assessment & Plan: Not taking supplement at this time No symptoms Follow up lab  Orders: -     Vitamin B12  Mixed hyperlipidemia Assessment & Plan: Continues on lipitor 80 mg daily with dietary modifications  Orders: -     Lipid panel -     Comprehensive metabolic panel with GFR  Essential hypertension Assessment & Plan: Blood pressure well controlled, goal bp <140/90 Continue current medications and dietary modifications follow metabolic panel  Orders: -     CBC with Differential/Platelet -     Comprehensive metabolic panel with GFR -     EKG 12-Lead  Iron deficiency anemia secondary to inadequate dietary iron intake Assessment & Plan: Follow up cbc  Orders: -     CBC with Differential/Platelet  Coronary artery disease involving native coronary artery of native heart without angina  pectoris Assessment & Plan: Stable, no chest pain, continues on statin, and encouraged to restart ASA 81 mg daily  Orders: -     Lipid panel -     CBC with Differential/Platelet -     Comprehensive metabolic panel with GFR -     Aspirin ; Take 1 tablet (81 mg total) by mouth daily. Swallow whole.  Prostate cancer [facility]) Assessment & Plan: PSA stable on last labs, check yearly No symptoms at this time and does not wish to have follow up with urology or treatement   Anxiety Assessment & Plan: Controlled, would like to try to wean medications- discussed GDR of xanax , taking  0.5 mg half tablet to = 0.25 mg daily of xanax - dicussed taking every other night for 2 weeks then every 3rd night for 2 weeks then stopping- if having trouble with this titration 0.25 mg tablet called in so he can use half to = 0.125 to titrate  Orders: -     ALPRAZolam ; Take 0.5 tablets (0.125 mg total) by mouth at bedtime as needed for anxiety.  Dispense: 15 tablet; Refill: 0  Tobacco abuse Assessment & Plan: Encouraged cessation      Return in about 6 months (around 10/05/2024) for routine follow up.  Kolbe Delmonaco K. Caro BODILY [facility] [phone]

## 2024-04-04 NOTE — Assessment & Plan Note (Signed)
 Controlled, would like to try to wean medications- discussed GDR of xanax , taking  0.5 mg half tablet to = 0.25 mg daily of xanax - dicussed taking every other night for 2 weeks then every 3rd night for 2 weeks then stopping- if having trouble with this titration 0.25 mg tablet called in so he can use half to = 0.125 to titrate

## 2024-04-04 NOTE — Assessment & Plan Note (Signed)
 PSA stable on last labs, check yearly No symptoms at this time and does not wish to have follow up with urology or treatement

## 2024-04-04 NOTE — Assessment & Plan Note (Signed)
 Continues on lipitor 80 mg daily with dietary modifications

## 2024-04-04 NOTE — Assessment & Plan Note (Signed)
 Stable, no chest pain, continues on statin, and encouraged to restart ASA 81 mg daily

## 2024-04-05 ENCOUNTER — Ambulatory Visit: Payer: Self-pay | Admitting: Nurse Practitioner

## 2024-04-06 LAB — COMPREHENSIVE METABOLIC PANEL WITH GFR
AG Ratio: 1.6 (calc) (ref 1.0–2.5)
ALT: 30 U/L (ref 9–46)
AST: 35 U/L (ref 10–35)
Albumin: 4.3 g/dL (ref 3.6–5.1)
Alkaline phosphatase (APISO): 69 U/L (ref 35–144)
BUN: 13 mg/dL (ref 7–25)
CO2: 28 mmol/L (ref 20–32)
Calcium: 9.5 mg/dL (ref 8.6–10.3)
Chloride: 99 mmol/L (ref 98–110)
Creat: 0.96 mg/dL (ref 0.70–1.28)
Globulin: 2.7 g/dL (ref 1.9–3.7)
Glucose, Bld: 90 mg/dL (ref 65–99)
Potassium: 4.8 mmol/L (ref 3.5–5.3)
Sodium: 136 mmol/L (ref 135–146)
Total Bilirubin: 0.6 mg/dL (ref 0.2–1.2)
Total Protein: 7 g/dL (ref 6.1–8.1)
eGFR: 81 mL/min/1.73m2 (ref >=60)

## 2024-04-06 LAB — LIPID PANEL
Cholesterol: 164 mg/dL (ref <200)
HDL: 47 mg/dL (ref >=40)
LDL Cholesterol (Calc): 93 mg/dL
Non-HDL Cholesterol (Calc): 117 mg/dL (ref <130)
Total CHOL/HDL Ratio: 3.5 (calc) (ref <5.0)
Triglycerides: 142 mg/dL (ref <150)

## 2024-04-06 LAB — CBC WITH DIFFERENTIAL/PLATELET
Absolute Lymphocytes: 1305 {cells}/uL (ref 850–3900)
Absolute Monocytes: 507 {cells}/uL (ref 200–950)
Basophils Absolute: 40 {cells}/uL (ref 0–200)
Basophils Relative: 0.7 %
Eosinophils Absolute: 80 {cells}/uL (ref 15–500)
Eosinophils Relative: 1.4 %
HCT: 36.5 % — ABNORMAL LOW (ref 38.5–50.0)
Hemoglobin: 11.2 g/dL — ABNORMAL LOW (ref 13.2–17.1)
MCH: 28.4 pg (ref 27.0–33.0)
MCHC: 30.7 g/dL — ABNORMAL LOW (ref 32.0–36.0)
MCV: 92.6 fL (ref 80.0–100.0)
MPV: 9.7 fL (ref 7.5–12.5)
Monocytes Relative: 8.9 %
Neutro Abs: 3768 {cells}/uL (ref 1500–7800)
Neutrophils Relative %: 66.1 %
Platelets: 258 Thousand/uL (ref 140–400)
RBC: 3.94 Million/uL — ABNORMAL LOW (ref 4.20–5.80)
RDW: 16.6 % — ABNORMAL HIGH (ref 11.0–15.0)
Total Lymphocyte: 22.9 %
WBC: 5.7 Thousand/uL (ref 3.8–10.8)

## 2024-04-06 LAB — IRON,TIBC AND FERRITIN PANEL
%SAT: 11 % — ABNORMAL LOW (ref 20–48)
Ferritin: 18 ng/mL — ABNORMAL LOW (ref 24–380)
Iron: 50 ug/dL (ref 50–180)
TIBC: 436 ug/dL — ABNORMAL HIGH (ref 250–425)

## 2024-04-06 LAB — VITAMIN B12: Vitamin B-12: 327 pg/mL (ref 200–1100)

## 2024-04-06 LAB — TEST AUTHORIZATION

## 2024-05-10 DIAGNOSIS — K080 Exfoliation of teeth due to systemic causes: Secondary | ICD-10-CM | POA: Diagnosis not present

## 2024-06-13 DIAGNOSIS — K080 Exfoliation of teeth due to systemic causes: Secondary | ICD-10-CM | POA: Diagnosis not present

## 2024-06-14 ENCOUNTER — Encounter: Payer: Medicare Other | Admitting: Nurse Practitioner

## 2024-06-15 DIAGNOSIS — K080 Exfoliation of teeth due to systemic causes: Secondary | ICD-10-CM | POA: Diagnosis not present

## 2024-06-24 ENCOUNTER — Ambulatory Visit (INDEPENDENT_AMBULATORY_CARE_PROVIDER_SITE_OTHER): Admitting: Nurse Practitioner

## 2024-06-24 ENCOUNTER — Encounter: Payer: Self-pay | Admitting: Nurse Practitioner

## 2024-06-24 DIAGNOSIS — Z0000 Encounter for general adult medical examination without abnormal findings: Secondary | ICD-10-CM

## 2024-06-24 NOTE — Progress Notes (Signed)
 Subjective:   Jackson Perry is a 78 y.o. male who presents for Medicare Annual/Subsequent preventive examination.  Visit Complete: Virtual I connected with  Renaud Celli Forgey on 06/24/24 by a video and audio enabled telemedicine application and verified that I am speaking with the correct person using two identifiers.  Patient Location: Home  Provider Location: Office/Clinic  I discussed the limitations of evaluation and management by telemedicine. The patient expressed understanding and agreed to proceed.  Vital Signs: Because this visit was a virtual/telehealth visit, some criteria may be missing or patient reported. Any vitals not documented were not able to be obtained and vitals that have been documented are patient reported.   Cardiac Risk Factors include: male gender;dyslipidemia     Objective:    Today's Vitals   06/24/24 9077  PainSc: 2    There is no height or weight on file to calculate BMI.     10/05/2023    8:20 AM 04/03/2023    8:18 AM 09/22/2022    8:13 AM 09/18/2022    9:27 AM 05/16/2022    3:11 PM 03/10/2022    8:01 AM 05/14/2021   11:37 AM  Advanced Directives  Does Patient Have a Medical Advance Directive? Yes Yes Yes Yes Yes Yes Yes  Type of Estate agent of [place];Living will Healthcare Power of [place];Living will Healthcare Power of [place];Living will;Out of facility DNR (pink MOST or yellow form) Healthcare Power of [employer] Power of [employer] Power of [employer] Power of Attorney  Does patient want to make changes to medical advance directive? No - Patient declined No - Patient declined No - Patient declined No - Patient declined No - Patient declined No - Patient declined No - Patient declined  Copy of Healthcare Power of Attorney in Chart? No - copy requested No - copy requested Yes - validated most recent copy scanned in chart (See row information) Yes - validated most recent copy scanned in chart (See  row information) Yes - validated most recent copy scanned in chart (See row information) Yes - validated most recent copy scanned in chart (See row information)     Current Medications (verified) Outpatient Encounter Medications as of 06/24/2024  Medication Sig   ALPRAZolam  (XANAX ) 0.25 MG tablet Take 0.25 mg by mouth as needed for sleep or anxiety.   aspirin  EC 81 MG tablet Take 1 tablet (81 mg total) by mouth daily. Swallow whole.   atorvastatin  (LIPITOR) 80 MG tablet TAKE 1 TABLET BY MOUTH DAILY   betamethasone  dipropionate 0.05 % cream Apply topically 2 (two) times daily.   escitalopram  (LEXAPRO ) 10 MG tablet Take 1 tablet (10 mg total) by mouth daily.   ferrous sulfate 325 (65 FE) MG tablet Take 325 mg by mouth daily. (with a meal)   hydrochlorothiazide  (HYDRODIURIL ) 25 MG tablet Take 1 tablet (25 mg total) by mouth daily.   losartan  (COZAAR ) 50 MG tablet Take 1 tablet (50 mg total) by mouth daily.   Multiple Vitamins-Minerals (PRESERVISION AREDS 2 PO) Take 1 capsule by mouth daily.   No facility-administered encounter medications on file as of 06/24/2024.    Allergies (verified) Bupropion hcl and Shrimp [shellfish allergy]   History: Past Medical History:  Diagnosis Date   Anemia    Arthritis    Depression    High blood pressure    High cholesterol    Panic attacks    Prostate cancer (HCC) 10/14/2018   Past Surgical History:  Procedure Laterality Date  CATARACT EXTRACTION     CHOLECYSTECTOMY     HERNIA REPAIR     TOOTH EXTRACTION     Family History  Problem Relation Age of Onset   Heart disease Father        35   Diabetes Father    Cancer Father    Social History   Socioeconomic History   Marital status: Married    Spouse name: Not on file   Number of children: Not on file   Years of education: Not on file   Highest education level: Bachelor's degree (e.g., BA, AB, BS)  Occupational History   Not on file  Tobacco Use   Smoking status: Every Day     Types: Cigarettes   Smokeless tobacco: Never   Tobacco comments:    2 per day, sometimes patient will go 1 week or longer without any   Vaping Use   Vaping status: Never Used  Substance and Sexual Activity   Alcohol use: Yes    Comment: 2 drinks daily- [place]   Drug use: Never   Sexual activity: Not Currently  Other Topics Concern   Not on file  Social History Narrative   Diet: Blank      Do you drink/ eat things with caffeine?  Coffee      Marital status:    M                           What year were you married ? 1969      Do you live in a house, apartment,assistred living, condo, trailer, etc.)? Condo      Is it one or more stories? 1      How many persons live in your home ? 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   Yes                           Type & how often 1 and 1/2- 2 mile brisk walk      ADVANCED DIRECTIVES (Please bring copies)      Do you have a living will? Yes      Do you have a DNR form?  Yes                     If not, do you want to discuss one?       Do you have signed POA?HPOA forms?   Yes              If so, please bring to your appointment      FUNCTIONAL STATUS- To be completed by Spouse / child / Staff       Do you have difficulty bathing or dressing yourself ?  No      Do you have difficulty preparing food or eating ? No      Do you have difficulty managing your mediation ? No      Do you have difficulty managing your finances ? No      Do you have difficulty affording your medication ? No      Social Drivers of Corporate investment banker  Strain: Low Risk  (06/20/2024)   Overall Financial Resource Strain (CARDIA)    Difficulty of Paying Living Expenses: Not hard at all  Food Insecurity: No Food Insecurity (06/20/2024)   Hunger Vital Sign    Worried About Running Out of  Food in the Last Year: Never true    Ran Out of Food in the Last Year: Never true  Transportation Needs: No Transportation Needs (06/20/2024)   PRAPARE - Administrator, Civil Service (Medical): No    Lack of Transportation (Non-Medical): No  Physical Activity: Insufficiently Active (06/20/2024)   Exercise Vital Sign    Days of Exercise per Week: 2 days    Minutes of Exercise per Session: 30 min  Stress: Stress Concern Present (06/20/2024)   [employer] of Occupational Health - Occupational Stress Questionnaire    Feeling of Stress: To some extent  Social Connections: Socially Integrated (06/20/2024)   Social Connection and Isolation Panel    Frequency of Communication with Friends and Family: More than three times a week    Frequency of Social Gatherings with Friends and Family: Patient declined    Attends Religious Services: More than 4 times per year    Active Member of [employer] or Organizations: Yes    Attends Engineer, structural: More than 4 times per year    Marital Status: Married    Tobacco Counseling Ready to quit: Not Answered Counseling given: Not Answered Tobacco comments: 2 per day, sometimes patient will go 1 week or longer without any    Clinical Intake:  Pre-visit preparation completed: Yes  Pain : 0-10 Pain Score: 2  Pain Type: Chronic pain Pain Orientation: Lower Pain Onset: More than a month ago Pain Frequency: Constant     BMI - recorded: 23  How often do you need to have someone help you when you read instructions, pamphlets, or other written materials from your doctor or pharmacy?: 1 - Never         Activities of Daily Living    06/24/2024    9:21 AM 06/20/2024    5:55 PM  In your present state of health, do you have any difficulty performing the following activities:  Hearing? 0 0  Vision? 0 0  Difficulty concentrating or making decisions? 0 0  Walking or climbing stairs? 0 0  Dressing or bathing? 0 0  Doing  errands, shopping? 0 0  Preparing Food and eating ? N N  Using the Toilet? N N  In the past six months, have you accidently leaked urine? N N  Do you have problems with loss of bowel control? N   Managing your Medications? N N  Managing your Finances? N N  Housekeeping or managing your Housekeeping? N N    Patient Care Team: Caro Harlene POUR, NP as PCP - General (Geriatric Medicine) Jakie Alm SAUNDERS, MD as Consulting Physician (Gastroenterology) Sheldon Standing, MD as Consulting Physician (General Surgery) Devere Lonni Righter, MD as Consulting Physician (Urology)  Indicate any recent Medical Services you may have received from other than [facility] providers in the past year (date may be approximate).     Assessment:   This is a routine wellness examination for Remijio.  Hearing/Vision screen No results found.   Goals Addressed   None    Depression Screen    06/24/2024    8:54 AM 09/22/2022    8:12 AM 08/11/2022    9:22 AM 05/27/2022    8:16 AM 03/10/2022    8:25  AM 05/14/2021   11:35 AM 09/13/2020    1:20 PM  PHQ 2/9 Scores  PHQ - 2 Score 0 0 0 0 0 0 2  PHQ- 9 Score       4    Fall Risk    06/24/2024    9:26 AM 06/20/2024    5:55 PM 10/05/2023    8:19 AM 06/10/2023    2:00 PM 04/03/2023    8:08 AM  Fall Risk   Falls in the past year? 0 1 1 0 0  Number falls in past yr:   1 0 0  Injury with Fall?   0 0 0  Risk for fall due to :   History of fall(s) No Fall Risks No Fall Risks  Follow up   Falls evaluation completed Falls evaluation completed Falls evaluation completed    MEDICARE RISK AT HOME: Medicare Risk at Home Any stairs in or around the home?: No Home free of loose throw rugs in walkways, pet beds, electrical cords, etc?: Yes Adequate lighting in your home to reduce risk of falls?: Yes Life alert?: No Use of a cane, walker or w/c?: No Grab bars in the bathroom?: Yes Shower chair or bench in shower?: Yes Elevated toilet seat or a handicapped toilet?:  No  TIMED UP AND GO:  Was the test performed?  No    Cognitive Function:        06/24/2024    8:52 AM 06/10/2023    2:00 PM 05/27/2022    8:13 AM 05/14/2021   11:37 AM 11/22/2019   11:10 AM  6CIT Screen  What Year? 0 points 0 points 0 points 0 points 0 points  What month? 0 points 0 points 0 points 0 points 0 points  What time? 0 points 0 points 0 points 0 points --  Count back from 20 0 points 0 points 0 points 0 points 0 points  Months in reverse 0 points 0 points 0 points 0 points 0 points  Repeat phrase 4 points 0 points 0 points 4 points 0 points  Total Score 4 points 0 points 0 points 4 points     Immunizations Immunization History  Administered Date(s) Administered   Fluad Quad(high Dose 65+) 04/29/2019   INFLUENZA, HIGH DOSE SEASONAL PF 07/26/2018, 06/06/2020, 05/28/2021, 07/12/2022, 05/12/2023   Influenza,inj,Quad PF,6+ Mos 07/10/2015, 05/22/2016, 07/25/2017   Influenza-Unspecified 06/23/2024   Moderna Covid-19 Fall Seasonal Vaccine 22yrs & older 07/12/2022, 06/23/2024   PFIZER(Purple Top)SARS-COV-2 Vaccination 09/27/2019, 10/18/2019, 05/12/2020, 10/25/2020   Pfizer Comirnaty Covid-19 vaccine 57yrs-93yrs 05/12/2023   Pfizer Covid-19 Vaccine Bivalent Booster 62yrs & up 07/03/2021, 02/23/2022   Pneumococcal Conjugate-13 07/03/2016   Pneumococcal Polysaccharide-23 02/04/2013   RSV,unspecified 07/25/2022, 09/24/2022   Tdap 03/28/2020   Tetanus 02/04/2013   Unspecified SARS-COV-2 Vaccination 07/20/2022   Zoster Recombinant(Shingrix) 07/25/2022, 12/11/2022    TDAP status: Up to date  Flu Vaccine status: Up to date  Pneumococcal vaccine status: Up to date  Covid-19 vaccine status: Information provided on how to obtain vaccines.   Qualifies for Shingles Vaccine? Yes   Zostavax completed No   Shingrix Completed?: Yes  Screening Tests Health Maintenance  Topic Date Due   COVID-19 Vaccine (9 - Pfizer risk 2025-26 season) 12/22/2024   Medicare Annual Wellness  (AWV)  06/24/2025   DTaP/Tdap/Td (2 - Td or Tdap) 03/28/2030   Pneumococcal Vaccine: 50+ Years  Completed   Influenza Vaccine  Completed   Hepatitis C Screening  Completed   Zoster Vaccines- Shingrix  Completed   Meningococcal B Vaccine  Aged Out   Colonoscopy  Discontinued    Health Maintenance  There are no preventive care reminders to display for this patient.   Colorectal cancer screening: No longer required.   Lung Cancer Screening: (Low Dose CT Chest recommended if Age 1-80 years, 20 pack-year currently smoking OR have quit w/in 15years.) does qualify.   Lung Cancer Screening Referral: declines screening.   Additional Screening:  Hepatitis C Screening: does qualify; Completed   Vision Screening: Recommended annual ophthalmology exams for early detection of glaucoma and other disorders of the eye. Is the patient up to date with their annual eye exam?  Yes  Who is the provider or what is the name of the office in which the patient attends annual eye exams? Dr Waylan  If pt is not established with a provider, would they like to be referred to a provider to establish care? No .   Dental Screening: Recommended annual dental exams for proper oral hygiene   Community Resource Referral / Chronic Care Management: CRR required this visit?  No   CCM required this visit?  No     Plan:     I have personally reviewed and noted the following in the patient's chart:   Medical and social history Use of alcohol, tobacco or illicit drugs  Current medications and supplements including opioid prescriptions. Patient is not currently taking opioid prescriptions. Functional ability and status Nutritional status Physical activity Advanced directives List of other physicians Hospitalizations, surgeries, and ER visits in previous 12 months Vitals Screenings to include cognitive, depression, and falls Referrals and appointments  In addition, I have reviewed and discussed with  patient certain preventive protocols, quality metrics, and best practice recommendations. A written personalized care plan for preventive services as well as general preventive health recommendations were provided to patient.     Harlene MARLA An, NP   06/24/2024   After Visit Summary: (MyChart) Due to this being a telephonic visit, the after visit summary with patients personalized plan was offered to patient via MyChart

## 2024-06-24 NOTE — Patient Instructions (Signed)
  [name] , Thank you for taking time to come for your Medicare Wellness Visit. I appreciate your ongoing commitment to your health goals. Please review the following plan we discussed and let me know if I can assist you in the future.     This is a list of the screening recommended for you and due dates:  Health Maintenance  Topic Date Due   COVID-19 Vaccine (9 - Pfizer risk 2025-26 season) 12/22/2024   Medicare Annual Wellness Visit  06/24/2025   DTaP/Tdap/Td vaccine (2 - Td or Tdap) 03/28/2030   Pneumococcal Vaccine for age over 20  Completed   Flu Shot  Completed   Hepatitis C Screening  Completed   Zoster (Shingles) Vaccine  Completed   Meningitis B Vaccine  Aged Out   Colon Cancer Screening  Discontinued

## 2024-06-24 NOTE — Progress Notes (Signed)
 This service is provided via telemedicine  No vital signs collected/recorded due to the encounter was a telemedicine visit.   Location of patient (ex: home, work):  home  Patient consents to a telephone visit: yes  Location of the provider (ex: office, home):  [facility]   Name of any referring provider:  N/A  Names of all persons participating in the telemedicine service and their role in the encounter:  Evertt Pereyra CMA, Caro Raisin NP and Patient Time spent on call: 9.81mins

## 2024-06-28 ENCOUNTER — Other Ambulatory Visit: Payer: Self-pay | Admitting: Nurse Practitioner

## 2024-06-28 DIAGNOSIS — F5104 Psychophysiologic insomnia: Secondary | ICD-10-CM

## 2024-06-28 NOTE — Telephone Encounter (Signed)
 Patient has request for refill on 06/28/2024. Patient last refill was 05/30/2024 Patient has contract on file dated 04/07/2023. Patient has upcoming appointment 10/07/2024 Updated contract/Sign contract was added to appointment notes. Medication pend and sent to PCP [facility], Jessica K, NP) for approval.   Medication on pts med list also states  historical provider  However , in the rx notes it state that medication was discontinued 04/04/2024. Sending to patients pcp for further review.

## 2024-06-30 DIAGNOSIS — K080 Exfoliation of teeth due to systemic causes: Secondary | ICD-10-CM | POA: Diagnosis not present

## 2024-07-10 ENCOUNTER — Other Ambulatory Visit: Payer: Self-pay | Admitting: Nurse Practitioner

## 2024-07-10 DIAGNOSIS — E538 Deficiency of other specified B group vitamins: Secondary | ICD-10-CM

## 2024-08-28 ENCOUNTER — Other Ambulatory Visit: Payer: Self-pay | Admitting: Nurse Practitioner

## 2024-08-29 NOTE — Telephone Encounter (Signed)
 Patient is requesting a refill of the following medications: Requested Prescriptions   Pending Prescriptions Disp Refills   ALPRAZolam  (XANAX ) 0.25 MG tablet [Pharmacy Med Name: ALPRAZolam  0.25 MG TABLET] 15 tablet     Sig: TAKE 1/2 TABLET BY MOUTH EVERY NIGHT AT BEDTIME AS NEEDED FOR ANXIETY    Date of last refill: 06/28/2024  Refill amount: 15 with 1 refill  Treatment agreement date: 04/03/2023

## 2024-09-14 ENCOUNTER — Ambulatory Visit: Payer: Self-pay

## 2024-09-14 NOTE — Telephone Encounter (Signed)
 Attempted to contact pt but NA. LM for pt to return call to clinic. Attempt #1   Message from [place] C sent at 09/14/2024  4:18 PM EST  Reason for Triage: Patient states that his knee has been hurting and in pain for some weeks now and it does not go away. Everytime he uses it, he loses his balance. Unsure if he needs to see an orthro or not

## 2024-09-15 ENCOUNTER — Encounter: Payer: Self-pay | Admitting: Family

## 2024-09-15 ENCOUNTER — Ambulatory Visit: Admitting: Family

## 2024-09-15 VITALS — BP 122/68 | HR 76 | Temp 98.0°F | Resp 18 | Ht 68.0 in | Wt 151.0 lb

## 2024-09-15 DIAGNOSIS — M25561 Pain in right knee: Secondary | ICD-10-CM

## 2024-09-15 DIAGNOSIS — G8929 Other chronic pain: Secondary | ICD-10-CM

## 2024-09-15 DIAGNOSIS — M545 Low back pain, unspecified: Secondary | ICD-10-CM | POA: Diagnosis not present

## 2024-09-15 MED ORDER — MELOXICAM 15 MG PO TABS: 15.0000 mg | ORAL_TABLET | Freq: Every day | ORAL | 0 refills | Status: DC

## 2024-09-15 NOTE — Telephone Encounter (Signed)
 He can go to the orthopedics- he does not need a referral from us  unless he just wants one- he can cal himself to get appt.

## 2024-09-15 NOTE — Patient Instructions (Signed)
-   Please get right knee  X-ray at [facility] then will call you with results.

## 2024-09-19 ENCOUNTER — Other Ambulatory Visit: Payer: Self-pay | Admitting: Nurse Practitioner

## 2024-09-19 DIAGNOSIS — E782 Mixed hyperlipidemia: Secondary | ICD-10-CM

## 2024-09-20 ENCOUNTER — Ambulatory Visit
Admission: RE | Admit: 2024-09-20 | Discharge: 2024-09-20 | Disposition: A | Source: Ambulatory Visit | Attending: Family | Admitting: Family

## 2024-09-20 DIAGNOSIS — G8929 Other chronic pain: Secondary | ICD-10-CM

## 2024-09-25 NOTE — Progress Notes (Signed)
 "  Provider: Nylee Barbuto FNP-C  Caro Harlene POUR, NP  Patient Care Team: Caro Harlene POUR, NP as PCP - General (Geriatric Medicine) Jakie Alm SAUNDERS, MD as Consulting Physician (Gastroenterology) Sheldon Standing, MD as Consulting Physician (General Surgery) Devere Lonni Righter, MD as Consulting Physician (Urology)  Extended Emergency Contact Information Primary Emergency Contact: Advani,Ruth Address: [address]          [place] [address] [place] Home Phone: [phone] Mobile Phone: [phone] Relation: Spouse Secondary Emergency Contact: Almond,Ben  [place] Mobile Phone: [phone] Relation: Son  Code Status: Full code Goals of care: Advanced Directive information    10/05/2023    8:20 AM  Advanced Directives  Does Patient Have a Medical Advance Directive? Yes  Type of Estate Agent of [place];Living will  Does patient want to make changes to medical advance directive? No - Patient declined  Copy of Healthcare Power of Attorney in Chart? No - copy requested     Chief Complaint  Patient presents with   Knee Pain    Right knee pain barely able to stand up     History of Present Illness   Jackson Perry is a 79 year old male who presents with worsening right knee and lower back pain.  He has been experiencing pain in his right knee and lower back for the past one to two months, with the pain progressively worsening. The knee pain is localized on the right side, painful to touch, but not swollen or red. He notes difficulty walking due to the pain and needs to steady himself when getting up. He has not taken any medication for the knee pain yet.  He has a history of knee pain, including in the left knee, although it is not as severe as the right. He experiences popping or cracking sounds in the knees, described as sounding like '[employer]acker Jacks'.  The lower back pain started concurrently with  the knee pain, which he attributes to altered walking patterns. No issues with urination aside from prolonged time to urinate, which he attributes to age.  He has not used any medication for the knee pain but mentions having Voltaren  gel, which he states 'doesn't touch it'.  He lives in [place] and recently traveled to [place]  for Thanksgiving, after which he and his wife were sick for a month.    Past Medical History:  Diagnosis Date   Anemia    Arthritis    Depression    High blood pressure    High cholesterol    Panic attacks    Prostate cancer (HCC) 10/14/2018   Past Surgical History:  Procedure Laterality Date   CATARACT EXTRACTION     CHOLECYSTECTOMY     HERNIA REPAIR     TOOTH EXTRACTION      Allergies[1]  Outpatient Encounter Medications as of 09/15/2024  Medication Sig   ALPRAZolam  (XANAX ) 0.25 MG tablet TAKE 1/2 TABLET BY MOUTH EVERY NIGHT AT BEDTIME AS NEEDED FOR ANXIETY   aspirin  EC 81 MG tablet Take 1 tablet (81 mg total) by mouth daily. Swallow whole.   betamethasone  dipropionate 0.05 % cream Apply topically 2 (two) times daily.   escitalopram  (LEXAPRO ) 10 MG tablet Take 1 tablet (10 mg total) by mouth daily.   ferrous sulfate 325 (65 FE) MG tablet Take 325 mg by mouth daily. (with a meal)   hydrochlorothiazide  (HYDRODIURIL ) 25 MG tablet Take 1 tablet (25 mg total) by mouth daily.  losartan  (COZAAR ) 50 MG tablet Take 1 tablet (50 mg total) by mouth daily.   meloxicam  (MOBIC ) 15 MG tablet Take 1 tablet (15 mg total) by mouth daily.   Multiple Vitamins-Minerals (PRESERVISION AREDS 2 PO) Take 1 capsule by mouth daily.   [DISCONTINUED] atorvastatin  (LIPITOR) 80 MG tablet TAKE 1 TABLET BY MOUTH DAILY   No facility-administered encounter medications on file as of 09/15/2024.    Review of Systems  Constitutional:  Negative for appetite change, chills, fatigue, fever and unexpected weight change.  HENT:  Negative for congestion, dental problem, ear discharge, ear  pain, facial swelling, hearing loss, nosebleeds, postnasal drip, rhinorrhea, sinus pressure, sinus pain, sneezing, sore throat, tinnitus and trouble swallowing.   Eyes:  Negative for pain, discharge, redness, itching and visual disturbance.  Respiratory:  Negative for cough, chest tightness, shortness of breath and wheezing.   Cardiovascular:  Negative for chest pain, palpitations and leg swelling.  Gastrointestinal:  Negative for abdominal distention, abdominal pain, constipation, diarrhea, nausea and vomiting.  Genitourinary:  Negative for difficulty urinating, dysuria, flank pain, frequency and urgency.  Musculoskeletal:  Positive for arthralgias and back pain. Negative for gait problem, joint swelling, myalgias, neck pain and neck stiffness.       Right knee pain  Skin:  Negative for color change, pallor and rash.  Neurological:  Negative for dizziness, weakness, light-headedness, numbness and headaches.  Psychiatric/Behavioral:  Negative for agitation, behavioral problems, confusion and sleep disturbance.     Immunization History  Administered Date(s) Administered   Fluad Quad(high Dose 65+) 04/29/2019   INFLUENZA, HIGH DOSE SEASONAL PF 07/26/2018, 06/06/2020, 05/28/2021, 07/12/2022, 05/12/2023   Influenza,inj,Quad PF,6+ Mos 07/10/2015, 05/22/2016, 07/25/2017   Influenza-Unspecified 06/23/2024   Moderna Covid-19 Fall Seasonal Vaccine 21yrs & older 07/12/2022, 06/23/2024   PFIZER(Purple Top)SARS-COV-2 Vaccination 09/27/2019, 10/18/2019, 05/12/2020, 10/25/2020   Pfizer Comirnaty Covid-19 vaccine 62yrs-48yrs 05/12/2023   Pfizer Covid-19 Vaccine Bivalent Booster 60yrs & up 07/03/2021, 02/23/2022   Pneumococcal Conjugate-13 07/03/2016   Pneumococcal Polysaccharide-23 02/04/2013   RSV,unspecified 07/25/2022, 09/24/2022   Tdap 03/28/2020   Tetanus 02/04/2013   Unspecified SARS-COV-2 Vaccination 07/20/2022   Zoster Recombinant(Shingrix) 07/25/2022, 12/11/2022   Pertinent  Health  Maintenance Due  Topic Date Due   Influenza Vaccine  Completed   Colonoscopy  Discontinued      04/03/2023    8:08 AM 06/10/2023    2:00 PM 10/05/2023    8:19 AM 06/20/2024    5:55 PM 06/24/2024    9:26 AM  Fall Risk  Falls in the past year? 0 0 1 1  0  Was there an injury with Fall? 0  0  0     Fall Risk Category Calculator 0 0 2    Patient at Risk for Falls Due to No Fall Risks No Fall Risks History of fall(s)    Fall risk Follow up Falls evaluation completed Falls evaluation completed Falls evaluation completed       Manually entered by patient   Data saved with a previous flowsheet row definition   Functional Status Survey:    Vitals:   09/15/24 1433  BP: 122/68  Pulse: 76  Resp: 18  Temp: 98 F (36.7 C)  SpO2: 93%  Weight: 151 lb (68.5 kg)  Height: 5' 8 (1.727 m)   Body mass index is 22.96 kg/m. Physical Exam  GENERAL: Alert, cooperative, well developed, no acute distress. HEENT: Normocephalic, normal oropharynx, moist mucous membranes. CHEST: Clear to auscultation bilaterally, no wheezes, rhonchi, or crackles. CARDIOVASCULAR: Normal heart rate  and rhythm, S1 and S2 normal without murmurs. ABDOMEN: Soft, non-tender, non-distended, without organomegaly, normal bowel sounds. EXTREMITIES: No cyanosis or edema. MUSCULOSKELETAL: Right knee pain present on adduction, crepitus present in both knees, no effusion in right knee, left knee pain absent. NEUROLOGICAL: Cranial nerves grossly intact, moves all extremities without gross motor or sensory deficit.  SKIN: No rash,no lesion or erythema   PSYCHIATRY/BEHAVIORAL: Mood stable   Labs reviewed: Recent Labs    10/05/23 0845 04/04/24 0919  NA 139 136  K 4.3 4.8  CL 102 99  CO2 28 28  GLUCOSE 125* 90  BUN 18 13  CREATININE 1.00 0.96  CALCIUM  9.1 9.5   Recent Labs    10/05/23 0845 04/04/24 0919  AST 24 35  ALT 21 30  BILITOT 0.4 0.6  PROT 6.6 7.0   Recent Labs    10/05/23 0845 04/04/24 0919  WBC 7.2  5.7  NEUTROABS 5,400 3,768  HGB 12.0* 11.2*  HCT 38.0* 36.5*  MCV 89.4 92.6  PLT 299 258   Lab Results  Component Value Date   TSH 2.65 09/16/2021   Lab Results  Component Value Date   HGBA1C 5.5 03/13/2021   Lab Results  Component Value Date   CHOL 164 04/04/2024   HDL 47 04/04/2024   LDLCALC 93 04/04/2024   LDLDIRECT 150 (H) 07/26/2018   TRIG 142 04/04/2024   CHOLHDL 3.5 04/04/2024    Significant Diagnostic Results in last 30 days:  DG Knee Complete 4 Views Right Result Date: 09/24/2024 CLINICAL DATA:  Right knee pain. EXAM: RIGHT KNEE - COMPLETE 4+ VIEW COMPARISON:  None Available. FINDINGS: No evidence of fracture, dislocation, or joint effusion. Minimal medial tibiofemoral joint space narrowing. Trace patellofemoral peripheral spurring. Mild chronic appearing fragmentation of the tibial spines. Small quadriceps tendon enthesophyte. Arterial vascular calcifications are seen. IMPRESSION: Minimal osteoarthritis. No acute findings. Electronically Signed   By: Andrea Gasman M.D.   On: 09/24/2024 17:44    Assessment/Plan   Chronic right knee pain 1-2 months, worsening over time. Pain is localized to the right knee with crepitus on movement. No swelling or redness. No prior treatment initiated. - Ordered right knee x-ray at [facility]. - Prescribed meloxicam  for anti-inflammatory effect, advised to take with food to prevent stomach irritation. - Referred to orthopedic specialist, Dr. Dorian, for further evaluation and management. - Advised use of Voltaren  gel and knee brace for stabilization. - Instructed to monitor for redness or swelling, which may indicate fluid accumulation or infection.  Chronic low back pain Likely secondary to altered gait due to right knee pain. No urinary symptoms reported. - Advised on the use of over-the-counter pain relief as needed.   Family/ staff Communication: Reviewed plan of care with patient verbalized understanding  Labs/tests  ordered: None   Next Appointment: Return if symptoms worsen or fail to improve.  Total time: 20 minutes. Greater than 50% of total time spent doing patient education regarding right knee and lower back pain,health maintenance including symptom/medication management.   Cordie Beazley C Duffy Dantonio, NP    [1]  Allergies Allergen Reactions   Bupropion Hcl    Shrimp [Shellfish Allergy]    "

## 2024-09-26 ENCOUNTER — Ambulatory Visit: Payer: Self-pay | Admitting: Family

## 2024-09-26 ENCOUNTER — Other Ambulatory Visit: Payer: Self-pay | Admitting: Nurse Practitioner

## 2024-09-26 DIAGNOSIS — G8929 Other chronic pain: Secondary | ICD-10-CM

## 2024-09-26 DIAGNOSIS — I10 Essential (primary) hypertension: Secondary | ICD-10-CM

## 2024-10-07 ENCOUNTER — Ambulatory Visit: Payer: Self-pay | Admitting: Nurse Practitioner

## 2024-10-07 ENCOUNTER — Encounter: Payer: Self-pay | Admitting: Nurse Practitioner

## 2024-10-07 VITALS — BP 140/80 | HR 86 | Temp 97.3°F | Ht 68.0 in | Wt 153.4 lb

## 2024-10-07 DIAGNOSIS — E782 Mixed hyperlipidemia: Secondary | ICD-10-CM | POA: Diagnosis not present

## 2024-10-07 DIAGNOSIS — G8929 Other chronic pain: Secondary | ICD-10-CM

## 2024-10-07 DIAGNOSIS — I10 Essential (primary) hypertension: Secondary | ICD-10-CM

## 2024-10-07 DIAGNOSIS — M25561 Pain in right knee: Secondary | ICD-10-CM | POA: Diagnosis not present

## 2024-10-07 DIAGNOSIS — M545 Low back pain, unspecified: Secondary | ICD-10-CM | POA: Diagnosis not present

## 2024-10-07 DIAGNOSIS — E538 Deficiency of other specified B group vitamins: Secondary | ICD-10-CM | POA: Diagnosis not present

## 2024-10-07 DIAGNOSIS — F5104 Psychophysiologic insomnia: Secondary | ICD-10-CM

## 2024-10-07 DIAGNOSIS — F419 Anxiety disorder, unspecified: Secondary | ICD-10-CM | POA: Diagnosis not present

## 2024-10-07 DIAGNOSIS — C61 Malignant neoplasm of prostate: Secondary | ICD-10-CM | POA: Diagnosis not present

## 2024-10-07 DIAGNOSIS — D508 Other iron deficiency anemias: Secondary | ICD-10-CM | POA: Diagnosis not present

## 2024-10-07 NOTE — Progress Notes (Signed)
 "   Careteam: Patient Care Team: Jackson Harlene POUR, NP as PCP - General (Geriatric Medicine) Jackson Alm SAUNDERS, MD as Consulting Physician (Gastroenterology) Jackson Standing, MD as Consulting Physician (General Surgery) Jackson Lonni Righter, MD as Consulting Physician (Urology)  PLACE OF SERVICE:  [facility]  Advanced Directive information Does Patient Have a Medical Advance Directive?: Yes, Type of Advance Directive: Healthcare Power of Attorney, Does patient want to make changes to medical advance directive?: No - Patient declined  Allergies[1]  Chief Complaint  Patient presents with   Medical Management of Chronic Issues    6 month follow-up and sign treatment agreement for Xanax    Knee Pain    Pain 2/10, was a 9/10    HPI:  Discussed the use of AI scribe software for clinical note transcription with the patient, who gave verbal consent to proceed.  History of Present Illness Jackson Perry is a 79 year old male here for routine follow up.   He has been experiencing ongoing issues with his right knee. In early January, he noticed a significant worsening of pain, possibly due to an injury or bump. X-rays confirmed minimal osteoarthritis. The pain is localized to the side of the knee and has improved over the past few weeks. He uses a light and massage device, which he finds helpful, and occasionally applies Voltaren  gel. He was prescribed meloxicam , which he takes as needed, noting significant relief, but has not used it in the past week.  He experiences chronic low back pain, which he believes affects his gait, causing him to walk differently. He has not previously engaged in physical therapy for this issue.  For anxiety, he is on Lexapro  and uses alprazolam , half a tablet daily at bedtime. He has experimented with Tylenol PM for sleep.  He monitors his blood pressure at home, which has been stable, though he did not take his medication on the day of the visit due to  planned blood work.  He has a history of low B12 and iron  levels, for which he takes B12 shots. He does not tolerate oral iron  supplements well due to gastrointestinal side effects.  He smokes a quarter pack of cigarettes daily. No chest pain, palpitations, or significant respiratory symptoms currently, though he experienced a month-long respiratory illness after traveling to [place] .  No changes in urinary frequency or flow, and his bowel movements are regular.    Review of Systems:  Review of Systems  Constitutional:  Negative for chills, fever and weight loss.  HENT:  Negative for tinnitus.   Respiratory:  Negative for cough, sputum production and shortness of breath.   Cardiovascular:  Negative for chest pain, palpitations and leg swelling.  Gastrointestinal:  Negative for abdominal pain, constipation, diarrhea and heartburn.  Genitourinary:  Negative for dysuria, frequency and urgency.  Musculoskeletal:  Positive for back pain and joint pain. Negative for falls and myalgias.  Skin: Negative.   Neurological:  Negative for dizziness and headaches.  Psychiatric/Behavioral:  Negative for depression and memory loss. The patient does not have insomnia.     Past Medical History:  Diagnosis Date   Anemia    Arthritis    Cataract 2016   Depression    High blood pressure    High cholesterol    Panic attacks    Prostate cancer (HCC) 10/14/2018   Past Surgical History:  Procedure Laterality Date   CATARACT EXTRACTION     CHOLECYSTECTOMY     EYE SURGERY  2017  HERNIA REPAIR     TOOTH EXTRACTION     Social History:   reports that he has been smoking cigarettes. He has never used smokeless tobacco. He reports current alcohol use. He reports that he does not use drugs.  Family History  Problem Relation Age of Onset   Heart disease Father        81   Diabetes Father    Cancer Father     Medications: Patient's Medications  New Prescriptions   No medications on file   Previous Medications   ALPRAZOLAM  (XANAX ) 0.25 MG TABLET    TAKE 1/2 TABLET BY MOUTH EVERY NIGHT AT BEDTIME AS NEEDED FOR ANXIETY   ASPIRIN  EC 81 MG TABLET    Take 1 tablet (81 mg total) by mouth daily. Swallow whole.   ATORVASTATIN  (LIPITOR) 80 MG TABLET    TAKE 1 TABLET BY MOUTH DAILY   AUGMENTED BETAMETHASONE  DIPROPIONATE (DIPROLENE -AF) 0.05 % CREAM    Apply 1 Application topically 2 (two) times daily as needed.   BETAMETHASONE  DIPROPIONATE 0.05 % CREAM    Apply topically 2 (two) times daily.   ESCITALOPRAM  (LEXAPRO ) 10 MG TABLET    Take 1 tablet (10 mg total) by mouth daily.   FERROUS SULFATE 325 (65 FE) MG TABLET    Take 325 mg by mouth daily. (with a meal)   HYDROCHLOROTHIAZIDE  (HYDRODIURIL ) 25 MG TABLET    Take 1 tablet (25 mg total) by mouth daily.   LOSARTAN  (COZAAR ) 50 MG TABLET    TAKE 1 TABLET BY MOUTH DAILY   MELOXICAM  (MOBIC ) 15 MG TABLET    Take 1 tablet (15 mg total) by mouth daily.   MELOXICAM  (MOBIC ) 15 MG TABLET    Take 15 mg by mouth daily as needed for pain (knee pain).   MULTIPLE VITAMINS-MINERALS (PRESERVISION AREDS 2 PO)    Take 1 capsule by mouth daily.  Modified Medications   No medications on file  Discontinued Medications   No medications on file    Physical Exam:  Vitals:   10/07/24 0715 10/07/24 0842  BP: (!) 142/84 (!) 140/80  Pulse: 86   Temp: (!) 97.3 F (36.3 C)   SpO2: 98%   Weight: 153 lb 6.4 oz (69.6 kg)   Height: 5' 8 (1.727 m)    Body mass index is 23.32 kg/m. Wt Readings from Last 3 Encounters:  10/07/24 153 lb 6.4 oz (69.6 kg)  09/15/24 151 lb (68.5 kg)  04/04/24 155 lb 9.6 oz (70.6 kg)    Physical Exam Constitutional:      General: He is not in acute distress.    Appearance: He is well-developed. He is not diaphoretic.  HENT:     Head: Normocephalic and atraumatic.     Right Ear: External ear normal.     Left Ear: External ear normal.     Mouth/Throat:     Pharynx: No oropharyngeal exudate.  Eyes:     Conjunctiva/sclera:  Conjunctivae normal.     Pupils: Pupils are equal, round, and reactive to light.  Cardiovascular:     Rate and Rhythm: Normal rate and regular rhythm.     Heart sounds: Normal heart sounds.  Pulmonary:     Effort: Pulmonary effort is normal.     Breath sounds: Normal breath sounds.  Abdominal:     General: Bowel sounds are normal.     Palpations: Abdomen is soft.  Musculoskeletal:        General: No tenderness.  Cervical back: Normal range of motion and neck supple.     Right lower leg: No edema.     Left lower leg: No edema.  Skin:    General: Skin is warm and dry.  Neurological:     Mental Status: He is alert and oriented to person, place, and time.     Labs reviewed: Basic Metabolic Panel: Recent Labs    04/04/24 0919  NA 136  K 4.8  CL 99  CO2 28  GLUCOSE 90  BUN 13  CREATININE 0.96  CALCIUM  9.5   Liver Function Tests: Recent Labs    04/04/24 0919  AST 35  ALT 30  BILITOT 0.6  PROT 7.0   No results for input(s): LIPASE, AMYLASE in the last 8760 hours. No results for input(s): AMMONIA in the last 8760 hours. CBC: Recent Labs    04/04/24 0919  WBC 5.7  NEUTROABS 3,768  HGB 11.2*  HCT 36.5*  MCV 92.6  PLT 258   Lipid Panel: Recent Labs    04/04/24 0919  CHOL 164  HDL 47  LDLCALC 93  TRIG 142  CHOLHDL 3.5   TSH: No results for input(s): TSH in the last 8760 hours. A1C: Lab Results  Component Value Date   HGBA1C 5.5 03/13/2021     Assessment/Plan Assessment and Plan Assessment & Plan Right knee osteoarthritis with pain Minimal osteoarthritis confirmed by x-ray. Pain likely due to minor injury. Symptoms improving. - Continue knee brace and Voltaren  gel four times daily. - Use meloxicam  as needed, avoid long-term use. - Ordered physical therapy for knee pain.  Chronic low back pain Possibly exacerbated by altered gait from knee pain. - Ordered physical therapy for low back pain.  Anxiety disorder Managed with Lexapro   and daily alprazolam , risk of dependency noted. - Encouraged reducing alprazolam  to as needed, starting with a quarter tablet. - Discussed risks of long-term alprazolam  use.  Psychophysiological insomnia Managed with alprazolam  at bedtime. Discussed Tylenol PM side effects. - Encouraged good sleep habits, reduce sleep aid reliance. - Advised against Tylenol PM use. -reduce alprazolam  to as needed only   Essential hypertension Slightly elevated blood pressure, possibly due to missed dose. Normal home readings. -continue current regimen with dietary modifications.   Iron  deficiency anemia Previous low iron  levels, oral supplements not tolerated. - Ordered iron  profile. - Consider hematologist referral if deficiency persists.  Vitamin B12 deficiency Managed with B12 injections, previous low levels noted. - Ordered B12 level. - Continue B12 injections.   Hx of prostate cancer PSA stable on last labs, no new symptoms   Return in about 6 months (around 04/06/2025) for routine follow up.  Rakisha Pincock K. Jackson BODILY [facility] [phone]      [1]  Allergies Allergen Reactions   Bupropion Hcl    Shrimp [Shellfish Allergy]    "

## 2024-10-08 LAB — CBC WITH DIFFERENTIAL/PLATELET
Absolute Lymphocytes: 1325 {cells}/uL (ref 850–3900)
Absolute Monocytes: 548 {cells}/uL (ref 200–950)
Basophils Absolute: 37 {cells}/uL (ref 0–200)
Basophils Relative: 0.5 %
Eosinophils Absolute: 96 {cells}/uL (ref 15–500)
Eosinophils Relative: 1.3 %
HCT: 31.1 % — ABNORMAL LOW (ref 39.4–51.1)
Hemoglobin: 9.6 g/dL — ABNORMAL LOW (ref 13.2–17.1)
MCH: 25.3 pg — ABNORMAL LOW (ref 27.0–33.0)
MCHC: 30.9 g/dL — ABNORMAL LOW (ref 31.6–35.4)
MCV: 82.1 fL (ref 81.4–101.7)
MPV: 11.3 fL (ref 7.5–12.5)
Monocytes Relative: 7.4 %
Neutro Abs: 5395 {cells}/uL (ref 1500–7800)
Neutrophils Relative %: 72.9 %
Platelets: 325 10*3/uL (ref 140–400)
RBC: 3.79 Million/uL — ABNORMAL LOW (ref 4.20–5.80)
RDW: 18.5 % — ABNORMAL HIGH (ref 11.0–15.0)
Total Lymphocyte: 17.9 %
WBC: 7.4 10*3/uL (ref 3.8–10.8)

## 2024-10-08 LAB — IRON,TIBC AND FERRITIN PANEL
%SAT: 6 % — ABNORMAL LOW (ref 20–48)
Ferritin: 15 ng/mL — ABNORMAL LOW (ref 24–380)
Iron: 27 ug/dL — ABNORMAL LOW (ref 50–180)
TIBC: 431 ug/dL — ABNORMAL HIGH (ref 250–425)

## 2024-10-08 LAB — COMPREHENSIVE METABOLIC PANEL WITH GFR
AG Ratio: 1.5 (calc) (ref 1.0–2.5)
ALT: 17 U/L (ref 9–46)
AST: 24 U/L (ref 10–35)
Albumin: 4.3 g/dL (ref 3.6–5.1)
Alkaline phosphatase (APISO): 60 U/L (ref 35–144)
BUN: 18 mg/dL (ref 7–25)
CO2: 28 mmol/L (ref 20–32)
Calcium: 9.6 mg/dL (ref 8.6–10.3)
Chloride: 101 mmol/L (ref 98–110)
Creat: 0.97 mg/dL (ref 0.70–1.28)
Globulin: 2.9 g/dL (ref 1.9–3.7)
Glucose, Bld: 92 mg/dL (ref 65–99)
Potassium: 4.2 mmol/L (ref 3.5–5.3)
Sodium: 138 mmol/L (ref 135–146)
Total Bilirubin: 0.4 mg/dL (ref 0.2–1.2)
Total Protein: 7.2 g/dL (ref 6.1–8.1)
eGFR: 79 mL/min/{1.73_m2}

## 2024-10-08 LAB — VITAMIN B12: Vitamin B-12: 260 pg/mL (ref 200–1100)

## 2024-10-08 LAB — PSA: PSA: 3.63 ng/mL

## 2024-10-10 ENCOUNTER — Ambulatory Visit: Payer: Self-pay | Admitting: Nurse Practitioner

## 2024-10-10 DIAGNOSIS — D509 Iron deficiency anemia, unspecified: Secondary | ICD-10-CM

## 2024-10-12 MED ORDER — IRON 28 MG PO TABS: 2.0000 | ORAL_TABLET | Freq: Every day | ORAL | Status: AC

## 2025-04-03 ENCOUNTER — Ambulatory Visit: Admitting: Nurse Practitioner

## 2025-06-27 ENCOUNTER — Ambulatory Visit: Payer: Self-pay | Admitting: Nurse Practitioner
